# Patient Record
Sex: Female | Born: 1965 | Race: White | Hispanic: No | Smoking: Former smoker
Health system: Southern US, Community
[De-identification: ages and names within clinical notes are randomized; demographics above are authoritative.]

## PROBLEM LIST (undated history)

## (undated) DIAGNOSIS — F32A Depression, unspecified: Secondary | ICD-10-CM

## (undated) DIAGNOSIS — M1990 Unspecified osteoarthritis, unspecified site: Secondary | ICD-10-CM

## (undated) DIAGNOSIS — R7303 Prediabetes: Secondary | ICD-10-CM

## (undated) DIAGNOSIS — M549 Dorsalgia, unspecified: Secondary | ICD-10-CM

## (undated) DIAGNOSIS — M47819 Spondylosis without myelopathy or radiculopathy, site unspecified: Secondary | ICD-10-CM

## (undated) DIAGNOSIS — E882 Lipomatosis, not elsewhere classified: Secondary | ICD-10-CM

## (undated) DIAGNOSIS — M797 Fibromyalgia: Secondary | ICD-10-CM

## (undated) DIAGNOSIS — T7840XA Allergy, unspecified, initial encounter: Secondary | ICD-10-CM

## (undated) DIAGNOSIS — H269 Unspecified cataract: Secondary | ICD-10-CM

## (undated) DIAGNOSIS — F419 Anxiety disorder, unspecified: Secondary | ICD-10-CM

## (undated) DIAGNOSIS — G8929 Other chronic pain: Secondary | ICD-10-CM

## (undated) DIAGNOSIS — G43909 Migraine, unspecified, not intractable, without status migrainosus: Secondary | ICD-10-CM

## (undated) HISTORY — PX: BACK SURGERY: SHX140

## (undated) HISTORY — PX: DENTAL SURGERY: SHX609

## (undated) HISTORY — PX: SPINAL FUSION: SHX223

## (undated) HISTORY — DX: Allergy, unspecified, initial encounter: T78.40XA

## (undated) HISTORY — PX: FRACTURE SURGERY: SHX138

## (undated) HISTORY — PX: APPENDECTOMY: SHX54

## (undated) HISTORY — DX: Unspecified cataract: H26.9

## (undated) HISTORY — DX: Lipomatosis, not elsewhere classified: E88.2

## (undated) HISTORY — DX: Migraine, unspecified, not intractable, without status migrainosus: G43.909

## (undated) HISTORY — DX: Depression, unspecified: F32.A

## (undated) HISTORY — DX: Anxiety disorder, unspecified: F41.9

## (undated) HISTORY — PX: TONSILLECTOMY: SUR1361

## (undated) HISTORY — PX: CHOLECYSTECTOMY: SHX55

## (undated) HISTORY — PX: NOSE SURGERY: SHX723

## (undated) HISTORY — PX: TUMOR REMOVAL: SHX12

---
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
## 1998-06-25 ENCOUNTER — Emergency Department (HOSPITAL_COMMUNITY): Admission: EM | Admit: 1998-06-25 | Discharge: 1998-06-25 | Payer: Self-pay | Admitting: Emergency Medicine

## 2000-04-14 ENCOUNTER — Encounter: Payer: Self-pay | Admitting: *Deleted

## 2000-04-14 ENCOUNTER — Inpatient Hospital Stay (HOSPITAL_COMMUNITY): Admission: EM | Admit: 2000-04-14 | Discharge: 2000-04-16 | Payer: Self-pay | Admitting: Emergency Medicine

## 2000-04-14 ENCOUNTER — Encounter (INDEPENDENT_AMBULATORY_CARE_PROVIDER_SITE_OTHER): Payer: Self-pay | Admitting: *Deleted

## 2000-10-01 HISTORY — PX: APPENDECTOMY: SHX54

## 2000-10-01 HISTORY — PX: TUMOR REMOVAL: SHX12

## 2000-12-27 ENCOUNTER — Encounter: Payer: Self-pay | Admitting: Emergency Medicine

## 2000-12-27 ENCOUNTER — Emergency Department (HOSPITAL_COMMUNITY): Admission: EM | Admit: 2000-12-27 | Discharge: 2000-12-27 | Payer: Self-pay | Admitting: Emergency Medicine

## 2001-01-13 ENCOUNTER — Other Ambulatory Visit: Admission: RE | Admit: 2001-01-13 | Discharge: 2001-01-13 | Payer: Self-pay | Admitting: Family Medicine

## 2001-01-13 ENCOUNTER — Encounter: Payer: Self-pay | Admitting: Family Medicine

## 2001-01-13 ENCOUNTER — Encounter: Admission: RE | Admit: 2001-01-13 | Discharge: 2001-01-13 | Payer: Self-pay | Admitting: Family Medicine

## 2001-02-06 ENCOUNTER — Encounter (INDEPENDENT_AMBULATORY_CARE_PROVIDER_SITE_OTHER): Payer: Self-pay | Admitting: *Deleted

## 2001-02-06 ENCOUNTER — Ambulatory Visit (HOSPITAL_BASED_OUTPATIENT_CLINIC_OR_DEPARTMENT_OTHER): Admission: RE | Admit: 2001-02-06 | Discharge: 2001-02-06 | Payer: Self-pay | Admitting: General Surgery

## 2002-02-26 ENCOUNTER — Other Ambulatory Visit: Admission: RE | Admit: 2002-02-26 | Discharge: 2002-02-26 | Payer: Self-pay

## 2002-03-05 ENCOUNTER — Ambulatory Visit: Admission: RE | Admit: 2002-03-05 | Discharge: 2002-03-05 | Payer: Self-pay

## 2002-04-02 ENCOUNTER — Ambulatory Visit (HOSPITAL_COMMUNITY): Admission: RE | Admit: 2002-04-02 | Discharge: 2002-04-02 | Payer: Self-pay | Admitting: Obstetrics

## 2002-06-23 ENCOUNTER — Emergency Department (HOSPITAL_COMMUNITY): Admission: EM | Admit: 2002-06-23 | Discharge: 2002-06-23 | Payer: Self-pay | Admitting: Emergency Medicine

## 2002-06-23 ENCOUNTER — Encounter: Payer: Self-pay | Admitting: Emergency Medicine

## 2002-08-13 ENCOUNTER — Emergency Department (HOSPITAL_COMMUNITY): Admission: EM | Admit: 2002-08-13 | Discharge: 2002-08-13 | Payer: Self-pay | Admitting: Emergency Medicine

## 2003-02-16 ENCOUNTER — Other Ambulatory Visit: Admission: RE | Admit: 2003-02-16 | Discharge: 2003-02-16 | Payer: Self-pay

## 2003-03-09 ENCOUNTER — Encounter: Admission: RE | Admit: 2003-03-09 | Discharge: 2003-03-09 | Payer: Self-pay | Admitting: Family Medicine

## 2003-03-09 ENCOUNTER — Encounter: Payer: Self-pay | Admitting: Family Medicine

## 2003-07-20 ENCOUNTER — Other Ambulatory Visit: Admission: RE | Admit: 2003-07-20 | Discharge: 2003-07-20 | Payer: Self-pay | Admitting: Obstetrics and Gynecology

## 2003-10-02 HISTORY — PX: CHOLECYSTECTOMY: SHX55

## 2004-05-08 ENCOUNTER — Emergency Department (HOSPITAL_COMMUNITY): Admission: EM | Admit: 2004-05-08 | Discharge: 2004-05-08 | Payer: Self-pay | Admitting: Emergency Medicine

## 2005-05-14 ENCOUNTER — Inpatient Hospital Stay (HOSPITAL_COMMUNITY): Admission: EM | Admit: 2005-05-14 | Discharge: 2005-05-15 | Payer: Self-pay | Admitting: *Deleted

## 2005-05-15 ENCOUNTER — Encounter (INDEPENDENT_AMBULATORY_CARE_PROVIDER_SITE_OTHER): Payer: Self-pay | Admitting: *Deleted

## 2005-07-31 ENCOUNTER — Emergency Department (HOSPITAL_COMMUNITY): Admission: EM | Admit: 2005-07-31 | Discharge: 2005-08-01 | Payer: Self-pay | Admitting: Emergency Medicine

## 2005-10-01 HISTORY — PX: BACK SURGERY: SHX140

## 2005-10-30 ENCOUNTER — Ambulatory Visit (HOSPITAL_COMMUNITY): Admission: RE | Admit: 2005-10-30 | Discharge: 2005-10-30 | Payer: Self-pay | Admitting: Family Medicine

## 2005-11-12 ENCOUNTER — Ambulatory Visit (HOSPITAL_COMMUNITY): Admission: RE | Admit: 2005-11-12 | Discharge: 2005-11-13 | Payer: Self-pay | Admitting: Neurosurgery

## 2005-11-27 ENCOUNTER — Emergency Department (HOSPITAL_COMMUNITY): Admission: EM | Admit: 2005-11-27 | Discharge: 2005-11-27 | Payer: Self-pay | Admitting: Emergency Medicine

## 2006-03-15 ENCOUNTER — Other Ambulatory Visit: Admission: RE | Admit: 2006-03-15 | Discharge: 2006-03-15 | Payer: Self-pay | Admitting: Obstetrics and Gynecology

## 2006-04-11 ENCOUNTER — Emergency Department (HOSPITAL_COMMUNITY): Admission: EM | Admit: 2006-04-11 | Discharge: 2006-04-11 | Payer: Self-pay | Admitting: *Deleted

## 2006-04-17 ENCOUNTER — Other Ambulatory Visit: Admission: RE | Admit: 2006-04-17 | Discharge: 2006-04-17 | Payer: Self-pay | Admitting: Obstetrics and Gynecology

## 2006-10-05 ENCOUNTER — Ambulatory Visit: Payer: Self-pay | Admitting: *Deleted

## 2006-10-05 ENCOUNTER — Inpatient Hospital Stay (HOSPITAL_COMMUNITY): Admission: AD | Admit: 2006-10-05 | Discharge: 2006-10-05 | Payer: Self-pay | Admitting: *Deleted

## 2007-02-04 ENCOUNTER — Observation Stay (HOSPITAL_COMMUNITY): Admission: EM | Admit: 2007-02-04 | Discharge: 2007-02-05 | Payer: Self-pay | Admitting: Emergency Medicine

## 2007-05-26 ENCOUNTER — Other Ambulatory Visit: Admission: RE | Admit: 2007-05-26 | Discharge: 2007-05-26 | Payer: Self-pay | Admitting: Obstetrics and Gynecology

## 2007-12-25 ENCOUNTER — Emergency Department (HOSPITAL_COMMUNITY): Admission: EM | Admit: 2007-12-25 | Discharge: 2007-12-25 | Payer: Self-pay | Admitting: Emergency Medicine

## 2008-02-02 ENCOUNTER — Emergency Department (HOSPITAL_COMMUNITY): Admission: EM | Admit: 2008-02-02 | Discharge: 2008-02-02 | Payer: Self-pay | Admitting: Emergency Medicine

## 2008-02-14 ENCOUNTER — Encounter: Admission: RE | Admit: 2008-02-14 | Discharge: 2008-02-14 | Payer: Self-pay | Admitting: Family Medicine

## 2008-04-27 ENCOUNTER — Encounter: Admission: RE | Admit: 2008-04-27 | Discharge: 2008-04-27 | Payer: Self-pay | Admitting: Family Medicine

## 2009-04-05 ENCOUNTER — Emergency Department (HOSPITAL_COMMUNITY): Admission: EM | Admit: 2009-04-05 | Discharge: 2009-04-05 | Payer: Self-pay | Admitting: Emergency Medicine

## 2009-04-13 ENCOUNTER — Emergency Department (HOSPITAL_COMMUNITY): Admission: EM | Admit: 2009-04-13 | Discharge: 2009-04-13 | Payer: Self-pay | Admitting: Emergency Medicine

## 2009-06-24 ENCOUNTER — Encounter: Admission: RE | Admit: 2009-06-24 | Discharge: 2009-06-24 | Payer: Self-pay | Admitting: Obstetrics and Gynecology

## 2009-07-20 ENCOUNTER — Other Ambulatory Visit: Admission: RE | Admit: 2009-07-20 | Discharge: 2009-07-20 | Payer: Self-pay | Admitting: Obstetrics and Gynecology

## 2009-10-01 HISTORY — PX: SPINAL FUSION: SHX223

## 2009-10-05 ENCOUNTER — Encounter: Admission: RE | Admit: 2009-10-05 | Discharge: 2009-10-05 | Payer: Self-pay | Admitting: Neurosurgery

## 2009-10-19 ENCOUNTER — Inpatient Hospital Stay (HOSPITAL_COMMUNITY): Admission: RE | Admit: 2009-10-19 | Discharge: 2009-10-22 | Payer: Self-pay | Admitting: Neurosurgery

## 2009-12-20 ENCOUNTER — Ambulatory Visit: Payer: Self-pay | Admitting: Vascular Surgery

## 2009-12-20 ENCOUNTER — Encounter (INDEPENDENT_AMBULATORY_CARE_PROVIDER_SITE_OTHER): Payer: Self-pay | Admitting: Emergency Medicine

## 2009-12-20 ENCOUNTER — Emergency Department (HOSPITAL_COMMUNITY): Admission: EM | Admit: 2009-12-20 | Discharge: 2009-12-20 | Payer: Self-pay | Admitting: Emergency Medicine

## 2010-08-28 ENCOUNTER — Emergency Department (HOSPITAL_COMMUNITY): Admission: EM | Admit: 2010-08-28 | Discharge: 2010-08-28 | Payer: Self-pay | Admitting: Emergency Medicine

## 2010-10-23 ENCOUNTER — Encounter: Payer: Self-pay | Admitting: Obstetrics and Gynecology

## 2010-12-12 LAB — WET PREP, GENITAL: Yeast Wet Prep HPF POC: NONE SEEN

## 2010-12-12 LAB — URINE MICROSCOPIC-ADD ON

## 2010-12-12 LAB — RPR: RPR Ser Ql: NONREACTIVE

## 2010-12-12 LAB — URINALYSIS, ROUTINE W REFLEX MICROSCOPIC
Bilirubin Urine: NEGATIVE
Glucose, UA: NEGATIVE mg/dL
Specific Gravity, Urine: 1.016 (ref 1.005–1.030)
pH: 5.5 (ref 5.0–8.0)

## 2010-12-12 LAB — POCT PREGNANCY, URINE: Preg Test, Ur: NEGATIVE

## 2010-12-17 LAB — BASIC METABOLIC PANEL
BUN: 10 mg/dL (ref 6–23)
Chloride: 103 mEq/L (ref 96–112)
Creatinine, Ser: 0.56 mg/dL (ref 0.4–1.2)
GFR calc Af Amer: >60 mL/min (ref >60)
GFR calc non Af Amer: >60 mL/min (ref >60)
Potassium: 4.8 mEq/L (ref 3.5–5.1)

## 2010-12-17 LAB — CBC
HCT: 41.5 % (ref 36.0–46.0)
MCV: 98.4 fL (ref 78.0–100.0)
MCV: 98.5 fL (ref 78.0–100.0)
Platelets: 293 10*3/uL (ref 150–400)
RBC: 3.54 MIL/uL — ABNORMAL LOW (ref 3.87–5.11)
RBC: 4.21 MIL/uL (ref 3.87–5.11)
WBC: 10.8 10*3/uL — ABNORMAL HIGH (ref 4.0–10.5)
WBC: 9 10*3/uL (ref 4.0–10.5)

## 2010-12-17 LAB — ABO/RH: ABO/RH(D): O POS

## 2010-12-17 LAB — TYPE AND SCREEN

## 2010-12-22 LAB — CBC
HCT: 43.7 % (ref 36.0–46.0)
Hemoglobin: 15 g/dL (ref 12.0–15.0)
MCV: 97.3 fL (ref 78.0–100.0)
Platelets: 285 10*3/uL (ref 150–400)
WBC: 5.9 10*3/uL (ref 4.0–10.5)

## 2010-12-22 LAB — DIFFERENTIAL
Eosinophils Absolute: 0.1 10*3/uL (ref 0.0–0.7)
Eosinophils Relative: 2 % (ref 0–5)
Lymphocytes Relative: 31 % (ref 12–46)
Lymphs Abs: 1.9 10*3/uL (ref 0.7–4.0)
Monocytes Absolute: 0.4 10*3/uL (ref 0.1–1.0)
Monocytes Relative: 7 % (ref 3–12)

## 2011-01-07 LAB — CBC
Hemoglobin: 12.4 g/dL (ref 12.0–15.0)
MCHC: 34.5 g/dL (ref 30.0–36.0)
Platelets: 287 10*3/uL (ref 150–400)
RDW: 12.4 % (ref 11.5–15.5)

## 2011-01-07 LAB — DIFFERENTIAL
Basophils Absolute: 0 10*3/uL (ref 0.0–0.1)
Basophils Relative: 0 % (ref 0–1)
Eosinophils Relative: 1 % (ref 0–5)
Lymphocytes Relative: 32 % (ref 12–46)
Monocytes Absolute: 0.5 10*3/uL (ref 0.1–1.0)
Neutro Abs: 4.3 10*3/uL (ref 1.7–7.7)

## 2011-01-07 LAB — URINE MICROSCOPIC-ADD ON

## 2011-01-07 LAB — URINALYSIS, ROUTINE W REFLEX MICROSCOPIC
Glucose, UA: NEGATIVE mg/dL
Ketones, ur: NEGATIVE mg/dL
Nitrite: NEGATIVE
Specific Gravity, Urine: 1.041 — ABNORMAL HIGH (ref 1.005–1.030)
pH: 5 (ref 5.0–8.0)

## 2011-01-07 LAB — POCT I-STAT, CHEM 8
BUN: 13 mg/dL (ref 6–23)
Calcium, Ion: 1.17 mmol/L (ref 1.12–1.32)
Chloride: 106 mEq/L (ref 96–112)
HCT: 36 % (ref 36.0–46.0)
Sodium: 139 mEq/L (ref 135–145)
TCO2: 26 mmol/L (ref 0–100)

## 2011-01-07 LAB — POCT PREGNANCY, URINE: Preg Test, Ur: NEGATIVE

## 2011-02-16 NOTE — Op Note (Signed)
NAMENAARAH, BORGERDING    ACCOUNT NO.:  [id number]   MEDICAL RECORD NO.:  [id number]          PATIENT TYPE:  INP   LOCATION:  5727                         FACILITY:  [facility]   PHYSICIAN:  Sharlet Salina T. Hoxworth, M.D.DATE OF BIRTH:  15-Apr-1966   DATE OF PROCEDURE:  05/14/2005  DATE OF DISCHARGE:                                 OPERATIVE REPORT   PRE- AND POSTOPERATIVE DIAGNOSIS:  Cholelithiasis and acute cholecystitis.   SURGICAL PROCEDURE:  Laparoscopic cholecystectomy with intraoperative  cholangiogram.   SURGEON:  Sharlet Salina T. Hoxworth, M.D.   ASSISTANT:  Clovis Pu. Cornett, M.D.   ANESTHESIA:  General.   BRIEF HISTORY:  This patient is a 45 year old female who presents with 24  hours of persistent severe epigastric pain radiating through to her back.  Workup included gallbladder ultrasound showing multiple gallstones and  thickened gallbladder wall. She has tenderness in the epigastric and right  upper quadrant. She is felt to have acute cholecystitis. Laparoscopic  cholecystectomy with cholangiogram has been recommended and accepted. The  nature of the procedure, its indications, risks of bleeding, infection, bile  leak and bile duct injury have been discussed and understood.  The patient  is now brought out through this procedure.   DESCRIPTION OF PROCEDURE:  The patient was brought to the operating room and  placed in supine position on the operating table and general orotracheal  anesthesia was induced.  The abdomen was widely sterilely prepped and  draped. She received preoperative broad-spectrum antibiotics. PAS were in  place. Correct patient and procedure were verified. Local anesthesia was  used to infiltrate the trocar sites prior to the incisions. 1 cm incision  made at the umbilicus. Dissection carried down to the midline fascia which  was sharply incised 1 cm and the peritoneum entered under direct vision.  Through a mattress suture of Vicryl the Hasson  trocar was placed and  pneumoperitoneum established. Standard four-port technique was used. The  gallbladder was exposed and was acutely inflamed, somewhat tense and  literally packed with stones. The fundus was grasped and elevated over the  liver and infundibulum retracted inferolaterally. Fibrofatty tissue was  stripped down off the gallbladder toward the porta hepatis. The distal  gallbladder was thoroughly dissected.  Calot's triangle was skeletonized.  The cystic duct was clearly identified and dissected over about a centimeter  and the cystic duct gallbladder junction dissected 360 degrees. The cystic  artery, anterior and posterior branches were identified in Calot's triangle.  When the anatomy was clear, the cystic duct was clipped at the gallbladder  junction and operative cholangiogram obtained through the cystic duct. This  showed good filling of normal common bile duct and intrahepatic ducts with  free flow in the duodenum and no filling defects. Following this,  cholangiocatheter was removed and the cystic duct was doubly clipped  proximally and divided. Individual branches of the cystic artery were then  doubly clipped proximally, clipped distally, divided. The gallbladder was  dissected free from its bed using hook cautery.  It was placed in a  EndoCatch bag and brought out through the umbilicus. Due to the multiple  large stones, the umbilical defect had to be  extended slightly to allow  extraction of the gallbladder. This was then closed with two figure-of-eight  sutures of 0 Vicryl. The operative site was inspected and irrigated and  hemostasis assured. Trocars removed under direct vision  all CO2 evacuated. Skin incisions were closed with interrupted subcuticular  Monocryl and Steri-Strips. Sponge, needle and instrument counts were  correct.  Dry sterile dressings were applied and patient was taken to the  recovery room in good condition.      Lorne Skeens. Hoxworth,  M.D.  Electronically Signed     BTH/MEDQ  D:  05/14/2005  T:  05/15/2005  Job:  [id number]
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
## 2011-02-16 NOTE — Op Note (Signed)
[name], [name]    ACCOUNT NO.:  [id number]   MEDICAL RECORD NO.:  [id number]          PATIENT TYPE:  INP   LOCATION:  3011                         FACILITY:  [facility]   PHYSICIAN:  Cristi Loron, M.D.DATE OF BIRTH:  12-05-1965   DATE OF PROCEDURE:  11/12/2005  DATE OF DISCHARGE:  11/13/2005                                 OPERATIVE REPORT   BRIEF HISTORY:  The patient is a 45 year old white female who suffers from  severe back and left leg pain.  She has failed medical management and was  worked up with a lumbar MRI which demonstrated a herniated disc at L5-S1 on  the left.  The patient's signs, symptoms, and physical exam are consistent  with left S1 radiculopathy.  I discussed the various treatments with her  including surgery.  The patient has weighed the risks, benefits, and  alternatives of surgery and decided to proceed with a left L5-S1  microdiscectomy.   PREOPERATIVE DIAGNOSIS:  Left L5-S1 herniated nucleus pulposus, degenerative  disc disease, spinal stenosis, lumbar radiculopathy, lumbago.   POSTOPERATIVE DIAGNOSIS:  Left L5-S1 herniated nucleus pulposus,  degenerative disc disease, spinal stenosis, lumbar radiculopathy, lumbago.   PROCEDURE:  Left L5-S1 microdiscectomy using microdissection.   SURGEON:  Cristi Loron, M.D.   ASSISTANT:  None.   ANESTHESIA:  General endotracheal anesthesia.   ESTIMATED BLOOD LOSS:  50 mL.   SPECIMENS:  None.   DRAINS:  None.   COMPLICATIONS:  None.   DESCRIPTION OF PROCEDURE:  The patient was brought to the operating room by  the anesthesia team.  General endotracheal anesthesia was induced.  The  patient was then carefully turned to the prone position on the Wilson frame.  The lumbosacral  region was then prepared with Betadine scrub and Betadine  solution.  Sterile drapes were applied.  I then injected the area to be  incised with Marcaine with epinephrine solution.  I used a scalpel to make a  linear midline incision over the L5-S1 interspace.  I used electrocautery to  perform a left sided subperiosteal dissection exposing the left spinous  process and lamina of L5 and upper sacrum.  We then obtained interoperative  radiograph to confirm our location.   We then inserted the [facility] retractor for exposure and then brought the  operating microscope into the field.  Under instrument magnification and  illumination, completed the microdissection/decompression.  We used the high  speed drill to perform a left L5 laminotomy, widened the laminotomy with the  Kerrison punch, removed the left L5-S1 ligamentum flavum.  I then performed  a foraminotomy at the S1 nerve root.  I then used microdissection to free up  the nerve root from the epidural tissue and then gently retracted it  medially and withdrew the retractor.  This exposed a free fragment disc  herniation which had migrated caudally from the L5-S1 disc space on the left  and was compressing the left S1 nerve root just as it entered into the  foramen.  I removed the disc in multiple fragments.  I then inspected the L5-  S1 disc, noted it was bulging mildly at the annulus, but I did  not see any  impending herniations and there was only small annulus.  I, therefore, did  not enter into the intervertebral disc space.  I then palpated along the  ventral surface of the thecal sac along the exiting route of the left S1  nerve root and note that the neural structures were well decompressed.  We  obtained hemostasis using bipolar electrocautery.  We irrigated the wound  out with Bacitracin solution, removed the solution, removed the McCullough  retractor.  We then reapproximated the thoracolumbar fascia with interrupted  #1 Vicryl suture, the subcutaneous tissue with interrupted 2-0 Vicryl  suture, and the skin with Steri-Strips and Benzoin.  The wound was then  coated with Bacitracin ointment.  A sterile dressing was applied, the  drapes  were removed.  The patient was returned to the supine position where he was  extubated by the anesthesia team and transported to the post anesthesia care  unit in stable condition.  All sponge, instrument and needle counts were  correct at the end of the case.      Cristi Loron, M.D.  Electronically Signed     JDJ/MEDQ  D:  11/13/2005  T:  11/13/2005  Job:  [id number]

## 2011-02-16 NOTE — H&P (Signed)
[place]. [facility]  Patient:    [name]                       MRN: [id number] Adm. Date:  04/14/00 Attending:  Adolph Pollack, M.D. CC:         Dyanne Carrel, M.D.                         History and Physical  CHIEF COMPLAINT:  Abdominal pain with nausea.  HISTORY OF PRESENT ILLNESS:  This is a 45 year old female who ate at [facility] and had a chocolate almond bar at 11 p.m. last night.  She subsequently awoke at 4 a.m. with some crampy pain in the epigastric region and also in the suprapubic region.  She noted some nausea but could not vomit. She noticed that her urine was warm.  She presented to the emergency department with these complaints.  She had denied fever.  She had not had anything like this in the past.  She denies previous postprandial pains.  PAST MEDICAL HISTORY:  Low back pain.  PAST SURGICAL HISTORY:  None.  ALLERGIES:  None.  MEDICATIONS:  Flexeril p.r.n. for her low back pain.  SOCIAL HISTORY:  She is married.  She smokes a pack of cigarettes a day.  She denies alcohol use.  She works as a Designer, industrial/product.  FAMILY HISTORY:  Positive for coronary artery disease in her mother.  REVIEW OF SYSTEMS:  Cardiac:  No known heart disease or hypertension. Pulmonary:  No asthma, pneumonia or emphysema.  GI:  No peptic ulcer disease, hepatitis, colitis.  Endocrine:  No diabetes or thyroid disease.  Hematologic: No deep venous thrombosis or transfusion.  Neurologic:  No history of seizure disorder.  PHYSICAL EXAMINATION:  General:  An obese female in no acute distress, very pleasant and cooperative.  She is afebrile with normal vital signs.  Eyes: Extraocular motions intact.  Sclerae are clear.  Neck is supple without palpable mass.  Cardiovascular:  Heart demonstrates a regular rate and rhythm without murmurs, rubs, or gallops.  Respiratory:  Breath sounds are equal and clear, respirations are nonlabored.  Abdomen:   Soft with right lower quadrant tenderness and guarding.  There is mild left lower quadrant and mild right upper quadrant tenderness.  There is also some right flank type tenderness. Hypoactive bowel sounds are present.  No masses are noted.  Extremities:  No palpable edema and no noted cyanosis.  ADMISSION LABORATORY DATA:  Urinary pregnancy test is negative.  Urinalysis is negative.  CBC demonstrates [place] of 25[address] with a left shift, hemoglobin 14.1.  Lipase 25.  Electrolytes:  Within normal limits. Liver functions pending.  A CT scan of the abdomen demonstrates gallstones. There is inflammation in the pericecal area and around the appendix.  IMPRESSION:  Abdominal pain which is localized in the right lower quadrant and is most consistent with acute appendicitis.  Doubt concomitant acute cholecystitis, although this could occur as well.  PLAN: 1. IV antibiotics, and she has been given IV Unasyn. 2. Diagnostic laparoscopy with an appendectomy versus cholecystectomy.  I did explain the procedure and the risks to her including the limited bleeding, infection, accidental damage to abdominal organs and risk of the anesthetic.  She appears to understand this and agrees to proceed. DD:  04/14/00 TD:  04/14/00 Job: [id number]

## 2011-02-16 NOTE — H&P (Signed)
NAME:  NUMA, HEATWOLE    ACCOUNT NO.:  [id number]   MEDICAL RECORD NO.:  [id number]          PATIENT TYPE:  EMS   LOCATION:  MAJO                         FACILITY:  [facility]   PHYSICIAN:  Sharlet Salina T. Hoxworth, M.D.DATE OF BIRTH:  [birth year]   DATE OF ADMISSION:  05/14/2005  DATE OF DISCHARGE:                                HISTORY & PHYSICAL   CHIEF COMPLAINT:  Right upper quadrant pain.   HISTORY OF PRESENT ILLNESS:  Mrs. Tarnow-Lopresti is a 45 year old female  who awoke yesterday feeling fine.  She ate a large breakfast and about one  hour after eating breakfast, she complained of severe right upper quadrant  and epigastric pain that radiated into the scapular region. This was  associated with nausea but no vomiting.  She had a decreased appetite during  the day.  The pain, however, did not stop and escalated in intensity.  She  then presented to the emergency room.  Initially she was worked up for  cardiovascular disease and pulmonary embolism and this work-up was negative.  Gallbladder ultrasound was then performed and this revealed gallstones with  gallbladder wall thickening consistent with acute cholecystitis.  In  addition, it is important to note that the patient states she is trying to  get pregnant and her urine pregnancy is negative.   ALLERGIES:  None.   MEDICATIONS:  None.   SOCIAL HISTORY:  She has a fiance.  She has three children from her previous  marriage.  She drinks only on the weekends.  She smokes one pack per day.  She did have an episode of cocaine use two days ago.   PAST MEDICAL HISTORY:  1.  History of nasal fracture.  2.  Tonsillectomy.  3.  Appendectomy.  4.  Cystectomy.  5.  She had a back injury 10 years ago.  6.  Diverticulitis in December 2004 but this was only single episode in her      lifetime.   FAMILY HISTORY:  Mother deceased from a cerebral aneurysm.  She also had  coronary artery disease, hypertension, gallbladder disease  as well as a  blood clotting disorder.  Father is alive and well as far as she knows.   REVIEW OF SYSTEMS:  As above.  Otherwise negative.   PHYSICAL EXAMINATION:  VITAL SIGNS:  Temperature 98.2, pulse 61,  respirations 20, blood pressure 110/56.  GENERAL:  She is in mild distress.  HEENT:  Grossly normal.  No carotid or subclavian bruits.  No JVD or  thyromegaly.  Sclerae clear.  Conjunctivae normal.  Ears without drainage.  CHEST:  Clear to auscultation bilaterally.  No wheezing or rhonchi.  HEART:  Regular rate and rhythm.  No gross murmur, rub or ectopy.  ABDOMEN:  Right upper quadrant pain.  Positive Murphy's sign.  No rigidity.  EXTREMITIES:  No peripheral edema.  SKIN:  Warm and dry.  NEUROLOGIC:  Intact.  Alert and oriented x3.   LABORATORY DATA:  Lab studies confirm cholecystitis through ultrasound as  noted above.  White blood cell count is 11,500.  Her LFTs are normal.  Urine  pregnancy test is normal.  Urine drug screen does confirm the  use of  cocaine.  She did have a CT scan of the chest which showed no pulmonary  embolus or acute chest process.  Her EKG shows normal sinus rhythm, rate 67,  is normal.   ASSESSMENT/PLAN:  1.  Acute cholecystitis.  2.  Polysubstance abuse.  3.  History of diverticulitis in December 2004.  4.  Status post appendectomy, cystectomy, tonsillectomy.  5.  History of nasal fracture.  6.  Back injury 10 years ago.   PLAN:  At this point the patient is planned for cholecystectomy today by Dr.  Glenna Fellows.  Her preoperative labs and orders have been written.      Guy Franco, P.A.      Lorne Skeens. Hoxworth, M.D.  Electronically Signed    LB/MEDQ  D:  05/14/2005  T:  05/14/2005  Job:  [id number]   cc:   Bryan Lemma. Manus Gunning, M.D.  [address] [place]  [place] [address]  Fax: [phone]

## 2011-02-16 NOTE — Op Note (Signed)
[place]. [facility]  Patient:    [name], [name]                    MRN: [id number] Proc. Date: 04/14/00 Adm. Date:  [id number] Attending:  Arlis Porta CC:         Dyanne Carrel, M.D.                           Operative Report  PREOPERATIVE DIAGNOSIS:  Acute appendicitis.  POSTOPERATIVE DIAGNOSIS:  Acute appendicitis.  OPERATION:  Laparoscopic appendectomy.  SURGEON:  Adolph Pollack, M.D.  ASSISTANT:  None.  ANESTHESIA:  General.  INDICATIONS FOR OPERATION:  This 45 year old female has had worsening abdominal pain and clinical signs suggestive of acute appendicitis. She has both gallstones and early periappendiceal inflammatory changes. She is brought to the operating room.  DESCRIPTION OF OPERATION:  SHe is placed supine on the operating table and general anesthetic was administered.  The abdomen was sterilely prepped and draped.  Local anesthetic was infiltrated in the subumbilical region and incision made and carried down to the fascia bluntly.  A 1 cm incision was made in the fascia and a pursestring suture of 0 Vicryl was placed around the fascial edges.  The peritoneal cavity was entered under direct vision. A Hasson trocar was introduced into the peritoneal cavity and the pneumoperitoneum created by insufflation of CO2 gas.  Next, the laparoscope was introduced.  I placed a 5 mm trocar in the right upper quadrant and retracted some omentum and noted an inflamed appendix with fibrinous debris but no evidence of perforation.  Next, I placed a 5 mm trocar in the left lower quadrant incision.  I grasped the mesoappendix and retracted anteriorly and used the harmonic scalpel to divide the mesoappendix down to the base of the cecum.  I then amputated the appendix with the endo-GIAA stapler.  The appendix was placed in an endopouch bag and removed form the abdominal cavity.  The right lower quadrant area was copiously  irrigated. No bleeding was noted.  The fluid was evacuated.  When hemostasis was adequate, the trocars were all removed under direct vision. No bleeding noted. The subumbilical fascial defect was closed by tightening up and tying down the pursestring suture.  The skin incisions were then closed with 4-0 Monacryl subcuticular stitches followed by Steri-Strips and sterile dressings.  She tolerated the procedure well without any apparent complications and was taken to the recovery room in satisfactory condition. DD:  04/14/00 TD:  04/14/00 Job: [id number]

## 2011-02-16 NOTE — Discharge Summary (Signed)
[name], SPARLIN    ACCOUNT NO.:  [id number]   MEDICAL RECORD NO.:  [id number]          PATIENT TYPE:  INP   LOCATION:  5727                         FACILITY:  [facility]   PHYSICIAN:  Sharlet Salina T. Hoxworth, M.D.DATE OF BIRTH:  24-May-1966   DATE OF ADMISSION:  05/14/2005  DATE OF DISCHARGE:  05/15/2005                                 DISCHARGE SUMMARY   DISCHARGE DIAGNOSES:  1.  Acute cholecystitis, status post laparoscopic cholecystectomy by Dr.      Sharlet Salina T. Hoxworth on May 14, 2005.  2.  Polysubstance abuse.  3.  History of diverticulitis in December of 2004.  4.  History of back injury 10 years ago.  5.  History of nasal fracture.  6.  Status post tonsillectomy.  7.  Status post appendectomy in 2001 by Dr. Avel Peace.  8.  Cystectomy of the left rib area, 2001.  9.  Mother had a history of blood-clotting disorder.   HOSPITAL COURSE:  Ms. Viveros is a 45 year old female who was  admitted on May 14, 2005 for acute cholecystitis that was demonstrated by  ultrasound in the emergency department.  She was taken the same day to the  operating room and underwent a laparoscopic cholecystectomy with  intraoperative cholangiogram by Dr. Johna Sheriff.  The patient remained in the  hospital overnight and by the next day, was afebrile, was ambulating without  difficulty, eating without problem and urinating without problem.  At this  point, we feel like she is ready for discharge to home and we will ask her  to be seen in the office in 2 weeks.   DIET:  Low-fat diet.   ACTIVITY:  She may increase her activity slowly, may shower, no driving for  1 week, no lifting over 10 pounds for 3 weeks.  She can return to work on  May 26, 2005 and no lifting over 10 pounds until June 05, 2005.   WOUND CARE:  Clean her wound area with soap and water.   SPECIAL DISCHARGE INSTRUCTIONS:  She may utilize Tylox as needed for pain.  She is to call for a fever of greater  than 101 or any problems with her  incisions such as redness or drainage.   FOLLOWUP:  She is to follow up with Dr. Johna Sheriff in 2 weeks.      Guy Franco, P.A.      Lorne Skeens. Hoxworth, M.D.  Electronically Signed    LB/MEDQ  D:  05/15/2005  T:  05/15/2005  Job:  [id number]   cc:   Bryan Lemma. Manus Gunning, M.D.  [address] [place]  [place] [address]  Fax: [phone]

## 2011-02-16 NOTE — H&P (Signed)
NAMENAOME, BRIGANDI             ACCOUNT NO.:  [id number]   MEDICAL RECORD NO.:  [id number]          PATIENT TYPE:  IPS   LOCATION:  0306                          FACILITY:  [facility]   PHYSICIAN:  Anselm Jungling, MD  DATE OF BIRTH:  1966-03-25   DATE OF ADMISSION:  10/05/2006  DATE OF DISCHARGE:  10/05/2006                       PSYCHIATRIC ADMISSION ASSESSMENT   ADMISSION AND DISCHARGE SUMMARY:  This is a voluntary admission to the  services of Dr. Electa Sniff.  This is a 45 year old divorced white female.  She presented to the emergency department earlier this morning.  She  reported having taken 10-15 cc of cough syrup that had codeine in it,  several Lunesta and a couple of indomethacin which were recently  prescribed by Dr. __________.  Apparently, her daughter found her  difficult to arouse on the couch.  She states that she had taken  medicine over several hours due to increased back pain.  Her daughter  became quite concerned and called 9-1-1.  While in the emergency  department, the patient stated that subconsciously she could have been  attempting suicide by just trying to make the chronic back pain due to  diskectomy go away.  She states that her common-law husband of five  years left her yesterday.  She has no money or job.  She has one child  at [facility], one child at home and one child with his father.  Her  UDS was positive for marijuana.  She vehemently denied this.  She also  told her 31 year old daughter that she prefers to have treatment at the  [facility].  Her daughter was noticeably shaken by all of the  activity that surrounded the clinical appraisal of her mother's  situation.  In the emergency room, her alcohol level was less than 0.  Her urine drug screen was positive for opiates as well as marijuana and  she did have some blood in her urine.  Poison Control was notified and  basically they had no recommendations other than to observe the patient.  The  patient made it clear that she needs to be in [place] on Monday for a  court case.  During admission here, she told the nurses she had no idea  why she was here, that she needed to go and she refuses to take any  medication.  She has an upcoming appointment with a psychiatrist at the  [facility] and she prefers to keep that appointment and follow that  physician's recommendations.  She was discharged.      Mickie Leonarda Salon, P.A.-C.      Anselm Jungling, MD  Electronically Signed    MD/MEDQ  D:  10/05/2006  T:  10/05/2006  Job:  [id number]

## 2012-12-04 ENCOUNTER — Emergency Department (HOSPITAL_COMMUNITY)
Admission: EM | Admit: 2012-12-04 | Discharge: 2012-12-04 | Disposition: A | Discharge status: Home / Self Care | Payer: Self-pay | Attending: Emergency Medicine | Admitting: Emergency Medicine

## 2012-12-04 ENCOUNTER — Encounter (HOSPITAL_COMMUNITY): Payer: Self-pay

## 2012-12-04 ENCOUNTER — Emergency Department (HOSPITAL_COMMUNITY): Payer: Self-pay

## 2012-12-04 DIAGNOSIS — M542 Cervicalgia: Secondary | ICD-10-CM | POA: Insufficient documentation

## 2012-12-04 DIAGNOSIS — R209 Unspecified disturbances of skin sensation: Secondary | ICD-10-CM | POA: Insufficient documentation

## 2012-12-04 DIAGNOSIS — Z8739 Personal history of other diseases of the musculoskeletal system and connective tissue: Secondary | ICD-10-CM | POA: Insufficient documentation

## 2012-12-04 DIAGNOSIS — R5383 Other fatigue: Secondary | ICD-10-CM | POA: Insufficient documentation

## 2012-12-04 DIAGNOSIS — F17200 Nicotine dependence, unspecified, uncomplicated: Secondary | ICD-10-CM | POA: Insufficient documentation

## 2012-12-04 DIAGNOSIS — R5381 Other malaise: Secondary | ICD-10-CM | POA: Insufficient documentation

## 2012-12-04 DIAGNOSIS — G8929 Other chronic pain: Secondary | ICD-10-CM | POA: Insufficient documentation

## 2012-12-04 HISTORY — DX: Fibromyalgia: M79.7

## 2012-12-04 HISTORY — DX: Unspecified osteoarthritis, unspecified site: M19.90

## 2012-12-04 HISTORY — DX: Other chronic pain: G89.29

## 2012-12-04 HISTORY — DX: Other chronic pain: M54.9

## 2012-12-04 MED ORDER — CYCLOBENZAPRINE HCL 10 MG PO TABS: 10.0000 mg | ORAL_TABLET | Freq: Two times a day (BID) | ORAL | Status: DC | PRN

## 2012-12-04 NOTE — ED Notes (Signed)
Pt presents with mid-back pain and burning pain to L arm since appointment with MD on 2/25.  Pt reports h/o chronic back pain since 2007, reports MD pressed area on her back with onset of pain that has worsened.  Pt report she has been in the bed x 12 days.  Pt reports burning to L arm and increased weakness to L arm.  Pt denies any urinary or fecal incontinence.

## 2012-12-04 NOTE — ED Provider Notes (Signed)
History     CSN: [id number]  Arrival date & time 12/04/12  1324   First MD Initiated Contact with Patient 12/04/12 1541      No chief complaint on file.  HPI [name] is a 47 y.o. female who presents to the emergency department for concern of neck pain.  Reports a history of degenerative spinal arthritis that has required 2 back surgeries in the past at L5/S1.  Reports that 9 days PTA was being evaluated by physician for chronic neck pain and he pushed on her cervical spine.  Since then she has had a tingling sensation in L arm.  Reports that this is an intermittent electrical pain.  Reports that this encompasses all 5 fingers and has some weakness intermittently with grip strength, but not all the time.  No fevers.  No other symptoms.  Past Medical History  Diagnosis Date  . Fibromyalgia   . Chronic back pain   . Arthritis     Past Surgical History  Procedure Laterality Date  . Back surgery    . Tonsillectomy    . Appendectomy    . Cholecystectomy     Family History: Reviewed.  None pertinent.    History  Substance Use Topics  . Smoking status: Current Every Day Smoker -- 0.50 packs/day  . Smokeless tobacco: Not on file  . Alcohol Use: No    Review of Systems  Constitutional: Negative for fever and chills.  HENT: Negative for congestion, rhinorrhea, neck pain and neck stiffness.   Respiratory: Negative for cough and shortness of breath.   Cardiovascular: Negative for chest pain.  Gastrointestinal: Negative for nausea, vomiting, abdominal pain, diarrhea and abdominal distention.  Endocrine: Negative for polyuria.  Genitourinary: Negative for dysuria.  Skin: Negative for rash.  Neurological: Negative for headaches.  Psychiatric/Behavioral: Negative.   All other systems reviewed and are negative.    Allergies  Review of patient's allergies indicates no known allergies.  Home Medications  No current outpatient prescriptions on file.  BP 152/92   Pulse 96  Temp(Src) 99.8 F (37.7 C) (Oral)  Resp 22  SpO2 99%  Physical Exam  Nursing note and vitals reviewed. Constitutional: She is oriented to person, place, and time. She appears well-developed and well-nourished. No distress.  HENT:  Head: Normocephalic and atraumatic.  Right Ear: External ear normal.  Left Ear: External ear normal.  Nose: Nose normal.  Mouth/Throat: Oropharynx is clear and moist. No oropharyngeal exudate.  Eyes: EOM are normal. Pupils are equal, round, and reactive to light.  Neck: Normal range of motion. Neck supple. No tracheal deviation present.  Cardiovascular: Normal rate.   Pulmonary/Chest: Effort normal and breath sounds normal. No stridor. No respiratory distress. She has no wheezes. She has no rales.  Abdominal: Soft. She exhibits no distension. There is no tenderness. There is no rebound.  Musculoskeletal: Normal range of motion.       Cervical back: She exhibits bony tenderness.       Thoracic back: She exhibits no bony tenderness.       Lumbar back: She exhibits no bony tenderness.  Neurological: She is alert and oriented to person, place, and time.  Skin: Skin is warm and dry. She is not diaphoretic.    ED Course  Procedures  Labs Reviewed - No data to display Ct Cervical Spine Wo Contrast  12/04/2012  *RADIOLOGY REPORT*  Clinical Data: Chronic neck pain since 2007.  Pain has worsened since palpation of the neck 11/25/2012.  Left  arm burning and weakness.  CT CERVICAL SPINE WITHOUT CONTRAST  Technique:  Multidetector CT imaging of the cervical spine was performed. Multiplanar CT image reconstructions were also generated.  Comparison: CT cervical spine 02/04/2007.  Findings: Vertebral body height and alignment are normal.  There is some loss of disc space height and endplate spurring at C4-5 and C5- 6.  Central canal and foramina appear patent at each level.  No notable facet degenerative change is identified.  Mild anterior endplate spurring is  noted T2-3.  The lung apices are clear.  IMPRESSION:  1.  No acute finding. 2.  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   Original Report Authenticated By: Holley Dexter, M.D.      1. Cervical pain       MDM   [name] is a 47 y.o. female who presents to the emergency department with acute on chronic neck pain.  CT imaging done and negative for acute abnormality.  Will treat with outpatient muscle relaxers.  Flexeril provided.  Patient already has outpatient pain management and neurosurgeon following chronic spine issues.  No acute indication for spinal consult.  Patient safe for discharge.  Patient discharged.        Arloa Koh, MD 12/04/12 2312

## 2012-12-04 NOTE — ED Notes (Signed)
In to discharge patient, pt not in room. Gown on bed. No belongings left in room. Check the lobby and bathroom for patient, unable to find her.

## 2012-12-06 NOTE — ED Provider Notes (Signed)
I saw and evaluated the patient, reviewed the resident's note and I agree with the findings and plan.   Loren Racer, MD 12/06/12 [phone]

## 2014-07-18 ENCOUNTER — Emergency Department (HOSPITAL_COMMUNITY): Payer: Self-pay

## 2014-07-18 ENCOUNTER — Encounter (HOSPITAL_COMMUNITY): Payer: Self-pay | Admitting: Emergency Medicine

## 2014-07-18 ENCOUNTER — Emergency Department (HOSPITAL_COMMUNITY)
Admission: EM | Admit: 2014-07-18 | Discharge: 2014-07-18 | Disposition: A | Discharge status: Home / Self Care | Payer: Self-pay | Attending: Emergency Medicine | Admitting: Emergency Medicine

## 2014-07-18 DIAGNOSIS — G8929 Other chronic pain: Secondary | ICD-10-CM | POA: Insufficient documentation

## 2014-07-18 DIAGNOSIS — F329 Major depressive disorder, single episode, unspecified: Secondary | ICD-10-CM | POA: Insufficient documentation

## 2014-07-18 DIAGNOSIS — Z720 Tobacco use: Secondary | ICD-10-CM | POA: Insufficient documentation

## 2014-07-18 DIAGNOSIS — J209 Acute bronchitis, unspecified: Secondary | ICD-10-CM | POA: Insufficient documentation

## 2014-07-18 DIAGNOSIS — M797 Fibromyalgia: Secondary | ICD-10-CM | POA: Insufficient documentation

## 2014-07-18 DIAGNOSIS — Z79899 Other long term (current) drug therapy: Secondary | ICD-10-CM | POA: Insufficient documentation

## 2014-07-18 DIAGNOSIS — J40 Bronchitis, not specified as acute or chronic: Secondary | ICD-10-CM

## 2014-07-18 DIAGNOSIS — M1990 Unspecified osteoarthritis, unspecified site: Secondary | ICD-10-CM | POA: Insufficient documentation

## 2014-07-18 HISTORY — DX: Spondylosis without myelopathy or radiculopathy, site unspecified: M47.819

## 2014-07-18 MED ORDER — AZITHROMYCIN 250 MG PO TABS: 250.0000 mg | ORAL_TABLET | Freq: Every day | ORAL | Status: DC

## 2014-07-18 MED ORDER — ALBUTEROL SULFATE HFA 108 (90 BASE) MCG/ACT IN AERS
1.0000 | INHALATION_SPRAY | Freq: Four times a day (QID) | RESPIRATORY_TRACT | Status: DC | PRN
Start: 2014-07-18 — End: 2014-07-18
  Administered 2014-07-18: 2 via RESPIRATORY_TRACT
  Filled 2014-07-18: qty 6.7

## 2014-07-18 NOTE — ED Provider Notes (Addendum)
CSN: [id number]     Arrival date & time 07/18/14  3295 History   First MD Initiated Contact with Patient 07/18/14 0820     Chief Complaint  Patient presents with  . Cough     (Consider location/radiation/quality/duration/timing/severity/associated sxs/prior Treatment) Patient is a 48 y.o. female presenting with cough. The history is provided by the patient.  Cough Cough characteristics:  Non-productive and dry Severity:  Moderate Onset quality:  Gradual Duration:  4 weeks Timing:  Constant Progression:  Worsening Chronicity:  New Smoker: yes   Context: upper respiratory infection   Relieved by:  Nothing Worsened by:  Deep breathing Ineffective treatments:  Decongestant Associated symptoms: fever and wheezing   Associated symptoms: no chest pain, no myalgias, no rhinorrhea and no shortness of breath   Risk factors comment:  Daughter with similar   Past Medical History  Diagnosis Date  . Fibromyalgia   . Chronic back pain   . Arthritis   . Degenerative spinal arthritis    Past Surgical History  Procedure Laterality Date  . Back surgery    . Tonsillectomy    . Appendectomy    . Cholecystectomy    . Spinal fusion     No family history on file. History  Substance Use Topics  . Smoking status: Current Every Day Smoker -- 0.50 packs/day    Types: Cigarettes  . Smokeless tobacco: Not on file  . Alcohol Use: No   OB History   Grav Para Term Preterm Abortions TAB SAB Ect Mult Living                 Review of Systems  Constitutional: Positive for fever.  HENT: Negative for rhinorrhea.   Respiratory: Positive for cough and wheezing. Negative for shortness of breath.   Cardiovascular: Negative for chest pain.  Musculoskeletal: Negative for myalgias.  All other systems reviewed and are negative.     Allergies  Review of patient's allergies indicates no known allergies.  Home Medications   Prior to Admission medications   Medication Sig Start Date End Date  Taking? Authorizing Provider  DULoxetine (CYMBALTA) 30 MG capsule Take 90 mg by mouth daily.   Yes Historical Provider, MD  gabapentin (NEURONTIN) 300 MG capsule Take 300-600 mg by mouth 3 (three) times daily. Take 600 mg by mouth in the morning, take 300 mg by mouth in the afternoon and take 600 mg by mouth in the evening.   Yes Historical Provider, MD  HYDROcodone-acetaminophen (NORCO/VICODIN) 5-325 MG per tablet Take 1-2 tablets by mouth 3 (three) times daily as needed for moderate pain.    Yes Historical Provider, MD  PARoxetine (PAXIL) 10 MG tablet Take 10 mg by mouth daily.   Yes Historical Provider, MD   BP 116/67  Pulse 92  Temp(Src) 98.8 F (37.1 C) (Oral)  Resp 22  SpO2 99%  LMP 07/17/2014 Physical Exam  Nursing note and vitals reviewed. Constitutional: She is oriented to person, place, and time. She appears well-developed and well-nourished. No distress.  HENT:  Head: Normocephalic and atraumatic.  Mouth/Throat: Oropharynx is clear and moist.  Eyes: Conjunctivae and EOM are normal. Pupils are equal, round, and reactive to light.  Neck: Normal range of motion. Neck supple.  Cardiovascular: Normal rate, regular rhythm and intact distal pulses.   No murmur heard. Pulmonary/Chest: Effort normal and breath sounds normal. No respiratory distress. She has no wheezes. She has no rales.  Abdominal: Soft. She exhibits no distension. There is no tenderness. There is no  rebound and no guarding.  Musculoskeletal: Normal range of motion. She exhibits no edema and no tenderness.  Neurological: She is alert and oriented to person, place, and time.  Skin: Skin is warm and dry. No rash noted. No erythema.  Psychiatric: Her behavior is normal. She exhibits a depressed mood. She expresses no suicidal plans.    ED Course  Procedures (including critical care time) Labs Review Labs Reviewed - No data to display  Imaging Review Dg Chest 2 View  07/18/2014   CLINICAL DATA:  Cough for 1 month  with shortness of breath and chest tightness. Recent flu.  EXAM: CHEST  2 VIEW  COMPARISON:  10/19/2009  FINDINGS: The heart size and mediastinal contours are within normal limits. Both lungs are clear. The visualized skeletal structures are unremarkable.  IMPRESSION: No active cardiopulmonary disease.   Electronically Signed   By: Marin Olp M.D.   On: 07/18/2014 09:53     EKG Interpretation None      MDM   Final diagnoses:  Bronchitis    Patient with one month of coughing that is productive but not resolving. She has a history of a smoker but at this time reports last fever was 5 days ago. Patient is clear on exam currently that has wheezing at night. Chest x-ray within normal limits. Vital signs are normal. Feel the patient would benefit from an inhaler but she was given prior to discharge. Also patient has not had a tetanus vaccine for years so also there is the concern of pertussis with a lingering cough and will give a Z-Pak.    Blanchie Dessert, MD 07/18/14 1016  Blanchie Dessert, MD 07/18/14 1018

## 2014-07-18 NOTE — ED Notes (Signed)
Pt arrived to ED with c/o cough. Stated that she has been having cough since September 8th and has not been getting better. Stated that she has been having green sputum that started the end of September and now is not able to cough much up but still congested. Stated that her ribs and throat are sore from coughing. Pt has been taking her temperature at home and reports temps at high as 101.0.

## 2014-08-18 ENCOUNTER — Encounter (HOSPITAL_COMMUNITY): Payer: Self-pay | Admitting: Emergency Medicine

## 2014-08-18 ENCOUNTER — Emergency Department (HOSPITAL_COMMUNITY)
Admission: EM | Admit: 2014-08-18 | Discharge: 2014-08-19 | Disposition: A | Discharge status: Home / Self Care | Payer: Self-pay | Attending: Emergency Medicine | Admitting: Emergency Medicine

## 2014-08-18 ENCOUNTER — Emergency Department (HOSPITAL_COMMUNITY): Payer: Self-pay

## 2014-08-18 DIAGNOSIS — M79604 Pain in right leg: Secondary | ICD-10-CM | POA: Insufficient documentation

## 2014-08-18 DIAGNOSIS — R609 Edema, unspecified: Secondary | ICD-10-CM

## 2014-08-18 DIAGNOSIS — R071 Chest pain on breathing: Secondary | ICD-10-CM

## 2014-08-18 DIAGNOSIS — M7121 Synovial cyst of popliteal space [Baker], right knee: Secondary | ICD-10-CM | POA: Insufficient documentation

## 2014-08-18 DIAGNOSIS — Z3202 Encounter for pregnancy test, result negative: Secondary | ICD-10-CM | POA: Insufficient documentation

## 2014-08-18 DIAGNOSIS — Z79899 Other long term (current) drug therapy: Secondary | ICD-10-CM | POA: Insufficient documentation

## 2014-08-18 DIAGNOSIS — M797 Fibromyalgia: Secondary | ICD-10-CM | POA: Insufficient documentation

## 2014-08-18 DIAGNOSIS — G8929 Other chronic pain: Secondary | ICD-10-CM | POA: Insufficient documentation

## 2014-08-18 DIAGNOSIS — Z720 Tobacco use: Secondary | ICD-10-CM | POA: Insufficient documentation

## 2014-08-18 DIAGNOSIS — R079 Chest pain, unspecified: Secondary | ICD-10-CM | POA: Insufficient documentation

## 2014-08-18 DIAGNOSIS — R52 Pain, unspecified: Secondary | ICD-10-CM

## 2014-08-18 NOTE — ED Notes (Signed)
Pt. reports right knee pain / swelling radiating down to right lower leg , denies injury/ ambulatory , no fever or chills.

## 2014-08-19 ENCOUNTER — Emergency Department (HOSPITAL_COMMUNITY): Payer: Self-pay

## 2014-08-19 ENCOUNTER — Encounter (HOSPITAL_COMMUNITY): Payer: Self-pay | Admitting: Radiology

## 2014-08-19 DIAGNOSIS — M79604 Pain in right leg: Secondary | ICD-10-CM

## 2014-08-19 DIAGNOSIS — R609 Edema, unspecified: Secondary | ICD-10-CM

## 2014-08-19 LAB — PREGNANCY, URINE: PREG TEST UR: NEGATIVE

## 2014-08-19 LAB — CBC WITH DIFFERENTIAL/PLATELET
Basophils Absolute: 0 10*3/uL (ref 0.0–0.1)
Basophils Relative: 0 % (ref 0–1)
Eosinophils Absolute: 0.2 10*3/uL (ref 0.0–0.7)
Eosinophils Relative: 2 % (ref 0–5)
HEMATOCRIT: 42.5 % (ref 36.0–46.0)
HEMOGLOBIN: 14.3 g/dL (ref 12.0–15.0)
LYMPHS ABS: 4 10*3/uL (ref 0.7–4.0)
LYMPHS PCT: 33 % (ref 12–46)
MCH: 31.8 pg (ref 26.0–34.0)
MCHC: 33.6 g/dL (ref 30.0–36.0)
MCV: 94.4 fL (ref 78.0–100.0)
MONO ABS: 1 10*3/uL (ref 0.1–1.0)
MONOS PCT: 8 % (ref 3–12)
NEUTROS ABS: 7 10*3/uL (ref 1.7–7.7)
Neutrophils Relative %: 57 % (ref 43–77)
Platelets: 388 10*3/uL (ref 150–400)
RBC: 4.5 MIL/uL (ref 3.87–5.11)
RDW: 13.1 % (ref 11.5–15.5)
WBC: 12.1 10*3/uL — AB (ref 4.0–10.5)

## 2014-08-19 LAB — BASIC METABOLIC PANEL
Anion gap: 13 (ref 5–15)
BUN: 19 mg/dL (ref 6–23)
CHLORIDE: 99 meq/L (ref 96–112)
CO2: 25 meq/L (ref 19–32)
CREATININE: 0.68 mg/dL (ref 0.50–1.10)
Calcium: 9.8 mg/dL (ref 8.4–10.5)
GFR calc Af Amer: >90 mL/min (ref >90)
GFR calc non Af Amer: >90 mL/min (ref >90)
Glucose, Bld: 110 mg/dL — ABNORMAL HIGH (ref 70–99)
Potassium: 4.4 mEq/L (ref 3.7–5.3)
Sodium: 137 mEq/L (ref 137–147)

## 2014-08-19 LAB — I-STAT TROPONIN, ED: Troponin i, poc: 0 ng/mL (ref 0.00–0.08)

## 2014-08-19 LAB — PROTIME-INR
INR: 0.87 (ref 0.00–1.49)
PROTHROMBIN TIME: 11.9 s (ref 11.6–15.2)

## 2014-08-19 LAB — D-DIMER, QUANTITATIVE: D-Dimer, Quant: 0.86 ug/mL-FEU — ABNORMAL HIGH (ref 0.00–0.48)

## 2014-08-19 MED ORDER — HYDROMORPHONE HCL 1 MG/ML IJ SOLN
1.0000 mg | Freq: Once | INTRAMUSCULAR | Status: AC
  Administered 2014-08-19: 1 mg via INTRAVENOUS
  Filled 2014-08-19: qty 1

## 2014-08-19 MED ORDER — HYDROMORPHONE HCL 1 MG/ML IJ SOLN
1.0000 mg | Freq: Once | INTRAMUSCULAR | Status: AC
Start: 2014-08-19 — End: 2014-08-19
  Administered 2014-08-19: 1 mg via INTRAVENOUS
  Filled 2014-08-19: qty 1

## 2014-08-19 MED ORDER — IOHEXOL 350 MG/ML SOLN
100.0000 mL | Freq: Once | INTRAVENOUS | Status: AC | PRN
  Administered 2014-08-19: 100 mL via INTRAVENOUS

## 2014-08-19 MED ORDER — NICOTINE 14 MG/24HR TD PT24
14.0000 mg | MEDICATED_PATCH | Freq: Once | TRANSDERMAL | Status: DC
  Administered 2014-08-19: 14 mg via TRANSDERMAL
  Filled 2014-08-19: qty 1

## 2014-08-19 MED ORDER — HYDROCODONE-ACETAMINOPHEN 5-325 MG PO TABS
1.0000 | ORAL_TABLET | Freq: Once | ORAL | Status: AC
Start: 2014-08-19 — End: 2014-08-19
  Administered 2014-08-19: 1 via ORAL
  Filled 2014-08-19: qty 1

## 2014-08-19 NOTE — ED Notes (Addendum)
Pt stated she had fibromyalgia and her BP cuff was hurting her left arm. Was able to obtain a BP but took off because of discomfort.

## 2014-08-19 NOTE — ED Provider Notes (Signed)
CSN: [id number]     Arrival date & time 08/18/14  2252 History   First MD Initiated Contact with Patient 08/19/14 0024     Chief Complaint  Patient presents with  . Knee Pain     (Consider location/radiation/quality/duration/timing/severity/associated sxs/prior Treatment) HPI 48 year old female presents to the emergency department from home with complaint of worsening right leg pain since Saturday.  Patient reports she has history of chronic pain, arthritis and fibromyalgia.  She reports that she normally only has a few good days a week.  She reports Saturday was a good day and she was able to make dinner.  She does at that time that she had some pain in her right leg, mainly behind the right knee and calf.  Patient then was in bed for the following 3 days due to overexerting herself on Saturday.  She reports the pain has gotten steadily worse.  She reports that she has limped through the day today.  She denies any acute injury to the leg.  She reports this pain is different than her normal arthritis or foreign neuralgia pain as it is usually in bilateral joints.  Patient is a smoker.  She reports family history of DVT.  Patient reports since Saturday she has had intermittent, sharp, pinching pain to the right lateral chest area.  She denies any shortness of breath.  No prior history of DVT.  No reported fevers or chills or cough. Past Medical History  Diagnosis Date  . Fibromyalgia   . Chronic back pain   . Arthritis   . Degenerative spinal arthritis    Past Surgical History  Procedure Laterality Date  . Back surgery    . Tonsillectomy    . Appendectomy    . Cholecystectomy    . Spinal fusion     No family history on file. History  Substance Use Topics  . Smoking status: Current Every Day Smoker -- 0.50 packs/day    Types: Cigarettes  . Smokeless tobacco: Not on file  . Alcohol Use: No   OB History    No data available     Review of Systems   See History of Present Illness;  otherwise all other systems are reviewed and negative  Allergies  Seroquel  Home Medications   Prior to Admission medications   Medication Sig Start Date End Date Taking? Authorizing Provider  aspirin 325 MG tablet Take 325 mg by mouth every 6 (six) hours as needed for moderate pain or headache (for occasional use).   Yes Historical Provider, MD  diphenhydrAMINE (SOMINEX) 25 MG tablet Take 25 mg by mouth at bedtime as needed for sleep (uses occasionally).   Yes Historical Provider, MD  DULoxetine (CYMBALTA) 30 MG capsule Take 90 mg by mouth daily.   Yes Historical Provider, MD  gabapentin (NEURONTIN) 300 MG capsule Take 300-600 mg by mouth 3 (three) times daily. Take 600 mg by mouth in the morning, take 300 mg by mouth in the afternoon and take 600 mg by mouth in the evening.   Yes Historical Provider, MD  HYDROcodone-acetaminophen (NORCO/VICODIN) 5-325 MG per tablet Take 1-2 tablets by mouth 3 (three) times daily as needed for moderate pain.    Yes Historical Provider, MD  ibuprofen (ADVIL,MOTRIN) 200 MG tablet Take 200-600 mg by mouth every 6 (six) hours as needed for moderate pain (uses occasionally).   Yes Historical Provider, MD  PARoxetine (PAXIL) 10 MG tablet Take 10 mg by mouth daily.   Yes Historical Provider, MD  PARoxetine (PAXIL) 20 MG tablet Take 20 mg by mouth daily.   Yes Historical Provider, MD   BP 116/56 mmHg  Pulse 88  Temp(Src) 98.7 F (37.1 C) (Oral)  Resp 14  SpO2 96% Physical Exam  Constitutional: She is oriented to person, place, and time. She appears well-developed and well-nourished. She appears distressed.  HENT:  Head: Normocephalic and atraumatic.  Nose: Nose normal.  Mouth/Throat: Oropharynx is clear and moist.  Eyes: Conjunctivae and EOM are normal. Pupils are equal, round, and reactive to light.  Neck: Normal range of motion. Neck supple. No JVD present. No tracheal deviation present. No thyromegaly present.  Cardiovascular: Normal rate, regular rhythm,  normal heart sounds and intact distal pulses.  Exam reveals no gallop and no friction rub.   No murmur heard. Pulmonary/Chest: Effort normal and breath sounds normal. No stridor. No respiratory distress. She has no wheezes. She has no rales. She exhibits no tenderness.  Abdominal: Soft. Bowel sounds are normal. She exhibits no distension and no mass. There is no tenderness. There is no rebound and no guarding.  Musculoskeletal: Normal range of motion. She exhibits tenderness. She exhibits no edema.  Patient has tenderness to the right calf, right posterior thigh and mainly behind the right knee.  There is some fullness behind the right knee, about 4 cm that feels consistent with Baker's cyst  Lymphadenopathy:    She has no cervical adenopathy.  Neurological: She is alert and oriented to person, place, and time. She displays normal reflexes. She exhibits normal muscle tone. Coordination normal.  Skin: Skin is warm and dry. No rash noted. No erythema. No pallor.  Psychiatric: She has a normal mood and affect. Her behavior is normal. Judgment and thought content normal.  Nursing note and vitals reviewed.   ED Course  Procedures (including critical care time) Labs Review Labs Reviewed  D-DIMER, QUANTITATIVE - Abnormal; Notable for the following:    D-Dimer, Quant 0.86 (*)    All other components within normal limits  CBC WITH DIFFERENTIAL - Abnormal; Notable for the following:    WBC 12.1 (*)    All other components within normal limits  BASIC METABOLIC PANEL - Abnormal; Notable for the following:    Glucose, Bld 110 (*)    All other components within normal limits  PROTIME-INR  PREGNANCY, URINE  I-STAT TROPOININ, ED    Imaging Review Dg Chest 2 View  08/19/2014   CLINICAL DATA:  Right lateral chest pain. Right leg swelling. Bronchitis.  EXAM: CHEST  2 VIEW  COMPARISON:  07/18/2014  FINDINGS: The heart size and mediastinal contours are within normal limits. Both lungs are clear. The  visualized skeletal structures are unremarkable.  IMPRESSION: No active cardiopulmonary disease.   Electronically Signed   By: Lucienne Capers M.D.   On: 08/19/2014 01:10   Ct Angio Chest Pe W/cm &/or Wo Cm  08/19/2014   CLINICAL DATA:  Right lower leg swelling. Swelling behind the knee. Spent the last 3 days in the head. Mid chest pain and right-sided chest pain with breathing. Symptoms started this past Saturday.  EXAM: CT ANGIOGRAPHY CHEST WITH CONTRAST  TECHNIQUE: Multidetector CT imaging of the chest was performed using the standard protocol during bolus administration of intravenous contrast. Multiplanar CT image reconstructions and MIPs were obtained to evaluate the vascular anatomy.  CONTRAST:  173mL OMNIPAQUE IOHEXOL 350 MG/ML SOLN  COMPARISON:  05/14/2005  FINDINGS: Technically adequate study with good opacification of the central and segmental pulmonary arteries. No focal  filling defects. No evidence of significant pulmonary embolus.  Normal heart size. Normal caliber thoracic aorta. No dissection. Great vessel origins are patent. Esophagus is decompressed. No significant lymphadenopathy in the chest.  Evaluation of lungs is limited by respiratory motion artifact but no focal consolidation or airspace disease is suggested. No pneumothorax. No pleural effusions. Airways appear patent.  Included portions of the upper abdominal organs are grossly unremarkable. Mild degenerative changes in the thoracic spine. No destructive bone lesions.  Review of the MIP images confirms the above findings.  IMPRESSION: No evidence of significant pulmonary embolus. No evidence of active pulmonary disease.   Electronically Signed   By: Lucienne Capers M.D.   On: 08/19/2014 03:48   Dg Knee Complete 4 Views Right  08/18/2014   CLINICAL DATA:  Pain and swelling in the right knee.  EXAM: RIGHT KNEE - COMPLETE 4+ VIEW  COMPARISON:  None.  FINDINGS: There is no evidence of fracture, dislocation, or joint effusion. There  is no evidence of arthropathy or other focal bone abnormality. Soft tissues are unremarkable.  IMPRESSION: Negative.   Electronically Signed   By: Lucienne Capers M.D.   On: 08/18/2014 23:29     EKG Interpretation None       Date: 08/19/2014  Rate:94  Rhythm: normal sinus rhythm  QRS Axis: normal  Intervals: normal  ST/T Wave abnormalities: normal  Conduction Disutrbances:none  Narrative Interpretation:   Old EKG Reviewed: unchanged  Unable to review EKG in [facility] secondary to name mismatch  MDM   Final diagnoses:  Chest pain  Chest pain on respiration  Right leg pain    48 year old female with right leg pain, and mild inspiratory chest pain.  D-dimer is elevated.  Plan for CT angiogram chest to evaluate for PE.  5:54 AM CT chest negative for PE.  Patient reports she is only having dull pain in her right leg.  Given that she has a positive d-dimer and pain with swelling, will get duplex study of her right leg.   Kalman Drape, MD 08/19/14 [phone]

## 2014-08-19 NOTE — ED Notes (Signed)
Patient is unable to void at this time 

## 2014-08-19 NOTE — ED Notes (Signed)
Pt refusing to have BP cuff on at this time; states inflation is too painful with fibramalagia. Monitor placed in comfort care mode per patient request to stop beeping noise.

## 2014-08-19 NOTE — Progress Notes (Signed)
Right lower extremity venous duplex completed.  Right:  No evidence of DVT or superficial thrombosis.  There appears to be a Baker's cyst in the popliteal fossa.  Left:  Negative for DVT in the common femoral vein.

## 2014-08-19 NOTE — ED Provider Notes (Signed)
MSE was initiated and I personally evaluated the patient and placed orders (if any) at  12:51 AM on August 19, 2014.  Pt here with right leg and calf pain that started Saturday as a soreness, stating it's 6/10 constant nonradiating located behind her knee and her upper calf worse with walking and pressure to the area, and unrelieved by hydrocodone. She reports subjective swelling without erythema or warmth to the area. She endorses inspiratory chest pain and states she has a family history of DVT. She denies any recent travel or estrogen use but she does state that she has not improved in 3 days due to the pain.  On exam there is a tender area in the posterior aspect of the calf with a small nodule located just below the skin, there is no erythema or warmth and the swelling is not noted by me, but given the inspiratory chest pain and the fact that she was initially tachycardic, decided to upgrade patient to a 3 for a chest pain workup. Unfortunately this patient was here for 2 hours prior to being examined by me. Will obtain labs and transfer to acute bed.  The patient appears stable so that the remainder of the MSE may be completed by another provider.  [place], [place] 08/19/14 [place], MD 08/19/14 [phone]

## 2014-08-19 NOTE — Discharge Instructions (Signed)
Baker Cyst °A Baker cyst is a sac-like structure that forms in the back of the knee. It is filled with the same fluid that is located in your knee. This fluid lubricates the bones and cartilage of the knee and allows them to move over each other more easily. °CAUSES  °When the knee becomes injured or inflamed, increased fluid forms in the knee. When this happens, the joint lining is pushed out behind the knee and forms the Baker cyst. This cyst may also be caused by inflammation from arthritic conditions and infections. °SIGNS AND SYMPTOMS  °A Baker cyst usually has no symptoms. When the cyst is substantially enlarged: °· You may feel pressure behind the knee, stiffness in the knee, or a mass in the area behind the knee. °· You may develop pain, redness, and swelling in the calf.  This can suggest a blood clot and requires evaluation by your health care provider. °DIAGNOSIS  °A Baker cyst is most often found during an ultrasound exam. This exam may have been performed for other reasons, and the cyst was found incidentally. Sometimes an MRI is used. This picks up other problems within a joint that an ultrasound exam may not. If the Baker cyst developed immediately after an injury, X-ray exams may be used to diagnose the cyst. °TREATMENT  °The treatment depends on the cause of the cyst. Anti-inflammatory medicines and rest often will be prescribed. If the cyst is caused by a bacterial infection, antibiotic medicines may be prescribed.  °HOME CARE INSTRUCTIONS  °· If the cyst was caused by an injury, for the first 24 hours, keep the injured leg elevated on 2 pillows while lying down. °· For the first 24 hours while you are awake, apply ice to the injured area: °¨ Put ice in a plastic bag. °¨ Place a towel between your skin and the bag. °¨ Leave the ice on for 20 minutes, 2-3 times a day. °· Only take over-the-counter or prescription medicines for pain, discomfort, or fever as directed by your health care  provider. °· Only take antibiotic medicine as directed. Make sure to finish it even if you start to feel better. °MAKE SURE YOU:  °· Understand these instructions. °· Will watch your condition. °· Will get help right away if you are not doing well or get worse. °Document Released: 09/17/2005 Document Revised: 07/08/2013 Document Reviewed: 04/29/2013 °[employer]® Patient Information ©2015 [employer]. This information is not intended to replace advice given to you by your health care provider. Make sure you discuss any questions you have with your health care provider. ° °

## 2014-08-19 NOTE — ED Notes (Signed)
Pt refused to have EKG monitoring equipment and BP cuff placed again for updated vitals.

## 2015-10-11 ENCOUNTER — Other Ambulatory Visit: Payer: Self-pay | Admitting: Obstetrics and Gynecology

## 2015-10-11 ENCOUNTER — Other Ambulatory Visit (HOSPITAL_COMMUNITY)
Admission: RE | Admit: 2015-10-11 | Discharge: 2015-10-11 | Disposition: A | Discharge status: Home / Self Care | Payer: Medicare Other | Source: Ambulatory Visit | Attending: Obstetrics and Gynecology | Admitting: Obstetrics and Gynecology

## 2015-10-11 DIAGNOSIS — Z01419 Encounter for gynecological examination (general) (routine) without abnormal findings: Secondary | ICD-10-CM | POA: Diagnosis present

## 2015-10-11 DIAGNOSIS — Z1151 Encounter for screening for human papillomavirus (HPV): Secondary | ICD-10-CM | POA: Insufficient documentation

## 2015-10-13 LAB — CYTOLOGY - PAP

## 2015-10-17 ENCOUNTER — Other Ambulatory Visit: Payer: Self-pay

## 2015-10-17 DIAGNOSIS — Z1231 Encounter for screening mammogram for malignant neoplasm of breast: Secondary | ICD-10-CM

## 2015-11-08 ENCOUNTER — Ambulatory Visit
Admission: RE | Admit: 2015-11-08 | Discharge: 2015-11-08 | Disposition: A | Discharge status: Home / Self Care | Payer: Medicare Other | Source: Ambulatory Visit

## 2015-11-08 DIAGNOSIS — Z1231 Encounter for screening mammogram for malignant neoplasm of breast: Secondary | ICD-10-CM

## 2015-12-09 ENCOUNTER — Ambulatory Visit
Admission: RE | Admit: 2015-12-09 | Discharge: 2015-12-09 | Disposition: A | Discharge status: Home / Self Care | Payer: Medicare Other | Source: Ambulatory Visit | Attending: Family Medicine | Admitting: Family Medicine

## 2015-12-09 ENCOUNTER — Other Ambulatory Visit: Payer: Self-pay | Admitting: Family Medicine

## 2015-12-09 DIAGNOSIS — R52 Pain, unspecified: Secondary | ICD-10-CM

## 2016-07-01 IMAGING — CR DG CHEST 2V
2 series · 2 of 2 positions shown · non-contrast
Comparison: 10/19/2009

CLINICAL DATA: Cough for 1 month with shortness of breath and chest
tightness. Recent flu.

EXAM:
CHEST  2 VIEW

[w chest pa]
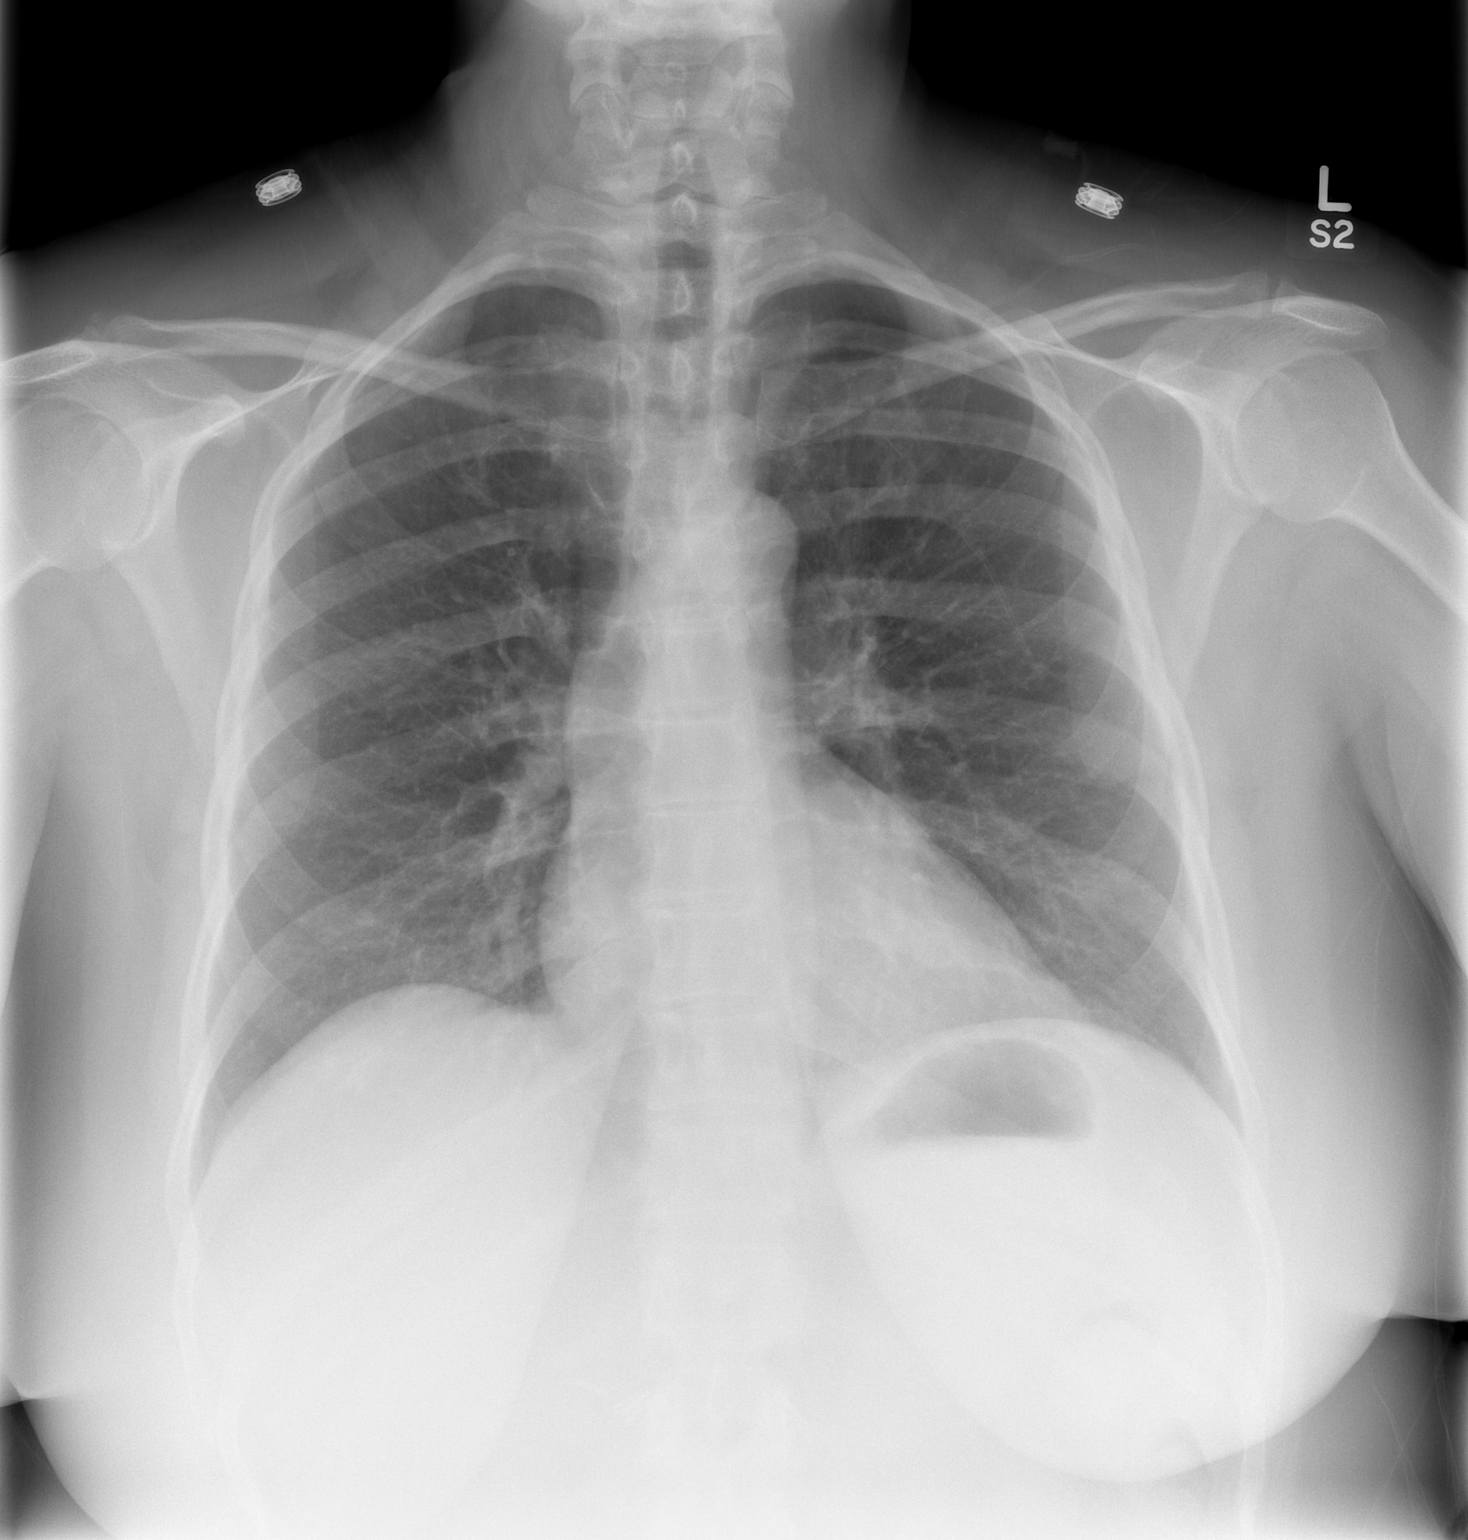

[w chest lat]
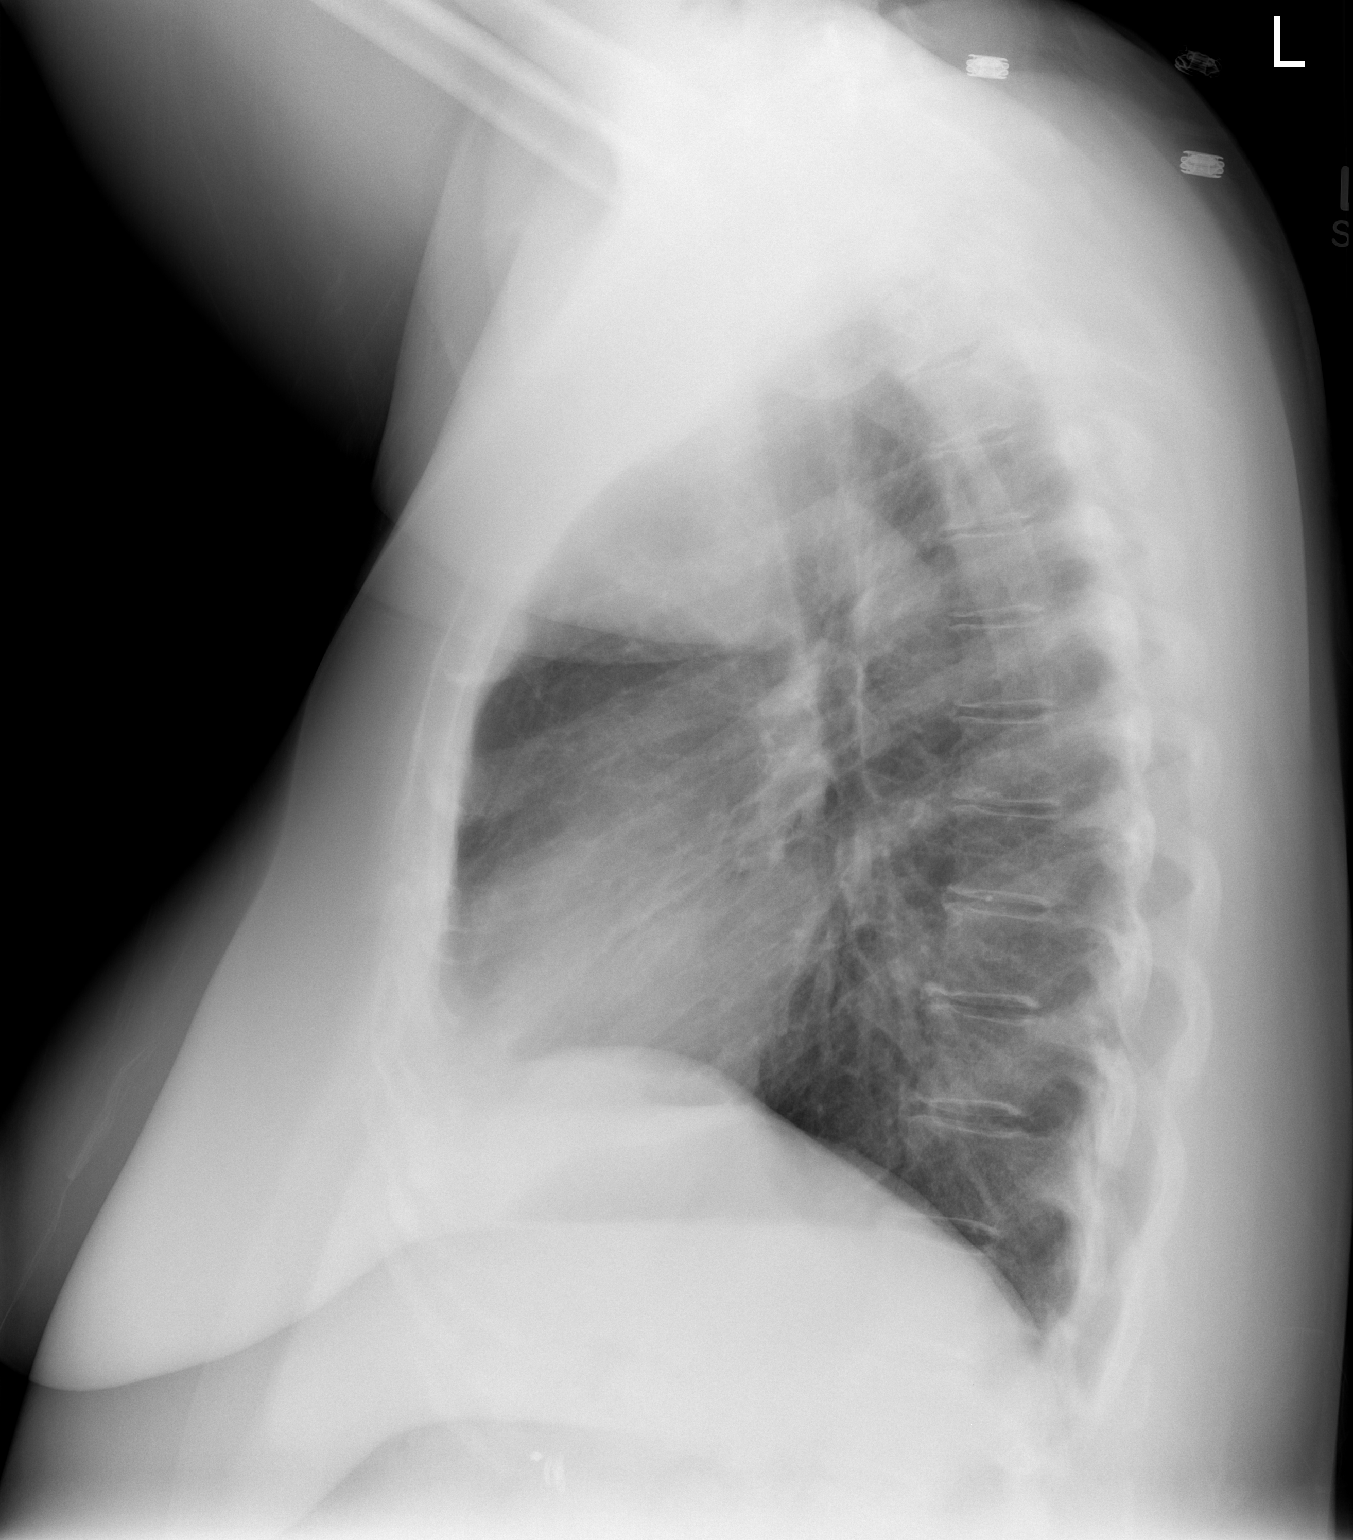

[2 of 2 positions shown; findings below may reference images not displayed]

FINDINGS: The heart size and mediastinal contours are within normal limits.
Both lungs are clear. The visualized skeletal structures are
unremarkable.
IMPRESSION: No active cardiopulmonary disease.

## 2017-10-09 DIAGNOSIS — J029 Acute pharyngitis, unspecified: Secondary | ICD-10-CM | POA: Diagnosis not present

## 2017-12-25 DIAGNOSIS — Z0000 Encounter for general adult medical examination without abnormal findings: Secondary | ICD-10-CM | POA: Diagnosis not present

## 2017-12-25 DIAGNOSIS — F322 Major depressive disorder, single episode, severe without psychotic features: Secondary | ICD-10-CM | POA: Diagnosis not present

## 2017-12-25 DIAGNOSIS — G894 Chronic pain syndrome: Secondary | ICD-10-CM | POA: Diagnosis not present

## 2017-12-25 DIAGNOSIS — M549 Dorsalgia, unspecified: Secondary | ICD-10-CM | POA: Diagnosis not present

## 2018-07-14 ENCOUNTER — Other Ambulatory Visit: Payer: Self-pay | Admitting: Family Medicine

## 2018-07-14 DIAGNOSIS — Z1231 Encounter for screening mammogram for malignant neoplasm of breast: Secondary | ICD-10-CM

## 2018-07-18 DIAGNOSIS — Z23 Encounter for immunization: Secondary | ICD-10-CM | POA: Diagnosis not present

## 2018-07-24 DIAGNOSIS — M779 Enthesopathy, unspecified: Secondary | ICD-10-CM | POA: Diagnosis not present

## 2018-08-19 ENCOUNTER — Ambulatory Visit: Payer: Medicare Other

## 2018-10-07 ENCOUNTER — Ambulatory Visit
Admission: RE | Admit: 2018-10-07 | Discharge: 2018-10-07 | Disposition: A | Discharge status: Home / Self Care | Payer: Medicare Other | Source: Ambulatory Visit | Attending: Family Medicine | Admitting: Family Medicine

## 2018-10-07 ENCOUNTER — Other Ambulatory Visit: Payer: Self-pay | Admitting: Obstetrics and Gynecology

## 2018-10-07 ENCOUNTER — Other Ambulatory Visit (HOSPITAL_COMMUNITY)
Admission: RE | Admit: 2018-10-07 | Discharge: 2018-10-07 | Disposition: A | Discharge status: Home / Self Care | Payer: Medicare Other | Source: Ambulatory Visit | Attending: Obstetrics and Gynecology | Admitting: Obstetrics and Gynecology

## 2018-10-07 DIAGNOSIS — Z1231 Encounter for screening mammogram for malignant neoplasm of breast: Secondary | ICD-10-CM

## 2018-10-07 DIAGNOSIS — Z01419 Encounter for gynecological examination (general) (routine) without abnormal findings: Secondary | ICD-10-CM | POA: Diagnosis not present

## 2018-10-08 DIAGNOSIS — H5213 Myopia, bilateral: Secondary | ICD-10-CM | POA: Diagnosis not present

## 2018-10-08 LAB — CYTOLOGY - PAP
Diagnosis: NEGATIVE
HPV: NOT DETECTED

## 2018-11-11 NOTE — Progress Notes (Deleted)
   Office Visit Note  Patient: [name]             Date of Birth: 1966/07/16           MRN: [id number]             PCP: Gaynelle Arabian, MD Referring: Gaynelle Arabian, MD Visit Date: 11/21/2018 Occupation: @GUAROCC @  Subjective:  No chief complaint on file.   History of Present Illness: [name] is a 53 y.o. female ***   Activities of Daily Living:  Patient reports morning stiffness for *** {minute/hour:19697}.   Patient {ACTIONS;DENIES/REPORTS:21021675::"Denies"} nocturnal pain.  Difficulty dressing/grooming: {ACTIONS;DENIES/REPORTS:21021675::"Denies"} Difficulty climbing stairs: {ACTIONS;DENIES/REPORTS:21021675::"Denies"} Difficulty getting out of chair: {ACTIONS;DENIES/REPORTS:21021675::"Denies"} Difficulty using hands for taps, buttons, cutlery, and/or writing: {ACTIONS;DENIES/REPORTS:21021675::"Denies"}  No Rheumatology ROS completed.   PMFS History:  There are no active problems to display for this patient.   Past Medical History:  Diagnosis Date  . Arthritis   . Chronic back pain   . Degenerative spinal arthritis   . Fibromyalgia     No family history on file. Past Surgical History:  Procedure Laterality Date  . APPENDECTOMY    . BACK SURGERY    . CHOLECYSTECTOMY    . SPINAL FUSION    . TONSILLECTOMY     Social History   Social History Narrative  . Not on file    There is no immunization history on file for this patient.   Objective: Vital Signs: There were no vitals taken for this visit.   Physical Exam   Musculoskeletal Exam: ***  CDAI Exam: CDAI Score: Not documented Patient Global Assessment: Not documented; Provider Global Assessment: Not documented Swollen: Not documented; Tender: Not documented Joint Exam   Not documented   There is currently no information documented on the homunculus. Go to the Rheumatology activity and complete the homunculus joint exam.  Investigation: No additional  findings.  Imaging: No results found.  Recent Labs: Lab Results  Component Value Date   WBC 12.1 (H) 08/19/2014   HGB 14.3 08/19/2014   PLT 388 08/19/2014   NA 137 08/19/2014   K 4.4 08/19/2014   CL 99 08/19/2014   CO2 25 08/19/2014   GLUCOSE 110 (H) 08/19/2014   BUN 19 08/19/2014   CREATININE 0.68 08/19/2014   CALCIUM 9.8 08/19/2014   GFRAA >90 08/19/2014    Speciality Comments: No specialty comments available.  Procedures:  No procedures performed Allergies: Seroquel [quetiapine fumarate]   Assessment / Plan:     Visit Diagnoses: Positive ANA (antinuclear antibody) - 2017: RNP 1.4, dsDNA1, Scl-70-, Ro-, La-, smith-, ANA+, CCP 20, CRP 17.2, CK 65, uric acid 6.2, HLAB27-, Hep panel-Previous pt of [place] [facility], U/s hands-  Fibromyalgia  Chronic pain syndrome  DDD (degenerative disc disease), lumbar  Diverticulosis  Hypercholesteremia  Dercum disease  Hx of migraines  Anxiety and depression   Orders: No orders of the defined types were placed in this encounter.  No orders of the defined types were placed in this encounter.   Face-to-face time spent with patient was *** minutes. Greater than 50% of time was spent in counseling and coordination of care.  Follow-Up Instructions: No follow-ups on file.   Ofilia Neas, PA-C  Note - This record has been created using Dragon software.  Chart creation errors have been sought, but may not always  have been located. Such creation errors do not reflect on  the standard of medical care.

## 2018-11-21 ENCOUNTER — Ambulatory Visit: Payer: Self-pay | Admitting: Rheumatology

## 2018-12-17 ENCOUNTER — Ambulatory Visit: Payer: Self-pay | Admitting: Rheumatology

## 2018-12-30 ENCOUNTER — Ambulatory Visit: Payer: Self-pay | Admitting: Rheumatology

## 2019-02-09 ENCOUNTER — Ambulatory Visit: Payer: Medicare Other | Admitting: Rheumatology
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
## 2019-02-13 ENCOUNTER — Ambulatory Visit: Payer: Medicare Other | Admitting: Rheumatology

## 2019-02-13 ENCOUNTER — Ambulatory Visit (INDEPENDENT_AMBULATORY_CARE_PROVIDER_SITE_OTHER): Payer: Medicare Other

## 2019-02-13 ENCOUNTER — Other Ambulatory Visit: Payer: Self-pay

## 2019-02-13 ENCOUNTER — Encounter: Payer: Self-pay | Admitting: Rheumatology

## 2019-02-13 ENCOUNTER — Ambulatory Visit: Payer: Self-pay

## 2019-02-13 VITALS — BP 122/80 | Resp 18 | Ht 63.5 in | Wt 232.2 lb

## 2019-02-13 DIAGNOSIS — F419 Anxiety disorder, unspecified: Secondary | ICD-10-CM

## 2019-02-13 DIAGNOSIS — M7062 Trochanteric bursitis, left hip: Secondary | ICD-10-CM

## 2019-02-13 DIAGNOSIS — G8929 Other chronic pain: Secondary | ICD-10-CM

## 2019-02-13 DIAGNOSIS — M79641 Pain in right hand: Secondary | ICD-10-CM

## 2019-02-13 DIAGNOSIS — F329 Major depressive disorder, single episode, unspecified: Secondary | ICD-10-CM

## 2019-02-13 DIAGNOSIS — M25511 Pain in right shoulder: Secondary | ICD-10-CM | POA: Diagnosis not present

## 2019-02-13 DIAGNOSIS — M25512 Pain in left shoulder: Secondary | ICD-10-CM | POA: Diagnosis not present

## 2019-02-13 DIAGNOSIS — M79642 Pain in left hand: Secondary | ICD-10-CM | POA: Diagnosis not present

## 2019-02-13 DIAGNOSIS — E7800 Pure hypercholesterolemia, unspecified: Secondary | ICD-10-CM

## 2019-02-13 DIAGNOSIS — Z8669 Personal history of other diseases of the nervous system and sense organs: Secondary | ICD-10-CM

## 2019-02-13 DIAGNOSIS — M2550 Pain in unspecified joint: Secondary | ICD-10-CM | POA: Diagnosis not present

## 2019-02-13 DIAGNOSIS — R768 Other specified abnormal immunological findings in serum: Secondary | ICD-10-CM | POA: Diagnosis not present

## 2019-02-13 DIAGNOSIS — M797 Fibromyalgia: Secondary | ICD-10-CM | POA: Insufficient documentation

## 2019-02-13 DIAGNOSIS — M7061 Trochanteric bursitis, right hip: Secondary | ICD-10-CM

## 2019-02-13 DIAGNOSIS — G894 Chronic pain syndrome: Secondary | ICD-10-CM

## 2019-02-13 DIAGNOSIS — E785 Hyperlipidemia, unspecified: Secondary | ICD-10-CM | POA: Insufficient documentation

## 2019-02-13 DIAGNOSIS — M5136 Other intervertebral disc degeneration, lumbar region: Secondary | ICD-10-CM

## 2019-02-13 DIAGNOSIS — K5730 Diverticulosis of large intestine without perforation or abscess without bleeding: Secondary | ICD-10-CM | POA: Insufficient documentation

## 2019-02-13 DIAGNOSIS — E882 Lipomatosis, not elsewhere classified: Secondary | ICD-10-CM

## 2019-02-13 DIAGNOSIS — F32A Depression, unspecified: Secondary | ICD-10-CM | POA: Insufficient documentation

## 2019-02-13 NOTE — Patient Instructions (Signed)
Shoulder Exercises Ask your health care provider which exercises are safe for you. Do exercises exactly as told by your health care provider and adjust them as directed. It is normal to feel mild stretching, pulling, tightness, or discomfort as you do these exercises, but you should stop right away if you feel sudden pain or your pain gets worse.Do not begin these exercises until told by your health care provider. Range of Motion Exercises        These exercises warm up your muscles and joints and improve the movement and flexibility of your shoulder. These exercises also help to relieve pain, numbness, and tingling. These exercises involve stretching your injured shoulder directly. Exercise A: Pendulum 1. Stand near a wall or a surface that you can hold onto for balance. 2. Bend at the waist and let your left / right arm hang straight down. Use your other arm to support you. Keep your back straight and do not lock your knees. 3. Relax your left / right arm and shoulder muscles, and move your hips and your trunk so your left / right arm swings freely. Your arm should swing because of the motion of your body, not because you are using your arm or shoulder muscles. 4. Keep moving your body so your arm swings in the following directions, as told by your health care provider: ? Side to side. ? Forward and backward. ? In clockwise and counterclockwise circles. 5. Continue each motion for __________ seconds, or for as long as told by your health care provider. 6. Slowly return to the starting position. Repeat __________ times. Complete this exercise __________ times a day. Exercise B:Flexion, Standing 1. Stand and hold a broomstick, a cane, or a similar object. Place your hands a little more than shoulder-width apart on the object. Your left / right hand should be palm-up, and your other hand should be palm-down. 2. Keep your elbow straight and keep your shoulder muscles relaxed. Push the stick  down with your healthy arm to raise your left / right arm in front of your body, and then over your head until you feel a stretch in your shoulder. ? Avoid shrugging your shoulder while you raise your arm. Keep your shoulder blade tucked down toward the middle of your back. 3. Hold for __________ seconds. 4. Slowly return to the starting position. Repeat __________ times. Complete this exercise __________ times a day. Exercise C: Abduction, Standing 1. Stand and hold a broomstick, a cane, or a similar object. Place your hands a little more than shoulder-width apart on the object. Your left / right hand should be palm-up, and your other hand should be palm-down. 2. While keeping your elbow straight and your shoulder muscles relaxed, push the stick across your body toward your left / right side. Raise your left / right arm to the side of your body and then over your head until you feel a stretch in your shoulder. ? Do not raise your arm above shoulder height, unless your health care provider tells you to do that. ? Avoid shrugging your shoulder while you raise your arm. Keep your shoulder blade tucked down toward the middle of your back. 3. Hold for __________ seconds. 4. Slowly return to the starting position. Repeat __________ times. Complete this exercise __________ times a day. Exercise D:Internal Rotation 1. Place your left / right hand behind your back, palm-up. 2. Use your other hand to dangle an exercise band, a towel, or a similar object over your shoulder.   Grasp the band with your left / right hand so you are holding onto both ends. 3. Gently pull up on the band until you feel a stretch in the front of your left / right shoulder. ? Avoid shrugging your shoulder while you raise your arm. Keep your shoulder blade tucked down toward the middle of your back. 4. Hold for __________ seconds. 5. Release the stretch by letting go of the band and lowering your hands. Repeat __________ times.  Complete this exercise __________ times a day. Stretching Exercises  These exercises warm up your muscles and joints and improve the movement and flexibility of your shoulder. These exercises also help to relieve pain, numbness, and tingling. These exercises are done using your healthy shoulder to help stretch the muscles of your injured shoulder. Exercise E: Warehouse manager (External Rotation and Abduction) 1. Stand in a doorway with one of your feet slightly in front of the other. This is called a staggered stance. If you cannot reach your forearms to the door frame, stand facing a corner of a room. 2. Choose one of the following positions as told by your health care provider: ? Place your hands and forearms on the door frame above your head. ? Place your hands and forearms on the door frame at the height of your head. ? Place your hands on the door frame at the height of your elbows. 3. Slowly move your weight onto your front foot until you feel a stretch across your chest and in the front of your shoulders. Keep your head and chest upright and keep your abdominal muscles tight. 4. Hold for __________ seconds. 5. To release the stretch, shift your weight to your back foot. Repeat __________ times. Complete this stretch __________ times a day. Exercise F:Extension, Standing 1. Stand and hold a broomstick, a cane, or a similar object behind your back. ? Your hands should be a little wider than shoulder-width apart. ? Your palms should face away from your back. 2. Keeping your elbows straight and keeping your shoulder muscles relaxed, move the stick away from your body until you feel a stretch in your shoulder. ? Avoid shrugging your shoulders while you move the stick. Keep your shoulder blade tucked down toward the middle of your back. 3. Hold for __________ seconds. 4. Slowly return to the starting position. Repeat __________ times. Complete this exercise __________ times a day.  Strengthening Exercises           These exercises build strength and endurance in your shoulder. Endurance is the ability to use your muscles for a long time, even after they get tired. Exercise G:External Rotation 1. Sit in a stable chair without armrests. 2. Secure an exercise band at elbow height on your left / right side. 3. Place a soft object, such as a folded towel or a small pillow, between your left / right upper arm and your body to move your elbow a few inches away (about 10 cm) from your side. 4. Hold the end of the band so it is tight and there is no slack. 5. Keeping your elbow pressed against the soft object, move your left / right forearm out, away from your abdomen. Keep your body steady so only your forearm moves. 6. Hold for __________ seconds. 7. Slowly return to the starting position. Repeat __________ times. Complete this exercise __________ times a day. Exercise H:Shoulder Abduction 1. Sit in a stable chair without armrests, or stand. 2. Hold a __________ weight in your  left / right hand, or hold an exercise band with both hands. 3. Start with your arms straight down and your left / right palm facing in, toward your body. 4. Slowly lift your left / right hand out to your side. Do not lift your hand above shoulder height unless your health care provider tells you that this is safe. ? Keep your arms straight. ? Avoid shrugging your shoulder while you do this movement. Keep your shoulder blade tucked down toward the middle of your back. 5. Hold for __________ seconds. 6. Slowly lower your arm, and return to the starting position. Repeat __________ times. Complete this exercise __________ times a day. Exercise I:Shoulder Extension 1. Sit in a stable chair without armrests, or stand. 2. Secure an exercise band to a stable object in front of you where it is at shoulder height. 3. Hold one end of the exercise band in each hand. Your palms should face each other. 4.  Straighten your elbows and lift your hands up to shoulder height. 5. Step back, away from the secured end of the exercise band, until the band is tight and there is no slack. 6. Squeeze your shoulder blades together as you pull your hands down to the sides of your thighs. Stop when your hands are straight down by your sides. Do not let your hands go behind your body. 7. Hold for __________ seconds. 8. Slowly return to the starting position. Repeat __________ times. Complete this exercise __________ times a day. Exercise J:Standing Shoulder Row 1. Sit in a stable chair without armrests, or stand. 2. Secure an exercise band to a stable object in front of you so it is at waist height. 3. Hold one end of the exercise band in each hand. Your palms should be in a thumbs-up position. 4. Bend each of your elbows to an "L" shape (about 90 degrees) and keep your upper arms at your sides. 5. Step back until the band is tight and there is no slack. 6. Slowly pull your elbows back behind you. 7. Hold for __________ seconds. 8. Slowly return to the starting position. Repeat __________ times. Complete this exercise __________ times a day. Exercise K:Shoulder Press-Ups 1. Sit in a stable chair that has armrests. Sit upright, with your feet flat on the floor. 2. Put your hands on the armrests so your elbows are bent and your fingers are pointing forward. Your hands should be about even with the sides of your body. 3. Push down on the armrests and use your arms to lift yourself off of the chair. Straighten your elbows and lift yourself up as much as you comfortably can. ? Move your shoulder blades down, and avoid letting your shoulders move up toward your ears. ? Keep your feet on the ground. As you get stronger, your feet should support less of your body weight as you lift yourself up. 4. Hold for __________ seconds. 5. Slowly lower yourself back into the chair. Repeat __________ times. Complete this  exercise __________ times a day. Exercise L: Wall Push-Ups 1. Stand so you are facing a stable wall. Your feet should be about one arm-length away from the wall. 2. Lean forward and place your palms on the wall at shoulder height. 3. Keep your feet flat on the floor as you bend your elbows and lean forward toward the wall. 4. Hold for __________ seconds. 5. Straighten your elbows to push yourself back to the starting position. Repeat __________ times. Complete this exercise __________ times  a day. This information is not intended to replace advice given to you by your health care provider. Make sure you discuss any questions you have with your health care provider. Document Released: 08/01/2005 Document Revised: 01/21/2018 Document Reviewed: 05/29/2015 Elsevier Interactive Patient Education  2019 [place] [facility] Ask your health care provider which exercises are safe for you. Do exercises exactly as told by your health care provider and adjust them as directed. It is normal to feel mild stretching, pulling, tightness, or discomfort as you do these exercises, but you should stop right away if you feel sudden pain or your pain gets worse.Do not begin these exercises until told by your health care provider. Stretching and range of motion exercises These exercises warm up your muscles and joints and improve the movement and flexibility of your hip and pelvis. Exercise A: Quadriceps, prone  1. Lie on your abdomen on a firm surface, such as a bed or padded floor. 2. Bend your left / right knee and hold your ankle. If you cannot reach your ankle or pant leg, loop a belt around your foot and grab the belt instead. 3. Gently pull your heel toward your buttocks. Your knee should not slide out to the side. You should feel a stretch in the front of your thigh and knee. 4. Hold this position for __________ seconds. Repeat __________ times. Complete this stretch __________ times  a day. Exercise B: Iliotibial band  1. Lie on your side with your left / right leg in the top position. 2. Bend both of your knees and grab your left / right ankle. Stretch out your bottom arm to help you balance. 3. Slowly bring your top knee back so your thigh goes behind your trunk. 4. Slowly lower your top leg toward the floor until you feel a gentle stretch on the outside of your left / right hip and thigh. If you do not feel a stretch and your knee will not fall farther, place the heel of your other foot on top of your knee and pull your knee down toward the floor with your foot. 5. Hold this position for __________ seconds. Repeat __________ times. Complete this stretch __________ times a day. Strengthening exercises These exercises build strength and endurance in your hip and pelvis. Endurance is the ability to use your muscles for a long time, even after they get tired. Exercise C: Straight leg raises (hip abductors)  1. Lie on your side with your left / right leg in the top position. Lie so your head, shoulder, knee, and hip line up. You may bend your bottom knee to help you balance. 2. Roll your hips slightly forward so your hips are stacked directly over each other and your left / right knee is facing forward. 3. Tense the muscles in your outer thigh and lift your top leg 4-6 inches (10-15 cm). 4. Hold this position for __________ seconds. 5. Slowly return to the starting position. Let your muscles relax completely before doing another repetition. Repeat __________ times. Complete this exercise __________ times a day. Exercise D: Straight leg raises (hip extensors) 1. Lie on your abdomen on your bed or a firm surface. You can put a pillow under your hips if that is more comfortable. 2. Bend your left / right knee so your foot is straight up in the air. 3. Squeeze your buttock muscles and lift your left / right thigh off the bed. Do not let your back arch. 4. Tense  this muscle as  hard as you can without increasing any knee pain. 5. Hold this position for __________ seconds. 6. Slowly lower your leg to the starting position and allow it to relax completely. Repeat __________ times. Complete this exercise __________ times a day. Exercise E: Hip hike 1. Stand sideways on a bottom step. Stand on your left / right leg with your other foot unsupported next to the step. You can hold onto the railing or wall if needed for balance. 2. Keep your knees straight and your torso square. Then, lift your left / right hip up toward the ceiling. 3. Slowly let your left / right hip lower toward the floor, past the starting position. Your foot should get closer to the floor. Do not lean or bend your knees. Repeat __________ times. Complete this exercise __________ times a day. This information is not intended to replace advice given to you by your health care provider. Make sure you discuss any questions you have with your health care provider. Document Released: 09/17/2005 Document Revised: 05/22/2016 Document Reviewed: 08/19/2015 Elsevier Interactive Patient Education  2019 [employer].

## 2019-02-25 NOTE — Progress Notes (Deleted)
Office Visit Note  Patient: [name]             Date of Birth: November 19, 1965           MRN: [id number]             PCP: Gaynelle Arabian, MD Referring: Gaynelle Arabian, MD Visit Date: 03/09/2019 Occupation: @GUAROCC @  Subjective:  No chief complaint on file.   History of Present Illness: [name] is a 53 y.o. female ***   Activities of Daily Living:  Patient reports morning stiffness for *** {minute/hour:19697}.   Patient {ACTIONS;DENIES/REPORTS:21021675::"Denies"} nocturnal pain.  Difficulty dressing/grooming: {ACTIONS;DENIES/REPORTS:21021675::"Denies"} Difficulty climbing stairs: {ACTIONS;DENIES/REPORTS:21021675::"Denies"} Difficulty getting out of chair: {ACTIONS;DENIES/REPORTS:21021675::"Denies"} Difficulty using hands for taps, buttons, cutlery, and/or writing: {ACTIONS;DENIES/REPORTS:21021675::"Denies"}  No Rheumatology ROS completed.   PMFS History:  Patient Active Problem List   Diagnosis Date Noted  . Anxiety and depression 02/13/2019  . History of migraine 02/13/2019  . Hypercholesterolemia 02/13/2019  . Diverticulosis of colon 02/13/2019  . 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 Chronic pain syndrome 02/13/2019  . Fibromyalgia 02/13/2019    Past Medical History:  Diagnosis Date  . Arthritis   . Chronic back pain   . Degenerative spinal arthritis   . Dercum's disease   . Fibromyalgia   . Migraines     Family History  Problem Relation Age of Onset  . Aneurysm Mother   . Lung cancer Father   . Heart disease Brother    Past Surgical History:  Procedure Laterality Date  . APPENDECTOMY    . BACK SURGERY    . CHOLECYSTECTOMY    . SPINAL FUSION    . TONSILLECTOMY     Social History   Social History Narrative  . Not on file    There is no immunization history on file for this patient.   Objective: Vital Signs: There were no vitals taken for this visit.   Physical Exam   Musculoskeletal Exam: ***  CDAI Exam: CDAI Score:  Not documented Patient Global Assessment: Not documented; Provider Global Assessment: Not documented Swollen: Not documented; Tender: Not documented Joint Exam   Not documented   There is currently no information documented on the homunculus. Go to the Rheumatology activity and complete the homunculus joint exam.  Investigation: No additional findings.  Imaging: Xr Hand 2 View Left  Result Date: 02/13/2019 Mild PIP and DIP narrowing was noted.  No MCP, intercarpal or radiocarpal joint space narrowing was noted.  No erosive changes were noted. 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  Xr Hand 2 View Right  Result Date: 02/13/2019 Minimal PIP and DIP narrowing was noted.  No MCP, intercarpal radiocarpal joint space narrowing was noted. 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  Xr Shoulder Left  Result Date: 02/13/2019 No glenohumeral or acromioclavicular joint space narrowing was noted.  No chondrocalcinosis was noted.  Spurring at the acromioclavicular joint was noted. Impression: Unremarkable x-ray of the shoulder joint.  Xr Shoulder Right  Result Date: 02/13/2019 No glenohumeral or acromioclavicular joint space narrowing was noted.  No chondrocalcinosis was noted.  Spurring at the acromioclavicular joint was noted. Impression: Unremarkable x-ray of the shoulder joint.   Recent Labs: Lab Results  Component Value Date   WBC 12.1 (H) 08/19/2014   HGB 14.3 08/19/2014   PLT 388 08/19/2014   NA 137 08/19/2014   K 4.4 08/19/2014   CL 99 08/19/2014   CO2 25 08/19/2014   GLUCOSE 110 (H)  08/19/2014   BUN 19 08/19/2014   CREATININE 0.68 08/19/2014   CALCIUM 9.8 08/19/2014   GFRAA >90 08/19/2014   [facility] labs: RNP 1.4, dsDNA 1, Scl-70 0.7, Ro-, La-, smith-, ANA positive, CCP 20, CRP 17.2, CK 65, uric acid 6.2,HLAB27-   Speciality Comments: No specialty comments available.  Procedures:   No procedures performed Allergies: Quetiapine fumarate and Seroquel [quetiapine fumarate]   Assessment / Plan:     Visit Diagnoses: Positive ANA (antinuclear antibody) - AVISE  Hypermobility arthralgia  Chronic pain of both shoulders - X-ray of bilateral shoulder joints were unremarkable at the last visit.  Primary osteoarthritis of both hands - Mild  DDD (degenerative disc disease), lumbar  Trochanteric bursitis of both hips  Fibromyalgia  Chronic pain syndrome  Anxiety and depression  History of migraine  Hypercholesterolemia  Diverticulosis of colon  Dercum disease   Orders: No orders of the defined types were placed in this encounter.  No orders of the defined types were placed in this encounter.   Face-to-face time spent with patient was *** minutes. Greater than 50% of time was spent in counseling and coordination of care.  Follow-Up Instructions: No follow-ups on file.   Bo Merino, MD  Note - This record has been created using Editor, commissioning.  Chart creation errors have been sought, but may not always  have been located. Such creation errors do not reflect on  the standard of medical care.

## 2019-03-03 ENCOUNTER — Ambulatory Visit: Payer: Self-pay | Admitting: Rheumatology

## 2019-03-09 ENCOUNTER — Ambulatory Visit: Payer: Medicare Other | Admitting: Rheumatology

## 2019-05-18 DIAGNOSIS — M549 Dorsalgia, unspecified: Secondary | ICD-10-CM | POA: Diagnosis not present

## 2019-05-18 DIAGNOSIS — G894 Chronic pain syndrome: Secondary | ICD-10-CM | POA: Diagnosis not present

## 2019-05-18 DIAGNOSIS — Z1389 Encounter for screening for other disorder: Secondary | ICD-10-CM | POA: Diagnosis not present

## 2019-05-18 DIAGNOSIS — Z0000 Encounter for general adult medical examination without abnormal findings: Secondary | ICD-10-CM | POA: Diagnosis not present

## 2019-05-20 DIAGNOSIS — E7800 Pure hypercholesterolemia, unspecified: Secondary | ICD-10-CM | POA: Diagnosis not present

## 2019-05-20 DIAGNOSIS — R7309 Other abnormal glucose: Secondary | ICD-10-CM | POA: Diagnosis not present

## 2019-06-10 ENCOUNTER — Encounter: Payer: Self-pay | Admitting: Cardiology

## 2019-06-11 ENCOUNTER — Encounter: Payer: Self-pay | Admitting: Cardiology

## 2019-06-11 ENCOUNTER — Other Ambulatory Visit: Payer: Self-pay

## 2019-06-11 ENCOUNTER — Ambulatory Visit (INDEPENDENT_AMBULATORY_CARE_PROVIDER_SITE_OTHER): Payer: Medicare Other | Admitting: Cardiology

## 2019-06-11 VITALS — BP 134/80 | HR 103 | Ht 63.0 in | Wt 232.0 lb

## 2019-06-11 DIAGNOSIS — R0609 Other forms of dyspnea: Secondary | ICD-10-CM | POA: Diagnosis not present

## 2019-06-11 DIAGNOSIS — Z8249 Family history of ischemic heart disease and other diseases of the circulatory system: Secondary | ICD-10-CM

## 2019-06-11 DIAGNOSIS — R0600 Dyspnea, unspecified: Secondary | ICD-10-CM

## 2019-06-11 NOTE — Progress Notes (Signed)
Patient referred by Gaynelle Arabian, MD for dyspnea on exertion  Subjective:   [name], female    DOB: 1966/08/24, 53 y.o.   MRN: [id number]   Chief Complaint  Patient presents with  . DOE  . New Patient (Initial Visit)    HPI  53 y.o. Caucasian female with obesity, chronic pain, fibromyalgia, family h/o premature CAD, referred for evaluation of dyspnea on exertion.  Patient states that she has been obese all her life, but now has worsening dyspnea on exertion with minimal activities such as walking to the mailbox. She denies chest pain. She has had chronic pain for many years. 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 Patient has strong family h/o early CAD, with her brother having had CABG at age 8 and fatal MI at age 6. Patient is very concerned about the possibility of her having CAD.  Past Medical History:  Diagnosis Date  . Arthritis   . Chronic back pain   . Degenerative spinal arthritis   . Dercum's disease   . Fibromyalgia   . Migraines      Past Surgical History:  Procedure Laterality Date  . APPENDECTOMY    . BACK SURGERY    . CHOLECYSTECTOMY    . SPINAL FUSION    . TONSILLECTOMY       Social History   Socioeconomic History  . Marital status: Divorced    Spouse name: Not on file  . Number of children: 3  . Years of education: Not on file  . Highest education level: Not on file  Occupational History  . Not on file  Social Needs  . Financial resource strain: Not on file  . Food insecurity    Worry: Not on file    Inability: Not on file  . Transportation needs    Medical: Not on file    Non-medical: Not on file  Tobacco Use  . Smoking status: Current Some Day Smoker    Packs/day: 0.50    Years: 23.00    Pack years: 11.50    Types: Cigarettes    Start date: 31  . Smokeless tobacco: Never Used  Substance and  Sexual Activity  . Alcohol use: No  . Drug use: No  . Sexual activity: Not on file  Lifestyle  . Physical activity    Days per week: Not on file    Minutes per session: Not on file  . Stress: Not on file  Relationships  . Social Herbalist on phone: Not on file    Gets together: Not on file    Attends religious service: Not on file    Active member of club or organization: Not on file    Attends meetings of clubs or organizations: Not on file    Relationship status: Not on file  . Intimate partner violence    Fear of current or ex partner: Not on file    Emotionally abused: Not on file    Physically abused: Not on file    Forced sexual activity: Not on file  Other Topics Concern  . Not on file  Social History Narrative  . Not on file     Family History  Problem Relation Age of Onset  . Aneurysm Mother   . Lung cancer Father   . Heart disease Brother   . Heart disease Brother  Current Outpatient Medications on File Prior to Visit  Medication Sig Dispense Refill  . amphetamine-dextroamphetamine (ADDERALL) 20 MG tablet     . Artificial Saliva (BIOTENE DRY MOUTH) GUM Use as directed in the mouth or throat.    Marland Kitchen aspirin 325 MG tablet Take 325 mg by mouth every 6 (six) hours as needed for moderate pain or headache (for occasional use).    . DULoxetine (CYMBALTA) 30 MG capsule Take 90 mg by mouth daily.    Marland Kitchen EUCALYPTUS OIL EX Apply topically.    . fluticasone (FLONASE) 50 MCG/ACT nasal spray Place into both nostrils daily.    Marland Kitchen gabapentin (NEURONTIN) 600 MG tablet TAKE TWO TABLETS BY MOUTH 3 TIMES A DAY    . HYDROcodone-acetaminophen (NORCO/VICODIN) 5-325 MG per tablet Take 1-2 tablets by mouth 3 (three) times daily as needed for moderate pain.     Marland Kitchen ibuprofen (ADVIL) 800 MG tablet 1 TABLET UP TO THREE TIMES A DAY AS NEEDED FOR PAIN ORALLY 20    . Melatonin 3 MG TABS Take by mouth.     No current facility-administered medications on file prior to visit.      Cardiovascular studies:  EKG 06/11/2019: Sinus rhythm 92 bpm Normal EKG   Recent labs: 05/20/2019: Glucose 101. BUN/Cr 18/0.64. eGFR normal. Na/K 142/5.1. Chol 188, TG 258, HDL 48, LDL 89.  HbA1C 5.8%   Review of Systems  Constitution: Positive for malaise/fatigue. Negative for decreased appetite, weight gain and weight loss.       Generalized chronic pain  HENT: Negative for congestion.   Eyes: Negative for visual disturbance.  Cardiovascular: Positive for dyspnea on exertion. Negative for chest pain, leg swelling, palpitations and syncope.  Respiratory: Positive for shortness of breath. Negative for cough.   Endocrine: Negative for cold intolerance.  Hematologic/Lymphatic: Does not bruise/bleed easily.  Skin: Negative for itching and rash.  Musculoskeletal: Negative for myalgias.  Gastrointestinal: Negative for abdominal pain, nausea and vomiting.  Genitourinary: Negative for dysuria.  Neurological: Negative for dizziness and weakness.  Psychiatric/Behavioral: The patient is not nervous/anxious.   All other systems reviewed and are negative.        Vitals:   06/11/19 1402  BP: 134/80  Pulse: (!) 103  SpO2: 99%     Body mass index is 41.1 kg/m. Filed Weights   06/11/19 1402  Weight: 232 lb (105.2 kg)     Objective:   Physical Exam  Constitutional: She is oriented to person, place, and time. She appears well-developed and well-nourished. No distress.  Morbidly obese  HENT:  Head: Normocephalic and atraumatic.  Eyes: Pupils are equal, round, and reactive to light. Conjunctivae are normal.  Neck: No JVD present.  Cardiovascular: Normal rate, regular rhythm and intact distal pulses.  Pulmonary/Chest: Effort normal and breath sounds normal. She has no wheezes. She has no rales.  Abdominal: Soft. Bowel sounds are normal. There is no rebound.  Musculoskeletal:        General: No edema.  Lymphadenopathy:    She has no cervical adenopathy.  Neurological: She  is alert and oriented to person, place, and time. No cranial nerve deficit.  Skin: Skin is warm and dry.  Psychiatric: She has a normal mood and affect.  Nursing note and vitals reviewed.         Assessment & Recommendations:   53 y.o. Caucasian female with obesity, chronic pain, fibromyalgia, family h/o premature CAD, referred for evaluation of dyspnea on exertion.  Dyspnea on exertion: Multifactorial, including obesity,deconditioning. However,  obstructive CAD needs to be excluded given her strong family history. Recommend echocardiogram and exercise nuclear stress test.  Strongly suspect depression given her symptoms of not being able to move for "2-3 days" at once due to generalized pain. Defer to Dr. Marisue Humble.   Thank you for referring the patient to [place]. Please feel free to contact with any questions.  Nigel Mormon, MD [facility] Pager: [phone] Office: [phone] If no answer Cell [phone]

## 2019-06-12 ENCOUNTER — Encounter: Payer: Self-pay | Admitting: Cardiology

## 2019-06-12 DIAGNOSIS — R0600 Dyspnea, unspecified: Secondary | ICD-10-CM | POA: Insufficient documentation

## 2019-06-12 DIAGNOSIS — R0609 Other forms of dyspnea: Secondary | ICD-10-CM | POA: Insufficient documentation

## 2019-06-12 DIAGNOSIS — Z8249 Family history of ischemic heart disease and other diseases of the circulatory system: Secondary | ICD-10-CM | POA: Insufficient documentation

## 2019-06-22 ENCOUNTER — Other Ambulatory Visit: Payer: Medicaid Other

## 2019-06-25 ENCOUNTER — Other Ambulatory Visit: Payer: Medicaid Other

## 2019-07-08 ENCOUNTER — Telehealth: Payer: Medicaid Other | Admitting: Cardiology

## 2019-07-10 ENCOUNTER — Ambulatory Visit (INDEPENDENT_AMBULATORY_CARE_PROVIDER_SITE_OTHER): Payer: Medicare Other

## 2019-07-10 ENCOUNTER — Other Ambulatory Visit: Payer: Self-pay

## 2019-07-10 DIAGNOSIS — R0609 Other forms of dyspnea: Secondary | ICD-10-CM

## 2019-07-10 DIAGNOSIS — R0600 Dyspnea, unspecified: Secondary | ICD-10-CM

## 2019-07-20 ENCOUNTER — Ambulatory Visit (INDEPENDENT_AMBULATORY_CARE_PROVIDER_SITE_OTHER): Payer: Medicare Other

## 2019-07-20 ENCOUNTER — Other Ambulatory Visit: Payer: Self-pay

## 2019-07-20 DIAGNOSIS — R0609 Other forms of dyspnea: Secondary | ICD-10-CM

## 2019-07-20 DIAGNOSIS — R0600 Dyspnea, unspecified: Secondary | ICD-10-CM

## 2019-07-22 DIAGNOSIS — M797 Fibromyalgia: Secondary | ICD-10-CM | POA: Diagnosis not present

## 2019-07-22 DIAGNOSIS — R0789 Other chest pain: Secondary | ICD-10-CM | POA: Diagnosis not present

## 2019-07-22 DIAGNOSIS — E882 Lipomatosis, not elsewhere classified: Secondary | ICD-10-CM | POA: Diagnosis not present

## 2019-07-22 DIAGNOSIS — F331 Major depressive disorder, recurrent, moderate: Secondary | ICD-10-CM | POA: Diagnosis not present

## 2019-07-22 NOTE — Progress Notes (Signed)
Patient referred by Gaynelle Arabian, MD for dyspnea on exertion  Subjective:   [name], female    DOB: Jan 06, 1966, 53 y.o.   MRN: [id number]  I connected with the patient on 07/29/2019 by a telephone call and verified that I am speaking with the correct person using two identifiers.     I offered the patient a video enabled application for a virtual visit. Unfortunately, this could not be accomplished due to technical difficulties/lack of video enabled phone/computer. I discussed the limitations of evaluation and management by telemedicine and the availability of in person appointments. The patient expressed understanding and agreed to proceed.   This visit type was conducted due to national recommendations for restrictions regarding the COVID-19 Pandemic (e.g. social distancing).  This format is felt to be most appropriate for this patient at this time.  All issues noted in this document were discussed and addressed.  No physical exam was performed (except for noted visual exam findings with Tele health visits).  The patient has consented to conduct a Tele health visit and understands insurance will be billed.   Chief Complaint  Patient presents with  . Shortness of Breath  . Follow-up  . Results    nuc and echo    HPI  53 y.o. Caucasian female with obesity, chronic pain, fibromyalgia, family h/o premature CAD, referred for evaluation of dyspnea on exertion.  Stress test showed breast tissue attenuation in inferior wall, but no clear evidence of ischemia or scar.  Patient had low exercise capacity without any EKG changes suggesting ischemia.  Echocardiogram showed normal EF, grade 1 diastolic dysfunction, no significant valvular abnormality.  There was a possible small circumferential pericardial effusion with no hemodynamic significance.   Past Medical History:  Diagnosis Date  . Arthritis   . Chronic back pain   . Degenerative spinal arthritis   . Dercum's  disease   . Fibromyalgia   . Migraines      Past Surgical History:  Procedure Laterality Date  . APPENDECTOMY    . BACK SURGERY    . CHOLECYSTECTOMY    . SPINAL FUSION    . TONSILLECTOMY       Social History   Socioeconomic History  . Marital status: Divorced    Spouse name: Not on file  . Number of children: 3  . Years of education: Not on file  . Highest education level: Not on file  Occupational History  . Not on file  Social Needs  . Financial resource strain: Not on file  . Food insecurity    Worry: Not on file    Inability: Not on file  . Transportation needs    Medical: Not on file    Non-medical: Not on file  Tobacco Use  . Smoking status: Current Some Day Smoker    Packs/day: 0.50    Years: 23.00    Pack years: 11.50    Types: Cigarettes    Start date: 35  . Smokeless tobacco: Never Used  Substance and Sexual Activity  . Alcohol use: No  . Drug use: No  . Sexual activity: Not on file  Lifestyle  . Physical activity    Days per week: Not on file    Minutes per session: Not on file  . Stress: Not on file  Relationships  . Social Herbalist on phone: Not on file    Gets together: Not on file    Attends religious service: Not on file  Active member of club or organization: Not on file    Attends meetings of clubs or organizations: Not on file    Relationship status: Not on file  . Intimate partner violence    Fear of current or ex partner: Not on file    Emotionally abused: Not on file    Physically abused: Not on file    Forced sexual activity: Not on file  Other Topics Concern  . Not on file  Social History Narrative  . Not on file     Family History  Problem Relation Age of Onset  . Aneurysm Mother   . Lung cancer Father   . Heart disease Brother        CABG at 42, fatal MI at 34  . Heart disease Brother      Current Outpatient Medications on File Prior to Visit  Medication Sig Dispense Refill  .  amphetamine-dextroamphetamine (ADDERALL) 20 MG tablet     . Artificial Saliva (BIOTENE DRY MOUTH) GUM Use as directed in the mouth or throat.    Marland Kitchen aspirin 325 MG tablet Take 325 mg by mouth every 6 (six) hours as needed for moderate pain or headache (for occasional use).    . DULoxetine (CYMBALTA) 30 MG capsule Take 90 mg by mouth daily.    Marland Kitchen EUCALYPTUS OIL EX Apply topically.    . fluticasone (FLONASE) 50 MCG/ACT nasal spray Place into both nostrils daily.    Marland Kitchen gabapentin (NEURONTIN) 600 MG tablet TAKE TWO TABLETS BY MOUTH 3 TIMES A DAY    . HYDROcodone-acetaminophen (NORCO/VICODIN) 5-325 MG per tablet Take 1-2 tablets by mouth 3 (three) times daily as needed for moderate pain.     Marland Kitchen ibuprofen (ADVIL) 800 MG tablet 1 TABLET UP TO THREE TIMES A DAY AS NEEDED FOR PAIN ORALLY 20    . Melatonin 3 MG TABS Take by mouth.     No current facility-administered medications on file prior to visit.     Cardiovascular studies:  Exercise myoview stress test  07/20/2019: 1. Patient exercised on the Bruce protocol for a total of 4: 16 Min, achieving approximately 6.16 METs and 92% of maximum predicted heart rate. Normal BP response.  Resting EKG negative for myocardial ischemia.  During exercise the patient exhibited dizziness.  2. Perfusion images reveal breast tissue attenuation in the inferior wall( patient scan sitting) but no evidence of ischemia or scar.  Normal wall motion with LVEF 67%.  Low risk study.  No prior studies for comparison.  Echocardiogram 07/10/2019: Left ventricle cavity is normal in size. Mild concentric hypertrophy of the left ventricle. Normal LV systolic function with EF 58%. Normal global wall motion. Doppler evidence of grade I (impaired) diastolic dysfunction, normal LAP.  No significant valvular abnormality.  Small circumferential pericardial effusion with no hemodynamic significance.  EKG 06/11/2019: Sinus rhythm 92 bpm Normal EKG  Recent labs: 05/20/2019: Glucose 101.  BUN/Cr 18/0.64. eGFR normal. Na/K 142/5.1. Chol 188, TG 258, HDL 48, LDL 89.  HbA1C 5.8%   Review of Systems  Constitution: Positive for malaise/fatigue. Negative for decreased appetite, weight gain and weight loss.       Generalized chronic pain  HENT: Negative for congestion.   Eyes: Negative for visual disturbance.  Cardiovascular: Positive for dyspnea on exertion. Negative for chest pain, leg swelling, palpitations and syncope.  Respiratory: Positive for shortness of breath. Negative for cough.   Endocrine: Negative for cold intolerance.  Hematologic/Lymphatic: Does not bruise/bleed easily.  Skin: Negative for itching  and rash.  Musculoskeletal: Negative for myalgias.  Gastrointestinal: Negative for abdominal pain, nausea and vomiting.  Genitourinary: Negative for dysuria.  Neurological: Negative for dizziness and weakness.  Psychiatric/Behavioral: The patient is not nervous/anxious.   All other systems reviewed and are negative.       Vitals not available.  Body mass index is 41.1 kg/m. Filed Weights   07/29/19 1350  Weight: 105.2 kg     Objective:     Physical exam not performed. Telephone visit.     Assessment & Recommendations:   53 y.o. Caucasian female with obesity, chronic pain, fibromyalgia, family h/o premature CAD, referred for evaluation of dyspnea on exertion.  Dyspnea on exertion: Multifactorial, including obesity,deconditioning. Low suspicion of obstructive CAD, based on the above testing.  Recommend continued efforts on weight loss.  Nigel Mormon, MD [facility] Pager: [phone] Office: [phone] If no answer Cell [phone]

## 2019-07-26 DIAGNOSIS — Z20828 Contact with and (suspected) exposure to other viral communicable diseases: Secondary | ICD-10-CM | POA: Diagnosis not present

## 2019-07-29 ENCOUNTER — Telehealth: Payer: Medicare Other | Admitting: Cardiology

## 2019-07-29 ENCOUNTER — Other Ambulatory Visit: Payer: Self-pay

## 2019-07-29 VITALS — Ht 63.0 in | Wt 232.0 lb

## 2019-07-29 DIAGNOSIS — R0600 Dyspnea, unspecified: Secondary | ICD-10-CM | POA: Diagnosis not present

## 2019-07-29 DIAGNOSIS — Z8249 Family history of ischemic heart disease and other diseases of the circulatory system: Secondary | ICD-10-CM | POA: Diagnosis not present

## 2019-07-29 DIAGNOSIS — R0609 Other forms of dyspnea: Secondary | ICD-10-CM

## 2019-08-11 ENCOUNTER — Ambulatory Visit
Admission: RE | Admit: 2019-08-11 | Discharge: 2019-08-11 | Disposition: A | Discharge status: Home / Self Care | Payer: Medicare Other | Source: Ambulatory Visit | Attending: Family Medicine | Admitting: Family Medicine

## 2019-08-11 ENCOUNTER — Other Ambulatory Visit: Payer: Self-pay | Admitting: Family Medicine

## 2019-08-11 DIAGNOSIS — R0789 Other chest pain: Secondary | ICD-10-CM

## 2020-01-22 ENCOUNTER — Other Ambulatory Visit: Payer: Self-pay | Admitting: Family Medicine

## 2020-01-22 ENCOUNTER — Other Ambulatory Visit: Payer: Self-pay | Admitting: Obstetrics and Gynecology

## 2020-01-22 DIAGNOSIS — Z1231 Encounter for screening mammogram for malignant neoplasm of breast: Secondary | ICD-10-CM

## 2020-02-22 ENCOUNTER — Ambulatory Visit: Payer: Medicare Other

## 2020-03-31 ENCOUNTER — Ambulatory Visit: Payer: Medicare Other

## 2020-04-19 ENCOUNTER — Other Ambulatory Visit: Payer: Self-pay

## 2020-04-19 ENCOUNTER — Ambulatory Visit
Admission: RE | Admit: 2020-04-19 | Discharge: 2020-04-19 | Disposition: A | Discharge status: Home / Self Care | Payer: Medicare Other | Source: Ambulatory Visit | Attending: Obstetrics and Gynecology | Admitting: Obstetrics and Gynecology

## 2020-04-19 DIAGNOSIS — Z1231 Encounter for screening mammogram for malignant neoplasm of breast: Secondary | ICD-10-CM

## 2020-10-04 ENCOUNTER — Other Ambulatory Visit: Payer: Self-pay

## 2020-10-04 ENCOUNTER — Emergency Department (HOSPITAL_COMMUNITY)
Admission: EM | Admit: 2020-10-04 | Discharge: 2020-10-04 | Disposition: A | Discharge status: Home / Self Care | Payer: Medicare Other | Attending: Emergency Medicine | Admitting: Emergency Medicine

## 2020-10-04 DIAGNOSIS — Z760 Encounter for issue of repeat prescription: Secondary | ICD-10-CM | POA: Diagnosis not present

## 2020-10-04 DIAGNOSIS — Z5321 Procedure and treatment not carried out due to patient leaving prior to being seen by health care provider: Secondary | ICD-10-CM | POA: Diagnosis not present

## 2020-10-04 NOTE — ED Notes (Signed)
Patient told front desk that she was going to leave, patient was encouraged to stay to wait to see a doctor, patient refused and left.

## 2020-10-04 NOTE — ED Notes (Signed)
Patient step outside for 10 mins

## 2020-10-04 NOTE — ED Triage Notes (Signed)
Patient stated she needs to talk to a doctor to get refill on her chronic medications because she is unable to see her new primary care.

## 2020-10-05 DIAGNOSIS — M797 Fibromyalgia: Secondary | ICD-10-CM | POA: Diagnosis not present

## 2020-10-05 DIAGNOSIS — F17210 Nicotine dependence, cigarettes, uncomplicated: Secondary | ICD-10-CM | POA: Diagnosis not present

## 2020-10-05 DIAGNOSIS — G894 Chronic pain syndrome: Secondary | ICD-10-CM | POA: Diagnosis not present

## 2020-10-10 DIAGNOSIS — M797 Fibromyalgia: Secondary | ICD-10-CM | POA: Diagnosis not present

## 2020-10-10 DIAGNOSIS — E882 Lipomatosis, not elsewhere classified: Secondary | ICD-10-CM | POA: Diagnosis not present

## 2020-11-02 ENCOUNTER — Other Ambulatory Visit: Payer: Self-pay

## 2020-11-03 ENCOUNTER — Encounter: Payer: Self-pay | Admitting: Family Medicine

## 2020-11-03 ENCOUNTER — Ambulatory Visit (INDEPENDENT_AMBULATORY_CARE_PROVIDER_SITE_OTHER): Payer: Medicare Other | Admitting: Family Medicine

## 2020-11-03 VITALS — BP 134/76 | HR 84 | Temp 97.5°F | Ht 64.0 in | Wt 238.4 lb

## 2020-11-03 DIAGNOSIS — E7800 Pure hypercholesterolemia, unspecified: Secondary | ICD-10-CM | POA: Diagnosis not present

## 2020-11-03 DIAGNOSIS — Z1321 Encounter for screening for nutritional disorder: Secondary | ICD-10-CM

## 2020-11-03 DIAGNOSIS — R311 Benign essential microscopic hematuria: Secondary | ICD-10-CM | POA: Diagnosis not present

## 2020-11-03 DIAGNOSIS — Z7689 Persons encountering health services in other specified circumstances: Secondary | ICD-10-CM

## 2020-11-03 DIAGNOSIS — Z0000 Encounter for general adult medical examination without abnormal findings: Secondary | ICD-10-CM | POA: Diagnosis not present

## 2020-11-03 NOTE — Progress Notes (Signed)
[name] is a 55 y.o. female  Chief Complaint  Patient presents with  . Establish Care    NP- CPE/labs.  No concerns.      HPI: [name] is a 55 y.o. female seen today as a new patient to establish care with our office and for annual CPE, labs.   Last PAP: 2020-2021 - follows with GYN Dr. Christophe Louis  Last mammo: 12/2019 Last colonoscopy:  Less than 10 year ago, maybe 8-9 years - Dr. Cristina Gong w/ GI   Dental: UTD Vision: UTD follows with Dr. Gershon Crane  Pain management - Dr. Burton Apley Cardio - Dr. Delanna Ahmadi  Past Medical History:  Diagnosis Date  . Arthritis   . Chronic back pain   . Degenerative spinal arthritis   . Dercum's disease   . Fibromyalgia   . Migraines     Past Surgical History:  Procedure Laterality Date  . APPENDECTOMY    . BACK SURGERY    . CHOLECYSTECTOMY    . SPINAL FUSION    . TONSILLECTOMY      Social History   Socioeconomic History  . Marital status: Divorced    Spouse name: Not on file  . Number of children: 3  . Years of education: Not on file  . Highest education level: Not on file  Occupational History  . Not on file  Tobacco Use  . Smoking status: Current Some Day Smoker    Packs/day: 0.50    Years: 23.00    Pack years: 11.50    Types: Cigarettes    Start date: 19  . Smokeless tobacco: Never Used  Vaping Use  . Vaping Use: Never used  Substance and Sexual Activity  . Alcohol use: No  . Drug use: No  . Sexual activity: Yes  Other Topics Concern  . Not on file  Social History Narrative  . Not on file   Social Determinants of Health   Financial Resource Strain: Not on file  Food Insecurity: Not on file  Transportation Needs: Not on file  Physical Activity: Not on file  Stress: Not on file  Social Connections: Not on file  Intimate Partner Violence: Not on file    Family History  Problem Relation Age of Onset  . Aneurysm Mother   . Lung cancer Father   . Heart disease Brother        CABG  at 32, fatal MI at 8  . Heart disease Brother      Immunization History  Administered Date(s) Administered  . Influenza-Unspecified 07/12/2020  . PFIZER(Purple Top)SARS-COV-2 Vaccination 12/17/2019, 01/11/2020, 07/12/2020    Outpatient Encounter Medications as of 11/03/2020  Medication Sig  . amphetamine-dextroamphetamine (ADDERALL) 20 MG tablet   . Artificial Saliva (BIOTENE DRY MOUTH) GUM Use as directed in the mouth or throat.  . buprenorphine (BUTRANS) 10 MCG/HR PTWK Place onto the skin.  . cyclobenzaprine (FLEXERIL) 10 MG tablet Take by mouth.  Loralyn [place] OIL EX Apply topically.  . fluticasone (FLONASE) 50 MCG/ACT nasal spray Place into both nostrils daily.  Marland Kitchen gabapentin (NEURONTIN) 600 MG tablet TAKE TWO TABLETS BY MOUTH 3 TIMES A DAY  . ibuprofen (ADVIL) 600 MG tablet Take 600 mg by mouth 3 (three) times daily.  . Melatonin 3 MG TABS Take by mouth.  Marland Kitchen HYDROcodone-acetaminophen (NORCO/VICODIN) 5-325 MG per tablet Take 1-2 tablets by mouth 3 (three) times daily as needed for moderate pain.  (Patient not taking: Reported on 11/03/2020)   No facility-administered encounter medications  on file as of 11/03/2020.     ROS: Pertinent positives and negatives noted in HPI. Remainder of ROS non-contributory    Allergies  Allergen Reactions  . Quetiapine Fumarate Nausea Only    Nausea, headache and lethargy  . Seroquel [Quetiapine Fumarate] Nausea Only    Nausea, headache and lethargy   Wt Readings from Last 3 Encounters:  11/03/20 238 lb 6.4 oz (108.1 kg)  07/29/19 232 lb (105.2 kg)  06/11/19 232 lb (105.2 kg)   Temp Readings from Last 3 Encounters:  11/03/20 (!) 97.5 F (36.4 C) (Temporal)  10/04/20 98.3 F (36.8 C) (Oral)  08/19/14 97.7 F (36.5 C)   BP Readings from Last 3 Encounters:  11/03/20 134/76  10/04/20 (!) 152/109  06/11/19 134/80   Pulse Readings from Last 3 Encounters:  11/03/20 84  10/04/20 99  06/11/19 (!) 103    BP 134/76   Pulse 84   Temp (!)  97.5 F (36.4 C) (Temporal)   Ht 5\' 4"  (1.626 m)   Wt 238 lb 6.4 oz (108.1 kg)   LMP 07/17/2014 Comment: Pt stated that she is going through menopause and that she will go for 6-8 months without a period and then have it twice a month.   SpO2 96%   BMI 40.92 kg/m   Physical Exam Constitutional:      General: She is not in acute distress.    Appearance: Normal appearance. She is well-developed and well-nourished. She is obese.  HENT:     Head: Normocephalic and atraumatic.     Right Ear: Tympanic membrane and ear canal normal.     Left Ear: Tympanic membrane and ear canal normal.     Nose: Nose normal.     Mouth/Throat:     Mouth: Oropharynx is clear and moist and mucous membranes are normal.  Eyes:     Conjunctiva/sclera: Conjunctivae normal.     Pupils: Pupils are equal, round, and reactive to light.  Neck:     Thyroid: No thyromegaly.  Cardiovascular:     Rate and Rhythm: Normal rate and regular rhythm.     Pulses: Intact distal pulses.     Heart sounds: Normal heart sounds. No murmur heard.   Pulmonary:     Effort: Pulmonary effort is normal. No respiratory distress.     Breath sounds: Normal breath sounds. No wheezing or rhonchi.  Abdominal:     General: Bowel sounds are normal. There is no distension.     Palpations: Abdomen is soft. There is no mass.     Tenderness: There is no abdominal tenderness.  Musculoskeletal:        General: No edema.     Cervical back: Neck supple.     Right lower leg: No edema.     Left lower leg: No edema.  Lymphadenopathy:     Cervical: No cervical adenopathy.  Skin:    General: Skin is warm and dry.  Neurological:     Mental Status: She is alert and oriented to person, place, and time.     Motor: No abnormal muscle tone.     Coordination: Coordination normal.  Psychiatric:        Mood and Affect: Mood and affect and mood normal.        Behavior: Behavior normal.     A/P: 1. Encounter to establish care with new doctor  2.  Annual physical exam - discussed importance of regular CV exercise, healthy diet, adequate sleep - mammo, PAP, colonoscopy  UTD - UTD on dental and vision - Basic metabolic panel; Future - CBC; Future - ALT; Future - AST; Future - Lipid panel; Future  3. Hypercholesterolemia - ALT; Future - AST; Future - Lipid panel; Future  4. Encounter for vitamin deficiency screening - VITAMIN D 25 Hydroxy (Vit-D Deficiency, Fractures); Future  5. Benign microscopic hematuria - full eval by urology in the past     This visit occurred during the SARS-CoV-2 public health emergency.  Safety protocols were in place, including screening questions prior to the visit, additional usage of staff PPE, and extensive cleaning of exam room while observing appropriate contact time as indicated for disinfecting solutions.

## 2020-11-18 DIAGNOSIS — Z5181 Encounter for therapeutic drug level monitoring: Secondary | ICD-10-CM | POA: Diagnosis not present

## 2020-11-18 DIAGNOSIS — E882 Lipomatosis, not elsewhere classified: Secondary | ICD-10-CM | POA: Diagnosis not present

## 2020-11-18 DIAGNOSIS — Z79899 Other long term (current) drug therapy: Secondary | ICD-10-CM | POA: Diagnosis not present

## 2020-11-18 DIAGNOSIS — M797 Fibromyalgia: Secondary | ICD-10-CM | POA: Diagnosis not present

## 2020-11-29 ENCOUNTER — Other Ambulatory Visit: Payer: Medicare Other

## 2020-12-05 ENCOUNTER — Other Ambulatory Visit: Payer: Self-pay

## 2020-12-06 ENCOUNTER — Other Ambulatory Visit (INDEPENDENT_AMBULATORY_CARE_PROVIDER_SITE_OTHER): Payer: Medicare Other

## 2020-12-06 DIAGNOSIS — Z0000 Encounter for general adult medical examination without abnormal findings: Secondary | ICD-10-CM

## 2020-12-06 DIAGNOSIS — E7800 Pure hypercholesterolemia, unspecified: Secondary | ICD-10-CM | POA: Diagnosis not present

## 2020-12-06 DIAGNOSIS — Z1321 Encounter for screening for nutritional disorder: Secondary | ICD-10-CM

## 2020-12-06 LAB — BASIC METABOLIC PANEL
BUN: 16 mg/dL (ref 6–23)
CO2: 28 mEq/L (ref 19–32)
Calcium: 9.7 mg/dL (ref 8.4–10.5)
Chloride: 102 mEq/L (ref 96–112)
Creatinine, Ser: 0.52 mg/dL (ref 0.40–1.20)
GFR: 103.34 mL/min (ref >60.00)
Glucose, Bld: 116 mg/dL — ABNORMAL HIGH (ref 70–99)
Potassium: 4.1 mEq/L (ref 3.5–5.1)
Sodium: 138 mEq/L (ref 135–145)

## 2020-12-06 LAB — CBC
HCT: 42 % (ref 36.0–46.0)
Hemoglobin: 14.8 g/dL (ref 12.0–15.0)
MCHC: 35.2 g/dL (ref 30.0–36.0)
MCV: 92.9 fl (ref 78.0–100.0)
Platelets: 391 10*3/uL (ref 150.0–400.0)
RBC: 4.52 Mil/uL (ref 3.87–5.11)
RDW: 12.7 % (ref 11.5–15.5)
WBC: 10.2 10*3/uL (ref 4.0–10.5)

## 2020-12-06 LAB — LIPID PANEL
Cholesterol: 181 mg/dL (ref 0–200)
HDL: 42.2 mg/dL (ref >39.00)
LDL Cholesterol: 103 mg/dL — ABNORMAL HIGH (ref 0–99)
NonHDL: 139.17
Total CHOL/HDL Ratio: 4
Triglycerides: 183 mg/dL — ABNORMAL HIGH (ref 0.0–149.0)
VLDL: 36.6 mg/dL (ref 0.0–40.0)

## 2020-12-06 LAB — ALT: ALT: 36 U/L — ABNORMAL HIGH (ref 0–35)

## 2020-12-06 LAB — VITAMIN D 25 HYDROXY (VIT D DEFICIENCY, FRACTURES): VITD: 13.38 ng/mL — ABNORMAL LOW (ref 30.00–100.00)

## 2020-12-06 LAB — AST: AST: 36 U/L (ref 0–37)

## 2020-12-08 ENCOUNTER — Other Ambulatory Visit: Payer: Self-pay | Admitting: Family Medicine

## 2020-12-08 DIAGNOSIS — E559 Vitamin D deficiency, unspecified: Secondary | ICD-10-CM | POA: Insufficient documentation

## 2020-12-08 DIAGNOSIS — Z5181 Encounter for therapeutic drug level monitoring: Secondary | ICD-10-CM | POA: Diagnosis not present

## 2020-12-08 DIAGNOSIS — M797 Fibromyalgia: Secondary | ICD-10-CM | POA: Diagnosis not present

## 2020-12-08 DIAGNOSIS — R7301 Impaired fasting glucose: Secondary | ICD-10-CM

## 2020-12-08 DIAGNOSIS — E882 Lipomatosis, not elsewhere classified: Secondary | ICD-10-CM | POA: Diagnosis not present

## 2020-12-08 DIAGNOSIS — Z79891 Long term (current) use of opiate analgesic: Secondary | ICD-10-CM | POA: Diagnosis not present

## 2020-12-08 MED ORDER — VITAMIN D (ERGOCALCIFEROL) 1.25 MG (50000 UNIT) PO CAPS: 50000.0000 [IU] | ORAL_CAPSULE | ORAL | 3 refills | Status: DC

## 2020-12-20 ENCOUNTER — Telehealth: Payer: Self-pay

## 2020-12-20 NOTE — Telephone Encounter (Signed)
I gave pt the names of the medicaids we do take. She said she has [place] but still wants to speak with you.  Thanks

## 2020-12-22 NOTE — Telephone Encounter (Signed)
Spoke with pt and advised that we take [facility] MCD, [facility] [place], and [place] [facility]. Pt was unaware that she had [place] stating that Medicaid was approved at the same time as medicare disability.

## 2021-01-24 NOTE — Patient Instructions (Incomplete)
Health Maintenance Due  Topic Date Due  . Hepatitis C Screening  Never done  . HIV Screening  Never done  . TETANUS/TDAP  Never done  . COLONOSCOPY (Pts 45-11yrs Insurance coverage will need to be confirmed)  Never done    Depression screen [facility] 2/9 11/03/2020  Decreased Interest 0  Down, Depressed, Hopeless 0  PHQ - 2 Score 0

## 2021-01-25 ENCOUNTER — Telehealth: Payer: Self-pay | Admitting: Family Medicine

## 2021-01-25 DIAGNOSIS — R311 Benign essential microscopic hematuria: Secondary | ICD-10-CM

## 2021-01-25 NOTE — Telephone Encounter (Signed)
I spoke with pt and she informed me that she just wants to know her blood count level in her urine.  Pt will stop by lab for sample Friday after 1pm

## 2021-01-25 NOTE — Telephone Encounter (Signed)
UA order placed. Pt has a h/o benign microscopic hematuria so blood in urine would not be unexpected

## 2021-01-25 NOTE — Telephone Encounter (Signed)
Ok for pt to come give urine sample. Please find out if pt is having any symptoms

## 2021-01-25 NOTE — Telephone Encounter (Signed)
Pt has an upcoming appointment with Dr. Loletha Grayer on 01/26/21 for a CPE. I see she had a CPE on her NP appointment on 11/03/20. I am going to cancel her CPE for 01/26/21. She is wanting a urinalysis will she need an OV for this?

## 2021-01-26 ENCOUNTER — Encounter: Payer: Medicare Other | Admitting: Family Medicine

## 2021-01-27 ENCOUNTER — Other Ambulatory Visit (INDEPENDENT_AMBULATORY_CARE_PROVIDER_SITE_OTHER): Payer: Medicare (Managed Care)

## 2021-01-27 ENCOUNTER — Other Ambulatory Visit: Payer: Self-pay

## 2021-01-27 DIAGNOSIS — R7301 Impaired fasting glucose: Secondary | ICD-10-CM | POA: Diagnosis not present

## 2021-01-27 DIAGNOSIS — R311 Benign essential microscopic hematuria: Secondary | ICD-10-CM

## 2021-01-27 LAB — URINALYSIS, ROUTINE W REFLEX MICROSCOPIC
Ketones, ur: NEGATIVE
Leukocytes,Ua: NEGATIVE
Nitrite: NEGATIVE
Specific Gravity, Urine: >=1.03 — AB (ref 1.000–1.030)
Urine Glucose: NEGATIVE
Urobilinogen, UA: 0.2 (ref 0.0–1.0)
pH: 6 (ref 5.0–8.0)

## 2021-01-27 LAB — HEMOGLOBIN A1C: Hgb A1c MFr Bld: 6.4 % (ref 4.6–6.5)

## 2021-01-27 NOTE — Progress Notes (Signed)
Per orders of Dr.Cirigliano pt is here for labs tolerated draw well.

## 2021-01-30 ENCOUNTER — Other Ambulatory Visit: Payer: Self-pay | Admitting: Family Medicine

## 2021-01-30 NOTE — Telephone Encounter (Signed)
Refill request for: Cyclobenzaprine. LR  ?    (hx provider)  LOV 11/03/20 FOV none scheduled.    Patient aware provider out of office and can wait on response.    Please review and advise.  Thanks. Dm/cma

## 2021-02-06 ENCOUNTER — Emergency Department (HOSPITAL_COMMUNITY)
Admission: EM | Admit: 2021-02-06 | Discharge: 2021-02-07 | Disposition: A | Discharge status: Home / Self Care | Payer: Medicare Other | Attending: Emergency Medicine | Admitting: Emergency Medicine

## 2021-02-06 ENCOUNTER — Other Ambulatory Visit: Payer: Self-pay

## 2021-02-06 ENCOUNTER — Emergency Department (HOSPITAL_COMMUNITY): Payer: Medicare Other

## 2021-02-06 DIAGNOSIS — R079 Chest pain, unspecified: Secondary | ICD-10-CM | POA: Diagnosis not present

## 2021-02-06 DIAGNOSIS — R0602 Shortness of breath: Secondary | ICD-10-CM | POA: Insufficient documentation

## 2021-02-06 DIAGNOSIS — J984 Other disorders of lung: Secondary | ICD-10-CM | POA: Diagnosis not present

## 2021-02-06 DIAGNOSIS — R0789 Other chest pain: Secondary | ICD-10-CM | POA: Diagnosis not present

## 2021-02-06 DIAGNOSIS — F17210 Nicotine dependence, cigarettes, uncomplicated: Secondary | ICD-10-CM | POA: Diagnosis not present

## 2021-02-06 DIAGNOSIS — Z0389 Encounter for observation for other suspected diseases and conditions ruled out: Secondary | ICD-10-CM | POA: Diagnosis not present

## 2021-02-06 LAB — BASIC METABOLIC PANEL
Anion gap: 8 (ref 5–15)
BUN: 23 mg/dL — ABNORMAL HIGH (ref 6–20)
CO2: 23 mmol/L (ref 22–32)
Calcium: 9.3 mg/dL (ref 8.9–10.3)
Chloride: 107 mmol/L (ref 98–111)
Creatinine, Ser: 0.78 mg/dL (ref 0.44–1.00)
GFR, Estimated: >60 mL/min (ref >60)
Glucose, Bld: 114 mg/dL — ABNORMAL HIGH (ref 70–99)
Potassium: 4 mmol/L (ref 3.5–5.1)
Sodium: 138 mmol/L (ref 135–145)

## 2021-02-06 LAB — CBC
HCT: 46.3 % — ABNORMAL HIGH (ref 36.0–46.0)
Hemoglobin: 15.1 g/dL — ABNORMAL HIGH (ref 12.0–15.0)
MCH: 31.1 pg (ref 26.0–34.0)
MCHC: 32.6 g/dL (ref 30.0–36.0)
MCV: 95.3 fL (ref 80.0–100.0)
Platelets: 356 10*3/uL (ref 150–400)
RBC: 4.86 MIL/uL (ref 3.87–5.11)
RDW: 12.5 % (ref 11.5–15.5)
WBC: 9.5 10*3/uL (ref 4.0–10.5)
nRBC: 0 % (ref 0.0–0.2)

## 2021-02-06 LAB — TROPONIN I (HIGH SENSITIVITY): Troponin I (High Sensitivity): 3 ng/L (ref <18)

## 2021-02-06 LAB — D-DIMER, QUANTITATIVE: D-Dimer, Quant: 0.6 ug/mL-FEU — ABNORMAL HIGH (ref 0.00–0.50)

## 2021-02-06 MED ORDER — IOHEXOL 350 MG/ML SOLN
100.0000 mL | Freq: Once | INTRAVENOUS | Status: AC | PRN
  Administered 2021-02-06: 100 mL via INTRAVENOUS

## 2021-02-06 NOTE — ED Notes (Signed)
No answer x3

## 2021-02-06 NOTE — Discharge Instructions (Addendum)
Follow-up with your primary care doctor.  Return to ER if you develop difficulty in breathing, chest pain or other new concerning symptom.

## 2021-02-06 NOTE — ED Notes (Signed)
Pt. Had gone to her car

## 2021-02-06 NOTE — ED Provider Notes (Signed)
[facility] EMERGENCY DEPARTMENT Provider Note   CSN: [id number] Arrival date & time: 02/06/21  1433     History Chief Complaint  Patient presents with  . Chest Pain    [name] is a 55 y.o. female.  Presented to ER with concern for chest pain.  Patient reports that she has been having intermittent episodes since Thursday.  Pain at described as pressure with some associated shortness of breath.  No alleviating or aggravating factors.  Unable to identify particular triggers.  No cough, no fever, no back pain.  Currently has no pain.  Denies history of diabetes, hypertension, hyperlipidemia, smoking.  HPI     Past Medical History:  Diagnosis Date  . Arthritis   . Chronic back pain   . Degenerative spinal arthritis   . Dercum's disease   . Fibromyalgia   . Migraines     Patient Active Problem List   Diagnosis Date Noted  . Vitamin D deficiency 12/08/2020  . Elevated fasting glucose 12/08/2020  . Benign microscopic hematuria 11/03/2020  . Exertional dyspnea 06/12/2019  . Family history of early CAD 06/12/2019  . Anxiety and depression 02/13/2019  . History of migraine 02/13/2019  . Hypercholesterolemia 02/13/2019  . Diverticulosis of colon 02/13/2019  . 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 Chronic pain syndrome 02/13/2019  . Fibromyalgia 02/13/2019    Past Surgical History:  Procedure Laterality Date  . APPENDECTOMY    . BACK SURGERY    . CHOLECYSTECTOMY    . SPINAL FUSION    . TONSILLECTOMY       OB History   No obstetric history on file.     Family History  Problem Relation Age of Onset  . Aneurysm Mother   . Lung cancer Father   . Heart disease Brother        CABG at 45, fatal MI at 73  . Heart disease Brother     Social History   Tobacco Use  . Smoking status: Current Some Day Smoker    Packs/day: 0.50    Years: 23.00    Pack years: 11.50    Types: Cigarettes    Start date: 41  . Smokeless tobacco: Never Used   Vaping Use  . Vaping Use: Never used  Substance Use Topics  . Alcohol use: No  . Drug use: No    Home Medications Prior to Admission medications   Medication Sig Start Date End Date Taking? Authorizing Provider  amphetamine-dextroamphetamine (ADDERALL) 20 MG tablet  10/28/18   [provider]  Artificial Saliva (BIOTENE DRY MOUTH) GUM Use as directed in the mouth or throat.    [provider]  cyclobenzaprine (FLEXERIL) 10 MG tablet TAKE 1 TABLET BY MOUTH EVERYDAY AT BEDTIME 02/01/21   Cirigliano, Mary K, DO  EUCALYPTUS OIL EX Apply topically.    [provider]  fluticasone (FLONASE) 50 MCG/ACT nasal spray Place into both nostrils daily.    [provider]  gabapentin (NEURONTIN) 600 MG tablet TAKE TWO TABLETS BY MOUTH 3 TIMES A DAY 01/23/19   [provider]  ibuprofen (ADVIL) 600 MG tablet Take 600 mg by mouth 3 (three) times daily. 07/22/19   [provider]  Melatonin 3 MG TABS Take by mouth.    [provider]  Vitamin D, Ergocalciferol, (DRISDOL) 1.25 MG (50000 UNIT) CAPS capsule Take 1 capsule (50,000 Units total) by mouth every 7 (seven) days. 12/08/20   Ronnald Nian, DO    Allergies  Quetiapine fumarate and Seroquel [quetiapine fumarate]  Review of Systems   Review of Systems  Constitutional: Negative for chills and fever.  HENT: Negative for ear pain and sore throat.   Eyes: Negative for pain and visual disturbance.  Respiratory: Positive for shortness of breath. Negative for cough.   Cardiovascular: Positive for chest pain. Negative for palpitations.  Gastrointestinal: Negative for abdominal pain and vomiting.  Genitourinary: Negative for dysuria and hematuria.  Musculoskeletal: Negative for arthralgias and back pain.  Skin: Negative for color change and rash.  Neurological: Negative for seizures and syncope.  All other systems reviewed and are negative.   Physical Exam Updated Vital Signs BP (!)  152/80   Pulse 77   Temp 98 F (36.7 C) (Oral)   Resp 19   Ht 5\' 4"  (1.626 m)   Wt 108 kg   LMP 07/17/2014 Comment: Pt stated that she is going through menopause and that she will go for 6-8 months without a period and then have it twice a month.   SpO2 97%   BMI 40.87 kg/m   Physical Exam Vitals and nursing note reviewed.  Constitutional:      General: She is not in acute distress.    Appearance: She is well-developed.  HENT:     Head: Normocephalic and atraumatic.  Eyes:     Conjunctiva/sclera: Conjunctivae normal.  Cardiovascular:     Rate and Rhythm: Normal rate and regular rhythm.     Heart sounds: No murmur heard.   Pulmonary:     Effort: Pulmonary effort is normal. No respiratory distress.     Breath sounds: Normal breath sounds.  Abdominal:     Palpations: Abdomen is soft.     Tenderness: There is no abdominal tenderness.  Musculoskeletal:     Cervical back: Neck supple.     Right lower leg: No edema.     Left lower leg: No edema.  Skin:    General: Skin is warm and dry.  Neurological:     General: No focal deficit present.     Mental Status: She is alert.  Psychiatric:        Mood and Affect: Mood normal.     ED Results / Procedures / Treatments   Labs (all labs ordered are listed, but only abnormal results are displayed) Labs Reviewed  BASIC METABOLIC PANEL - Abnormal; Notable for the following components:      Result Value   Glucose, Bld 114 (*)    BUN 23 (*)    All other components within normal limits  CBC - Abnormal; Notable for the following components:   Hemoglobin 15.1 (*)    HCT 46.3 (*)    All other components within normal limits  D-DIMER, QUANTITATIVE - Abnormal; Notable for the following components:   D-Dimer, Quant 0.60 (*)    All other components within normal limits  TROPONIN I (HIGH SENSITIVITY)  TROPONIN I (HIGH SENSITIVITY)    EKG EKG Interpretation  Date/Time:  Monday Feb 06 2021 14:41:14 EDT Ventricular Rate:  104 PR  Interval:  172 QRS Duration: 76 QT Interval:  334 QTC Calculation: 439 R Axis:   -3 Text Interpretation: Sinus tachycardia Septal infarct , age undetermined Abnormal ECG Confirmed by Madalyn Rob [phone]) on 02/06/2021 7:30:15 PM   Radiology DG Chest 2 View  Result Date: 02/06/2021 CLINICAL DATA:  Chest pain. EXAM: CHEST - 2 VIEW COMPARISON:  August 11, 2019 FINDINGS: The heart size and mediastinal contours are within normal limits. Both  lungs are clear. Radiopaque surgical clips are seen within the right upper quadrant. The visualized skeletal structures are unremarkable. IMPRESSION: No active cardiopulmonary disease. Electronically Signed   By: Virgina Norfolk M.D.   On: 02/06/2021 15:29   CT Angio Chest PE W and/or Wo Contrast  Result Date: 02/06/2021 CLINICAL DATA:  55 year old female with concern for pulmonary embolism. EXAM: CT ANGIOGRAPHY CHEST WITH CONTRAST TECHNIQUE: Multidetector CT imaging of the chest was performed using the standard protocol during bolus administration of intravenous contrast. Multiplanar CT image reconstructions and MIPs were obtained to evaluate the vascular anatomy. CONTRAST:  1106mL OMNIPAQUE IOHEXOL 350 MG/ML SOLN COMPARISON:  Chest CT dated 08/19/2014. FINDINGS: Cardiovascular: There is no cardiomegaly or pericardial effusion. The thoracic aorta is unremarkable. The origins of the great vessels of the aortic arch appear patent. No pulmonary artery embolus identified. Mediastinum/Nodes: No hilar or mediastinal adenopathy. The esophagus and the thyroid gland are grossly unremarkable. No mediastinal fluid collection. Lungs/Pleura: No focal consolidation, pleural effusion, or pneumothorax. Faint bilateral diffuse ground-glass densities, likely atelectasis. The central airways are patent. Upper Abdomen: No acute abnormality. Musculoskeletal: No chest wall abnormality. No acute or significant osseous findings. Review of the MIP images confirms the above findings.  IMPRESSION: No acute intrathoracic pathology. No CT evidence of pulmonary embolism. Electronically Signed   By: Anner Crete M.D.   On: 02/06/2021 22:47    Procedures Procedures   Medications Ordered in ED Medications  iohexol (OMNIPAQUE) 350 MG/ML injection 100 mL (100 mLs Intravenous Contrast Given 02/06/21 2235)    ED Course  I have reviewed the triage vital signs and the nursing notes.  Pertinent labs & imaging results that were available during my care of the patient were reviewed by me and considered in my medical decision making (see chart for details).    MDM Rules/Calculators/A&P                         55 year old lady presenting to ER with concern for episode of chest pain/shortness of breath.  Episodes occurring at rest, not associated with exertion.  Currently pain-free.  EKG without obvious ischemic change.  Troponin within normal limits, based on history and these findings, doubt ACS.  Dimer was mildly elevated.  CT scan negative for pulmonary embolism or other acute pathology.  On reassessment she remained symptom-free.  Will discharge home.   After the discussed management above, the patient was determined to be safe for discharge.  The patient was in agreement with this plan and all questions regarding their care were answered.  ED return precautions were discussed and the patient will return to the ED with any significant worsening of condition.   Final Clinical Impression(s) / ED Diagnoses Final diagnoses:  Chest pain, unspecified type    Rx / DC Orders ED Discharge Orders    None       Lucrezia Starch, MD 02/07/21 1558

## 2021-02-06 NOTE — ED Notes (Signed)
Patient transported to X-ray 

## 2021-02-06 NOTE — ED Provider Notes (Signed)
Emergency Medicine Provider Triage Evaluation Note  [name] , a 55 y.o. female  was evaluated in triage.  Pt complains of CP onset Thursday, sweating while making a sandwich with dizziness, felt nervous. Feels like a heavy pressure on the chest, SHOB. Located anterior chest, radiates to shoulder/neck.  Has pain 24/7 from lipomas and fibromyalgia so wasn't sure what was causing her pain.  Family history of brother with MI before 85  Review of Systems  Positive: CP, SHOB, indigestion Negative: nausea  Physical Exam  BP (!) 139/108 (BP Location: Right Wrist)   Pulse (!) 104   Temp 98 F (36.7 C) (Oral)   Resp 20   LMP 07/17/2014 Comment: Pt stated that she is going through menopause and that she will go for 6-8 months without a period and then have it twice a month.   SpO2 100%  Gen:   Awake, no distress   Resp:  Normal effort  MSK:   Moves extremities without difficulty  Other:    Medical Decision Making  Medically screening exam initiated at 2:41 PM.  Appropriate orders placed.  Othella Ragene Strohecker was informed that the remainder of the evaluation will be completed by another provider, this initial triage assessment does not replace that evaluation, and the importance of remaining in the ED until their evaluation is complete.     Tacy Learn, PA-C 02/06/21 1443    Milton Ferguson, MD 02/06/21 [phone]

## 2021-02-06 NOTE — ED Triage Notes (Signed)
Pt presents to the ED with central chest pain that feels heavy with SOB on exertion. Denies nausea.

## 2021-02-06 NOTE — ED Notes (Signed)
Pt transported to CT ?

## 2021-02-07 ENCOUNTER — Ambulatory Visit (INDEPENDENT_AMBULATORY_CARE_PROVIDER_SITE_OTHER): Payer: Medicare Other

## 2021-02-07 VITALS — Ht 64.0 in | Wt 233.5 lb

## 2021-02-07 DIAGNOSIS — Z0000 Encounter for general adult medical examination without abnormal findings: Secondary | ICD-10-CM | POA: Diagnosis not present

## 2021-02-07 NOTE — Patient Instructions (Signed)
[name] , Thank you for taking time to complete your Medicare Wellness Visit. I appreciate your ongoing commitment to your health goals. Please review the following plan we discussed and let me know if I can assist you in the future.   Screening recommendations/referrals: Colonoscopy: Unsure of last date. Per our conversation, you will call & get information regarding last colonoscopy. Mammogram: Completed 04/19/2020-Due 04/19/2021 Bone Density: Not yet indicated. Due at age 54. Recommended yearly ophthalmology/optometry visit for glaucoma screening and checkup Recommended yearly dental visit for hygiene and checkup  Vaccinations: Influenza vaccine: Up to date Pneumococcal vaccine: Not yet indicated. Due at age 14. Tdap vaccine: Discuss with pharmacy Shingles vaccine: Discuss with pharmacy Covid-19: Booster due-Discuss with pharmacy  Advanced directives: Information left at the front desk to pick up at next visit.  Conditions/risks identified: See problem list  Next appointment: Follow up in one year for your annual wellness visit.   Preventive Care 40-64 Years, Female Preventive care refers to lifestyle choices and visits with your health care provider that can promote health and wellness. What does preventive care include?  A yearly physical exam. This is also called an annual well check.  Dental exams once or twice a year.  Routine eye exams. Ask your health care provider how often you should have your eyes checked.  Personal lifestyle choices, including:  Daily care of your teeth and gums.  Regular physical activity.  Eating a healthy diet.  Avoiding tobacco and drug use.  Limiting alcohol use.  Practicing safe sex.  Taking low-dose aspirin daily starting at age 47.  Taking vitamin and mineral supplements as recommended by your health care provider. What happens during an annual well check? The services and screenings done by your health care provider during  your annual well check will depend on your age, overall health, lifestyle risk factors, and family history of disease. Counseling  Your health care provider may ask you questions about your:  Alcohol use.  Tobacco use.  Drug use.  Emotional well-being.  Home and relationship well-being.  Sexual activity.  Eating habits.  Work and work Statistician.  Method of birth control.  Menstrual cycle.  Pregnancy history. Screening  You may have the following tests or measurements:  Height, weight, and BMI.  Blood pressure.  Lipid and cholesterol levels. These may be checked every 5 years, or more frequently if you are over 74 years old.  Skin check.  Lung cancer screening. You may have this screening every year starting at age 58 if you have a 30-pack-year history of smoking and currently smoke or have quit within the past 15 years.  Fecal occult blood test (FOBT) of the stool. You may have this test every year starting at age 17.  Flexible sigmoidoscopy or colonoscopy. You may have a sigmoidoscopy every 5 years or a colonoscopy every 10 years starting at age 50.  Hepatitis C blood test.  Hepatitis B blood test.  Sexually transmitted disease (STD) testing.  Diabetes screening. This is done by checking your blood sugar (glucose) after you have not eaten for a while (fasting). You may have this done every 1-3 years.  Mammogram. This may be done every 1-2 years. Talk to your health care provider about when you should start having regular mammograms. This may depend on whether you have a family history of breast cancer.  BRCA-related cancer screening. This may be done if you have a family history of breast, ovarian, tubal, or peritoneal cancers.  Pelvic exam and  Pap test. This may be done every 3 years starting at age 22. Starting at age 46, this may be done every 5 years if you have a Pap test in combination with an HPV test.  Bone density scan. This is done to screen for  osteoporosis. You may have this scan if you are at high risk for osteoporosis. Discuss your test results, treatment options, and if necessary, the need for more tests with your health care provider. Vaccines  Your health care provider may recommend certain vaccines, such as:  Influenza vaccine. This is recommended every year.  Tetanus, diphtheria, and acellular pertussis (Tdap, Td) vaccine. You may need a Td booster every 10 years.  Zoster vaccine. You may need this after age 23.  Pneumococcal 13-valent conjugate (PCV13) vaccine. You may need this if you have certain conditions and were not previously vaccinated.  Pneumococcal polysaccharide (PPSV23) vaccine. You may need one or two doses if you smoke cigarettes or if you have certain conditions. Talk to your health care provider about which screenings and vaccines you need and how often you need them. This information is not intended to replace advice given to you by your health care provider. Make sure you discuss any questions you have with your health care provider. Document Released: 10/14/2015 Document Revised: 06/06/2016 Document Reviewed: 07/19/2015 Elsevier Interactive Patient Education  2017 [place] Prevention in the Home Falls can cause injuries. They can happen to people of all ages. There are many things you can do to make your home safe and to help prevent falls. What can I do on the outside of my home?  Regularly fix the edges of walkways and driveways and fix any cracks.  Remove anything that might make you trip as you walk through a door, such as a raised step or threshold.  Trim any bushes or trees on the path to your home.  Use bright outdoor lighting.  Clear any walking paths of anything that might make someone trip, such as rocks or tools.  Regularly check to see if handrails are loose or broken. Make sure that both sides of any steps have handrails.  Any raised decks and porches should have  guardrails on the edges.  Have any leaves, snow, or ice cleared regularly.  Use sand or salt on walking paths during winter.  Clean up any spills in your garage right away. This includes oil or grease spills. What can I do in the bathroom?  Use night lights.  Install grab bars by the toilet and in the tub and shower. Do not use towel bars as grab bars.  Use non-skid mats or decals in the tub or shower.  If you need to sit down in the shower, use a plastic, non-slip stool.  Keep the floor dry. Clean up any water that spills on the floor as soon as it happens.  Remove soap buildup in the tub or shower regularly.  Attach bath mats securely with double-sided non-slip rug tape.  Do not have throw rugs and other things on the floor that can make you trip. What can I do in the bedroom?  Use night lights.  Make sure that you have a light by your bed that is easy to reach.  Do not use any sheets or blankets that are too big for your bed. They should not hang down onto the floor.  Have a firm chair that has side arms. You can use this for support while you  get dressed.  Do not have throw rugs and other things on the floor that can make you trip. What can I do in the kitchen?  Clean up any spills right away.  Avoid walking on wet floors.  Keep items that you use a lot in easy-to-reach places.  If you need to reach something above you, use a strong step stool that has a grab bar.  Keep electrical cords out of the way.  Do not use floor polish or wax that makes floors slippery. If you must use wax, use non-skid floor wax.  Do not have throw rugs and other things on the floor that can make you trip. What can I do with my stairs?  Do not leave any items on the stairs.  Make sure that there are handrails on both sides of the stairs and use them. Fix handrails that are broken or loose. Make sure that handrails are as long as the stairways.  Check any carpeting to make sure that  it is firmly attached to the stairs. Fix any carpet that is loose or worn.  Avoid having throw rugs at the top or bottom of the stairs. If you do have throw rugs, attach them to the floor with carpet tape.  Make sure that you have a light switch at the top of the stairs and the bottom of the stairs. If you do not have them, ask someone to add them for you. What else can I do to help prevent falls?  Wear shoes that:  Do not have high heels.  Have rubber bottoms.  Are comfortable and fit you well.  Are closed at the toe. Do not wear sandals.  If you use a stepladder:  Make sure that it is fully opened. Do not climb a closed stepladder.  Make sure that both sides of the stepladder are locked into place.  Ask someone to hold it for you, if possible.  Clearly mark and make sure that you can see:  Any grab bars or handrails.  First and last steps.  Where the edge of each step is.  Use tools that help you move around (mobility aids) if they are needed. These include:  Canes.  Walkers.  Scooters.  Crutches.  Turn on the lights when you go into a dark area. Replace any light bulbs as soon as they burn out.  Set up your furniture so you have a clear path. Avoid moving your furniture around.  If any of your floors are uneven, fix them.  If there are any pets around you, be aware of where they are.  Review your medicines with your doctor. Some medicines can make you feel dizzy. This can increase your chance of falling. Ask your doctor what other things that you can do to help prevent falls. This information is not intended to replace advice given to you by your health care provider. Make sure you discuss any questions you have with your health care provider. Document Released: 07/14/2009 Document Revised: 02/23/2016 Document Reviewed: 10/22/2014 Elsevier Interactive Patient Education  2017 [employer].

## 2021-02-07 NOTE — Progress Notes (Signed)
Subjective:   [name] is a 55 y.o. female who presents for an Initial Medicare Annual Wellness Visit.  I connected with Monick today by telephone and verified that I am speaking with the correct person using two identifiers. Location patient: home Location provider: work Persons participating in the virtual visit: patient, Marine scientist.    I discussed the limitations, risks, security and privacy concerns of performing an evaluation and management service by telephone and the availability of in person appointments. I also discussed with the patient that there may be a patient responsible charge related to this service. The patient expressed understanding and verbally consented to this telephonic visit.    Interactive audio and video telecommunications were attempted between this provider and patient, however failed, due to patient having technical difficulties OR patient did not have access to video capability.  We continued and completed visit with audio only.  Some vital signs may be absent or patient reported.   Time Spent with patient on telephone encounter: 30 minutes   Review of Systems     Cardiac Risk Factors include: hypertension;obesity (BMI >30kg/m2);sedentary lifestyle;smoking/ tobacco exposure     Objective:    Today's Vitals   02/07/21 1416 02/07/21 1417  Weight: 233 lb 8 oz (105.9 kg)   Height: 5\' 4"  (1.626 m)   PainSc:  7    Body mass index is 40.08 kg/m.  Advanced Directives 02/07/2021 08/18/2014 07/18/2014  Does Patient Have a Medical Advance Directive? No No No  Would patient like information on creating a medical advance directive? Yes (MAU/Ambulatory/Procedural Areas - Information given) No - patient declined information No - patient declined information    Current Medications (verified) Outpatient Encounter Medications as of 02/07/2021  Medication Sig  . amphetamine-dextroamphetamine (ADDERALL) 20 MG tablet   . Artificial Saliva (BIOTENE DRY  MOUTH) GUM Use as directed in the mouth or throat.  . cyclobenzaprine (FLEXERIL) 10 MG tablet TAKE 1 TABLET BY MOUTH EVERYDAY AT BEDTIME  . EUCALYPTUS OIL EX Apply topically.  . fluticasone (FLONASE) 50 MCG/ACT nasal spray Place into both nostrils daily.  Marland Kitchen gabapentin (NEURONTIN) 600 MG tablet TAKE TWO TABLETS BY MOUTH 3 TIMES A DAY  . ibuprofen (ADVIL) 600 MG tablet Take 600 mg by mouth 3 (three) times daily.  . Melatonin 3 MG TABS Take by mouth.  . Vitamin D, Ergocalciferol, (DRISDOL) 1.25 MG (50000 UNIT) CAPS capsule Take 1 capsule (50,000 Units total) by mouth every 7 (seven) days.  . buprenorphine (BUTRANS) 10 MCG/HR PTWK 1 patch once a week.   No facility-administered encounter medications on file as of 02/07/2021.    Allergies (verified) Quetiapine fumarate and Seroquel [quetiapine fumarate]   History: Past Medical History:  Diagnosis Date  . Arthritis   . Chronic back pain   . Degenerative spinal arthritis   . Dercum's disease   . Fibromyalgia   . Migraines    Past Surgical History:  Procedure Laterality Date  . APPENDECTOMY    . BACK SURGERY    . CHOLECYSTECTOMY    . SPINAL FUSION    . TONSILLECTOMY     Family History  Problem Relation Age of Onset  . Aneurysm Mother   . Lung cancer Father   . Heart disease Brother        CABG at 58, fatal MI at 70  . Heart disease Brother    Social History   Socioeconomic History  . Marital status: Divorced    Spouse name: Not on file  .  Number of children: 3  . Years of education: Not on file  . Highest education level: Not on file  Occupational History  . Not on file  Tobacco Use  . Smoking status: Current Some Day Smoker    Packs/day: 0.50    Years: 23.00    Pack years: 11.50    Types: Cigarettes    Start date: 81  . Smokeless tobacco: Never Used  Vaping Use  . Vaping Use: Never used  Substance and Sexual Activity  . Alcohol use: No  . Drug use: No  . Sexual activity: Yes  Other Topics Concern  . Not on  file  Social History Narrative  . Not on file   Social Determinants of Health   Financial Resource Strain: Low Risk   . Difficulty of Paying Living Expenses: Not hard at all  Food Insecurity: No Food Insecurity  . Worried About Charity fundraiser in the Last Year: Never true  . Ran Out of Food in the Last Year: Never true  Transportation Needs: No Transportation Needs  . Lack of Transportation (Medical): No  . Lack of Transportation (Non-Medical): No  Physical Activity: Inactive  . Days of Exercise per Week: 0 days  . Minutes of Exercise per Session: 0 min  Stress: Stress Concern Present  . Feeling of Stress : To some extent  Social Connections: Moderately Isolated  . Frequency of Communication with Friends and Family: More than three times a week  . Frequency of Social Gatherings with Friends and Family: More than three times a week  . Attends Religious Services: More than 4 times per year  . Active Member of Clubs or Organizations: No  . Attends Archivist Meetings: Never  . Marital Status: Divorced    Tobacco Counseling Ready to quit: Not Answered Counseling given: Not Answered   Clinical Intake:  Pre-visit preparation completed: Yes  Pain : 0-10 Pain Score: 7  Pain Type: Chronic pain Pain Location: Generalized Pain Onset: More than a month ago Pain Frequency: Constant Pain Relieving Factors: flexeril, meloxicam  Pain Relieving Factors: flexeril, meloxicam  Nutritional Status: BMI > 30  Obese Nutritional Risks: None Diabetes: No  How often do you need to have someone help you when you read instructions, pamphlets, or other written materials from your doctor or pharmacy?: 1 - Never  Diabetic?No  Interpreter Needed?: No  Information entered by :: Caroleen Hamman LPN   Activities of Daily Living In your present state of health, do you have any difficulty performing the following activities: 02/07/2021  Hearing? N  Vision? N  Difficulty  concentrating or making decisions? N  Walking or climbing stairs? N  Dressing or bathing? N  Doing errands, shopping? N  Preparing Food and eating ? N  Using the Toilet? N  In the past six months, have you accidently leaked urine? N  Do you have problems with loss of bowel control? N  Managing your Medications? N  Managing your Finances? N  Housekeeping or managing your Housekeeping? N  Some recent data might be hidden    Patient Care Team: Ronnald Nian, DO as PCP - General (Family Medicine)  Indicate any recent Medical Services you may have received from other than [facility] providers in the past year (date may be approximate).     Assessment:   This is a routine wellness examination for [name].  Hearing/Vision screen  Hearing Screening   125Hz  250Hz  500Hz  1000Hz  2000Hz  3000Hz  4000Hz  6000Hz  8000Hz   Right  ear:           Left ear:           Comments: No issues  Vision Screening Comments: Wears glasses Last eye exam-01/2020-Dr. Gershon Crane  Dietary issues and exercise activities discussed: Current Exercise Habits: The patient does not participate in regular exercise at present, Exercise limited by: None identified  Goals Addressed            This Visit's Progress   . Patient Stated       Increase activity    . Quit Smoking        Depression Screen PHQ 2/9 Scores 02/07/2021 11/03/2020  PHQ - 2 Score 0 0    Fall Risk Fall Risk  02/07/2021  Falls in the past year? 0  Number falls in past yr: 0  Injury with Fall? 0  Follow up Falls prevention discussed    FALL RISK PREVENTION PERTAINING TO THE HOME:  Any stairs in or around the home? No  Home free of loose throw rugs in walkways, pet beds, electrical cords, etc? Yes  Adequate lighting in your home to reduce risk of falls? Yes   ASSISTIVE DEVICES UTILIZED TO PREVENT FALLS:  Life alert? No  Use of a cane, walker or w/c? No  Grab bars in the bathroom? No  Shower chair or bench in shower? No  Elevated toilet seat  or a handicapped toilet? No   TIMED UP AND GO:  Was the test performed? No . Phone visit   Cognitive Function:Normal cognitive status assessed by  this Nurse Health Advisor. No abnormalities found.          Immunizations Immunization History  Administered Date(s) Administered  . Influenza-Unspecified 07/12/2020  . PFIZER(Purple Top)SARS-COV-2 Vaccination 12/17/2019, 01/11/2020, 07/12/2020    TDAP status: Due, Education has been provided regarding the importance of this vaccine. Advised may receive this vaccine at local pharmacy or Health Dept. Aware to provide a copy of the vaccination record if obtained from local pharmacy or Health Dept. Verbalized acceptance and understanding.  Flu Vaccine status: Up to date  Pneumococcal vaccine status: Not yet indicated  Covid-19 vaccine status: Completed vaccines  Qualifies for Shingles Vaccine? Yes   Zostavax completed No   Shingrix Completed?: No.    Education has been provided regarding the importance of this vaccine. Patient has been advised to call insurance company to determine out of pocket expense if they have not yet received this vaccine. Advised may also receive vaccine at local pharmacy or Health Dept. Verbalized acceptance and understanding.  Screening Tests Health Maintenance  Topic Date Due  . HIV Screening  Never done  . Hepatitis C Screening  Never done  . TETANUS/TDAP  Never done  . COLONOSCOPY (Pts 45-75yrs Insurance coverage will need to be confirmed)  Never done  . INFLUENZA VACCINE  05/01/2021  . PAP SMEAR-Modifier  10/07/2021  . MAMMOGRAM  04/19/2022  . COVID-19 Vaccine  Completed  . HPV VACCINES  Aged Out    Health Maintenance  Health Maintenance Due  Topic Date Due  . HIV Screening  Never done  . Hepatitis C Screening  Never done  . TETANUS/TDAP  Never done  . COLONOSCOPY (Pts 45-37yrs Insurance coverage will need to be confirmed)  Never done    Colorectal cancer screening: Date unknown. Patient   Will obtain information regarding her last colonoscopy & bring to her next office visit  Mammogram status: Completed Bilateral 04/19/2020. Repeat every year  Bone Density status: Not  yet indicated  Lung Cancer Screening: (Low Dose CT Chest recommended if Age 26-80 years, 30 pack-year currently smoking OR have quit w/in 15years.) does not qualify.     Additional Screening:  Hepatitis C Screening: does qualify; Phone visit-Discuss with PCP  Vision Screening: Recommended annual ophthalmology exams for early detection of glaucoma and other disorders of the eye. Is the patient up to date with their annual eye exam?  Yes  Who is the provider or what is the name of the office in which the patient attends annual eye exams? Dr. Gershon Crane  Dental Screening: Recommended annual dental exams for proper oral hygiene  Community Resource Referral / Chronic Care Management: CRR required this visit?  No   CCM required this visit?  No      Plan:     I have personally reviewed and noted the following in the patient's chart:   . Medical and social history . Use of alcohol, tobacco or illicit drugs  . Current medications and supplements including opioid prescriptions. Patient is currently taking opioid prescriptions. Information provided to patient regarding non-opioid alternatives. Patient advised to discuss non-opioid treatment plan with their provider. . Functional ability and status . Nutritional status . Physical activity . Advanced directives . List of other physicians . Hospitalizations, surgeries, and ER visits in previous 12 months . Vitals . Screenings to include cognitive, depression, and falls . Referrals and appointments  In addition, I have reviewed and discussed with patient certain preventive protocols, quality metrics, and best practice recommendations. A written personalized care plan for preventive services as well as general preventive health recommendations were provided to  patient.   Due to this being a telephonic visit, the after visit summary with patients personalized plan was offered to patient via mail or my-chart. Patient preferred to pick up at office at next visit.   Marta Antu, LPN   [id number]  Nurse Health Advisor  Nurse Notes: None

## 2021-02-15 ENCOUNTER — Ambulatory Visit: Payer: 59 | Admitting: Family Medicine

## 2021-02-16 DIAGNOSIS — Z79899 Other long term (current) drug therapy: Secondary | ICD-10-CM | POA: Diagnosis not present

## 2021-02-16 DIAGNOSIS — E882 Lipomatosis, not elsewhere classified: Secondary | ICD-10-CM | POA: Diagnosis not present

## 2021-02-16 DIAGNOSIS — M797 Fibromyalgia: Secondary | ICD-10-CM | POA: Diagnosis not present

## 2021-02-22 ENCOUNTER — Other Ambulatory Visit: Payer: Self-pay

## 2021-02-23 ENCOUNTER — Ambulatory Visit (INDEPENDENT_AMBULATORY_CARE_PROVIDER_SITE_OTHER): Payer: Medicare Other | Admitting: Family Medicine

## 2021-02-23 ENCOUNTER — Encounter: Payer: Self-pay | Admitting: Family Medicine

## 2021-02-23 VITALS — HR 105 | Temp 96.8°F | Ht 64.0 in | Wt 234.2 lb

## 2021-02-23 DIAGNOSIS — R7301 Impaired fasting glucose: Secondary | ICD-10-CM

## 2021-02-23 DIAGNOSIS — B356 Tinea cruris: Secondary | ICD-10-CM | POA: Diagnosis not present

## 2021-02-23 NOTE — Progress Notes (Signed)
[name] is a 55 y.o. female  Chief Complaint  Patient presents with  . Hospitalization Follow-up    Pt here for ER f/u visit x 2weeks ago.  Pt c/o rush on panty line area, x 2 weeks ago.  Pt also has a rash on lt arm due to a pain patch.  Could not get Bp reading.    HPI: [name] is a 55 y.o. female complains of  1. rash in groin x 2 wks. She has had this in the past. Uses clotrimazole and resolved. She states rash has resolved but has some residual "discoloration/scarring". She states she was in [place] for graduation, walking a lot in heat.  2. Rash on arms where Butrans patch was. She spoke to pain physician who recommended spraying flonase over area prior to applying next patch.. 3. Seen in ER for chest pain on 02/06/21. Normal troponin. Elevated d dimer but neg CTA chest. Symptoms have resolved.  She notes culmination of multiple stressors and wonders if these caused/contributed to symptoms. 4. IFG with A1C = 6.4. she has made significant dietary changes. She is not buying donuts, cookies. She is working to cut back on carbs, ice cream. Will limit pizza to 1x/wk. She needs to work on portion size. She does not want to take medication at this time.    Past Medical History:  Diagnosis Date  . Arthritis   . Chronic back pain   . Degenerative spinal arthritis   . Dercum's disease   . Fibromyalgia   . Migraines     Past Surgical History:  Procedure Laterality Date  . APPENDECTOMY    . BACK SURGERY    . CHOLECYSTECTOMY    . SPINAL FUSION    . TONSILLECTOMY      Social History   Socioeconomic History  . Marital status: Divorced    Spouse name: Not on file  . Number of children: 3  . Years of education: Not on file  . Highest education level: Not on file  Occupational History  . Not on file  Tobacco Use  . Smoking status: Current Some Day Smoker    Packs/day: 0.50    Years: 23.00    Pack years: 11.50    Types: Cigarettes    Start date:  44  . Smokeless tobacco: Never Used  Vaping Use  . Vaping Use: Never used  Substance and Sexual Activity  . Alcohol use: No  . Drug use: No  . Sexual activity: Yes  Other Topics Concern  . Not on file  Social History Narrative  . Not on file   Social Determinants of Health   Financial Resource Strain: Low Risk   . Difficulty of Paying Living Expenses: Not hard at all  Food Insecurity: No Food Insecurity  . Worried About Charity fundraiser in the Last Year: Never true  . Ran Out of Food in the Last Year: Never true  Transportation Needs: No Transportation Needs  . Lack of Transportation (Medical): No  . Lack of Transportation (Non-Medical): No  Physical Activity: Inactive  . Days of Exercise per Week: 0 days  . Minutes of Exercise per Session: 0 min  Stress: Stress Concern Present  . Feeling of Stress : To some extent  Social Connections: Moderately Isolated  . Frequency of Communication with Friends and Family: More than three times a week  . Frequency of Social Gatherings with Friends and Family: More than three times a week  .  Attends Religious Services: More than 4 times per year  . Active Member of Clubs or Organizations: No  . Attends Archivist Meetings: Never  . Marital Status: Divorced  Human resources officer Violence: Not At Risk  . Fear of Current or Ex-Partner: No  . Emotionally Abused: No  . Physically Abused: No  . Sexually Abused: No    Family History  Problem Relation Age of Onset  . Aneurysm Mother   . Lung cancer Father   . Heart disease Brother        CABG at 64, fatal MI at 31  . Heart disease Brother      Immunization History  Administered Date(s) Administered  . Influenza-Unspecified 07/12/2020  . PFIZER(Purple Top)SARS-COV-2 Vaccination 12/17/2019, 01/11/2020, 07/12/2020    Outpatient Encounter Medications as of 02/23/2021  Medication Sig  . amphetamine-dextroamphetamine (ADDERALL) 20 MG tablet   . Artificial Saliva (BIOTENE DRY  MOUTH) GUM Use as directed in the mouth or throat.  . buprenorphine (BUTRANS) 10 MCG/HR PTWK 1 patch once a week.  . cyclobenzaprine (FLEXERIL) 10 MG tablet TAKE 1 TABLET BY MOUTH EVERYDAY AT BEDTIME  . EUCALYPTUS OIL EX Apply topically.  . fluticasone (FLONASE) 50 MCG/ACT nasal spray Place into both nostrils daily.  Marland Kitchen gabapentin (NEURONTIN) 600 MG tablet TAKE TWO TABLETS BY MOUTH 3 TIMES A DAY  . ibuprofen (ADVIL) 600 MG tablet Take 600 mg by mouth 3 (three) times daily.  . meloxicam (MOBIC) 15 MG tablet Take 1 tablet by mouth daily.  . Vitamin D, Ergocalciferol, (DRISDOL) 1.25 MG (50000 UNIT) CAPS capsule Take 1 capsule (50,000 Units total) by mouth every 7 (seven) days.  . Melatonin 3 MG TABS Take by mouth. (Patient not taking: Reported on 02/23/2021)   No facility-administered encounter medications on file as of 02/23/2021.     ROS: Pertinent positives and negatives noted in HPI. Remainder of ROS non-contributory   Allergies  Allergen Reactions  . Quetiapine Fumarate Nausea Only    Nausea, headache and lethargy  . Seroquel [Quetiapine Fumarate] Nausea Only    Nausea, headache and lethargy    Pulse (!) 105   Temp (!) 96.8 F (36 C) (Temporal)   Ht 5\' 4"  (1.626 m)   Wt 234 lb 3.2 oz (106.2 kg)   LMP 07/17/2014 Comment: Pt stated that she is going through menopause and that she will go for 6-8 months without a period and then have it twice a month.   SpO2 97%   BMI 40.20 kg/m   Physical Exam Constitutional:      General: She is not in acute distress.    Appearance: She is not ill-appearing.  Cardiovascular:     Rate and Rhythm: Normal rate and regular rhythm.  Pulmonary:     Effort: Pulmonary effort is normal. No respiratory distress.  Musculoskeletal:     Right lower leg: No edema.     Left lower leg: No edema.  Neurological:     Mental Status: She is alert.      A/P:  1. Elevated fasting glucose - A1C = 6.4 - pt is making significant dietary changes - will  be as active as possible but is limited d/t pain - f/u in 3 mo for lab appt - Hemoglobin A1c; Future  2. Tinea inguinalis - resolved with clotrimazole - some post-inflammatory hyper-pigmentation - f/u PRN

## 2021-03-21 DIAGNOSIS — E882 Lipomatosis, not elsewhere classified: Secondary | ICD-10-CM | POA: Diagnosis not present

## 2021-03-21 DIAGNOSIS — M797 Fibromyalgia: Secondary | ICD-10-CM | POA: Diagnosis not present

## 2021-03-21 DIAGNOSIS — Z79899 Other long term (current) drug therapy: Secondary | ICD-10-CM | POA: Diagnosis not present

## 2021-04-18 DIAGNOSIS — Z79899 Other long term (current) drug therapy: Secondary | ICD-10-CM | POA: Diagnosis not present

## 2021-04-18 DIAGNOSIS — E882 Lipomatosis, not elsewhere classified: Secondary | ICD-10-CM | POA: Diagnosis not present

## 2021-04-18 DIAGNOSIS — M797 Fibromyalgia: Secondary | ICD-10-CM | POA: Diagnosis not present

## 2021-04-19 ENCOUNTER — Ambulatory Visit (INDEPENDENT_AMBULATORY_CARE_PROVIDER_SITE_OTHER): Payer: Medicare Other | Admitting: Family Medicine

## 2021-04-19 ENCOUNTER — Other Ambulatory Visit: Payer: Self-pay

## 2021-04-19 ENCOUNTER — Encounter: Payer: Self-pay | Admitting: Family Medicine

## 2021-04-19 VITALS — BP 108/60 | HR 102 | Temp 98.9°F | Ht 64.0 in | Wt 227.8 lb

## 2021-04-19 DIAGNOSIS — M549 Dorsalgia, unspecified: Secondary | ICD-10-CM | POA: Diagnosis not present

## 2021-04-19 DIAGNOSIS — R0683 Snoring: Secondary | ICD-10-CM

## 2021-04-19 DIAGNOSIS — Z6839 Body mass index (BMI) 39.0-39.9, adult: Secondary | ICD-10-CM

## 2021-04-19 DIAGNOSIS — R5382 Chronic fatigue, unspecified: Secondary | ICD-10-CM

## 2021-04-19 DIAGNOSIS — E6609 Other obesity due to excess calories: Secondary | ICD-10-CM

## 2021-04-19 MED ORDER — CYCLOBENZAPRINE HCL 10 MG PO TABS: 10.0000 mg | ORAL_TABLET | Freq: Three times a day (TID) | ORAL | 0 refills | Status: DC | PRN

## 2021-04-19 NOTE — Patient Instructions (Addendum)
Heating pad 3x/day 15-83min on then off Gentle stretching exercises after heat Take flexeril 10mg  2-3x/day x 3-5 days then resume taking nightly Massage is fine Follow-up if worsening or not improve in 2 wks  Referral to sleep medicine/pulmonary

## 2021-04-19 NOTE — Progress Notes (Addendum)
[name] is a 55 y.o. female  Chief Complaint  Patient presents with   Follow-up    Pt c/o chronic fatigue, x4-6 months and pain right shoulder since yesterday, pain ranges 6-8, has taken OTC meds with no relief.    HPI: [name] is a 55 y.o. female patient who complains of Rt upper back neck pain that began at 5am today. She states she heard something "pop" when she leaned her Rt arm to get out of bed to use the bathroom. She feels the top of her Rt back is "firm and raised" and her Lt side is "normal". Rates as 6-8/10 on pain scale.  Denies numbness or tingling in RUE. No change in grip strength.  She has tried tylenol w/o relief. She takes flexeril 10mg  qHS.  She snores, daytime fatigue. She can sleep 14hrs and still be tired.    Past Medical History:  Diagnosis Date   Arthritis    Chronic back pain    Degenerative spinal arthritis    Dercum's disease    Fibromyalgia    Migraines     Past Surgical History:  Procedure Laterality Date   APPENDECTOMY     BACK SURGERY     CHOLECYSTECTOMY     SPINAL FUSION     TONSILLECTOMY      Social History   Socioeconomic History   Marital status: Divorced    Spouse name: Not on file   Number of children: 3   Years of education: Not on file   Highest education level: Not on file  Occupational History   Not on file  Tobacco Use   Smoking status: Some Days    Packs/day: 0.50    Years: 23.00    Pack years: 11.50    Types: Cigarettes    Start date: 49   Smokeless tobacco: Never  Vaping Use   Vaping Use: Never used  Substance and Sexual Activity   Alcohol use: No   Drug use: No   Sexual activity: Yes  Other Topics Concern   Not on file  Social History Narrative   Not on file   Social Determinants of Health   Financial Resource Strain: Low Risk    Difficulty of Paying Living Expenses: Not hard at all  Food Insecurity: No Food Insecurity   Worried About Charity fundraiser in the Last  Year: Never true   Ran Out of Food in the Last Year: Never true  Transportation Needs: No Transportation Needs   Lack of Transportation (Medical): No   Lack of Transportation (Non-Medical): No  Physical Activity: Inactive   Days of Exercise per Week: 0 days   Minutes of Exercise per Session: 0 min  Stress: Stress Concern Present   Feeling of Stress : To some extent  Social Connections: Moderately Isolated   Frequency of Communication with Friends and Family: More than three times a week   Frequency of Social Gatherings with Friends and Family: More than three times a week   Attends Religious Services: More than 4 times per year   Active Member of [employer] or Organizations: No   Attends Archivist Meetings: Never   Marital Status: Divorced  Human resources officer Violence: Not At Risk   Fear of Current or Ex-Partner: No   Emotionally Abused: No   Physically Abused: No   Sexually Abused: No    Family History  Problem Relation Age of Onset   Aneurysm Mother    Lung cancer  Father    Heart disease Brother        CABG at 56, fatal MI at 75   Heart disease Brother      Immunization History  Administered Date(s) Administered   Influenza-Unspecified 07/12/2020   PFIZER(Purple Top)SARS-COV-2 Vaccination 12/17/2019, 01/11/2020, 07/12/2020    Outpatient Encounter Medications as of 04/19/2021  Medication Sig   amphetamine-dextroamphetamine (ADDERALL) 20 MG tablet    Artificial Saliva (BIOTENE DRY MOUTH) GUM Use as directed in the mouth or throat.   buprenorphine (BUTRANS) 10 MCG/HR PTWK 1 patch once a week.   cyclobenzaprine (FLEXERIL) 10 MG tablet TAKE 1 TABLET BY MOUTH EVERYDAY AT BEDTIME   EUCALYPTUS OIL EX Apply topically.   fluticasone (FLONASE) 50 MCG/ACT nasal spray Place into both nostrils daily.   gabapentin (NEURONTIN) 600 MG tablet TAKE TWO TABLETS BY MOUTH 3 TIMES A DAY   ibuprofen (ADVIL) 600 MG tablet Take 600 mg by mouth 3 (three) times daily.   Melatonin 3 MG TABS  Take by mouth.   meloxicam (MOBIC) 15 MG tablet Take 1 tablet by mouth daily.   traZODone (DESYREL) 100 MG tablet Take 100-200 mg by mouth at bedtime.   Vitamin D, Ergocalciferol, (DRISDOL) 1.25 MG (50000 UNIT) CAPS capsule Take 1 capsule (50,000 Units total) by mouth every 7 (seven) days.   No facility-administered encounter medications on file as of 04/19/2021.     ROS: Pertinent positives and negatives noted in HPI. Remainder of ROS non-contributory    Allergies  Allergen Reactions   Quetiapine Fumarate Nausea Only    Nausea, headache and lethargy   Seroquel [Quetiapine Fumarate] Nausea Only    Nausea, headache and lethargy    BP 108/60 (BP Location: Left Arm, Patient Position: Sitting, Cuff Size: Normal)   Pulse (!) 102   Temp 98.9 F (37.2 C) (Temporal)   Ht 5\' 4"  (1.626 m)   Wt 227 lb 12.8 oz (103.3 kg)   LMP 07/17/2014 Comment: Pt stated that she is going through menopause and that she will go for 6-8 months without a period and then have it twice a month.   SpO2 97%   BMI 39.10 kg/m  Wt Readings from Last 3 Encounters:  04/19/21 227 lb 12.8 oz (103.3 kg)  02/23/21 234 lb 3.2 oz (106.2 kg)  02/07/21 233 lb 8 oz (105.9 kg)   Temp Readings from Last 3 Encounters:  04/19/21 98.9 F (37.2 C) (Temporal)  02/23/21 (!) 96.8 F (36 C) (Temporal)  02/06/21 98 F (36.7 C) (Oral)   BP Readings from Last 3 Encounters:  04/19/21 108/60  02/07/21 (!) 140/96  11/03/20 134/76   Pulse Readings from Last 3 Encounters:  04/19/21 (!) 102  02/23/21 (!) 105  02/07/21 90     Physical Exam Constitutional:      General: She is not in acute distress.    Appearance: She is not ill-appearing.  Musculoskeletal:     Cervical back: Muscular tenderness present. Decreased range of motion.     Thoracic back: Spasms and tenderness present. No swelling, deformity or bony tenderness.  Neurological:     Mental Status: She is alert and oriented to person, place, and time.   Psychiatric:        Mood and Affect: Mood normal.     A/P:  1. Musculoskeletal back pain - heating pad TID followed by stretching/ROM exercises at least 1x/day - pt takes meloxicam 15mg  daily and will continue this; tylenol PRN Rx: - cyclobenzaprine (FLEXERIL) 10 MG tablet; Take  1 tablet (10 mg total) by mouth 3 (three) times daily as needed for muscle spasms.  Dispense: 30 tablet; Refill: 0 - pt to take BID-TID x 3-5 days then resume qHS dosing. Pt states is does not make her sleepy/tired  2. Snoring 3. Chronic fatigue 4. Adult BMI 39.0-39.9 kg/sq m 5. Class 2 obesity due to excess calories with body mass index (BMI) of 39.0 to 39.9 in adult - Ambulatory referral to Pulmonology - fatigue is also likely in part d/t chronic pain, no regular exercise/activity, pain medication  This visit occurred during the SARS-CoV-2 public health emergency.  Safety protocols were in place, including screening questions prior to the visit, additional usage of staff PPE, and extensive cleaning of exam room while observing appropriate contact time as indicated for disinfecting solutions.

## 2021-04-21 DIAGNOSIS — E6609 Other obesity due to excess calories: Secondary | ICD-10-CM | POA: Insufficient documentation

## 2021-04-21 DIAGNOSIS — R5382 Chronic fatigue, unspecified: Secondary | ICD-10-CM | POA: Insufficient documentation

## 2021-04-27 ENCOUNTER — Telehealth: Payer: Self-pay | Admitting: Family Medicine

## 2021-04-27 ENCOUNTER — Other Ambulatory Visit: Payer: Self-pay | Admitting: Family Medicine

## 2021-04-27 DIAGNOSIS — M549 Dorsalgia, unspecified: Secondary | ICD-10-CM

## 2021-04-27 NOTE — Telephone Encounter (Signed)
To send rx for flonase with 5 RF

## 2021-04-27 NOTE — Telephone Encounter (Signed)
Pt is on a pain patch, but she has an allergic reaction to the patch. Her pain management doc told her to get Flonase and spray on her arm before she puts the pain patch on. She is on disability and  can not afford the 2 bottles a month. She is wanting a script for this and she can get this much cheaper. Pharmacy  [id number]- [place], [place] [id number] Phone:  [phone] Fax:  [phone] DEA #:  [id number]  Please advise pt at [phone]

## 2021-04-27 NOTE — Telephone Encounter (Signed)
The patient is wanting to [facility] to you because Letta Median is leaving and she wants to stay with [place] and have a provider that speaks spanish.  Can I schedule this patient?

## 2021-04-28 NOTE — Telephone Encounter (Signed)
LMVM for the patient to contact the office to schedule a TOC appointment with Dr. [name]

## 2021-04-28 NOTE — Telephone Encounter (Signed)
Okay to schedule

## 2021-05-02 MED ORDER — FLUTICASONE PROPIONATE 50 MCG/ACT NA SUSP: 2.0000 | Freq: Every day | NASAL | 5 refills | Status: DC

## 2021-05-02 NOTE — Telephone Encounter (Signed)
Lft VM that RX was sent to the pharmacy for her to pick up. Dm/cma

## 2021-05-18 ENCOUNTER — Other Ambulatory Visit: Payer: Self-pay | Admitting: Family Medicine

## 2021-05-18 DIAGNOSIS — M549 Dorsalgia, unspecified: Secondary | ICD-10-CM

## 2021-05-18 NOTE — Telephone Encounter (Signed)
For Doc of the day, Dr Lurline Del pt requesting a refill on Flexeril.

## 2021-05-19 NOTE — Telephone Encounter (Signed)
Refill request for pending Rx patient of Dr. Loletha Grayer last [facility] and refill 04/19/21. Please advise

## 2021-05-22 ENCOUNTER — Telehealth: Payer: Self-pay

## 2021-05-22 NOTE — Telephone Encounter (Signed)
Refill request for: Cyclobezaprine 10 mg LR  04/19/21, #30, 0 rf LOV 04/19/21 FOV  none scheduled.     [facility] [address]  Please review and advise.  Thanks. Dm/cma

## 2021-05-22 NOTE — Telephone Encounter (Signed)
Lft VM to rtn call if any questions about detailed message left. Dm/cma

## 2021-05-23 ENCOUNTER — Other Ambulatory Visit (INDEPENDENT_AMBULATORY_CARE_PROVIDER_SITE_OTHER): Payer: Medicare Other

## 2021-05-23 ENCOUNTER — Other Ambulatory Visit: Payer: Self-pay

## 2021-05-23 DIAGNOSIS — R7301 Impaired fasting glucose: Secondary | ICD-10-CM | POA: Diagnosis not present

## 2021-05-23 LAB — HEMOGLOBIN A1C: Hgb A1c MFr Bld: 6.3 % (ref 4.6–6.5)

## 2021-05-30 NOTE — Telephone Encounter (Signed)
Refill denied 

## 2021-06-07 ENCOUNTER — Other Ambulatory Visit: Payer: Self-pay

## 2021-06-07 ENCOUNTER — Encounter: Payer: Self-pay | Admitting: Pulmonary Disease

## 2021-06-07 ENCOUNTER — Ambulatory Visit (INDEPENDENT_AMBULATORY_CARE_PROVIDER_SITE_OTHER): Payer: Medicare Other | Admitting: Pulmonary Disease

## 2021-06-07 VITALS — BP 110/80 | HR 96 | Temp 98.5°F | Ht 63.5 in | Wt 227.2 lb

## 2021-06-07 DIAGNOSIS — R400 Somnolence: Secondary | ICD-10-CM

## 2021-06-07 NOTE — Patient Instructions (Signed)
Moderate probability of significant obstructive sleep apnea  We will schedule him for an in lab split-night study  Continue weight loss efforts  Call with significant concerns obstructive sleep apnea   Sleep Apnea Sleep apnea affects breathing during sleep. It causes breathing to stop for 10 seconds or more, or to become shallow. People with sleep apnea usually snore loudly. It can also increase the risk of: Heart attack. Stroke. Being very overweight (obese). Diabetes. Heart failure. Irregular heartbeat. High blood pressure. The goal of treatment is to help you breathe normally again. What are the causes? The most common cause of this condition is a collapsed or blocked airway. There are three kinds of sleep apnea: Obstructive sleep apnea. This is caused by a blocked or collapsed airway. Central sleep apnea. This happens when the brain does not send the right signals to the muscles that control breathing. Mixed sleep apnea. This is a combination of obstructive and central sleep apnea. What increases the risk? Being overweight. Smoking. Having a small airway. Being older. Being female. Drinking alcohol. Taking medicines to calm yourself (sedatives or tranquilizers). Having family members with the condition. Having a tongue or tonsils that are larger than normal. What are the signs or symptoms? Trouble staying asleep. Loud snoring. Headaches in the morning. Waking up gasping. Dry mouth or sore throat in the morning. Being sleepy or tired during the day. If you are sleepy or tired during the day, you may also: Not be able to focus your mind (concentrate). Forget things. Get angry a lot and have mood swings. Feel sad (depressed). Have changes in your personality. Have less interest in sex, if you are female. Be unable to have an erection, if you are female. How is this treated?  Sleeping on your side. Using a medicine to get rid of mucus in your nose  (decongestant). Avoiding the use of alcohol, medicines to help you relax, or certain pain medicines (narcotics). Losing weight, if needed. Changing your diet. Quitting smoking. Using a machine to open your airway while you sleep, such as: An oral appliance. This is a mouthpiece that shifts your lower jaw forward. A CPAP device. This device blows air through a mask when you breathe out (exhale). An EPAP device. This has valves that you put in each nostril. A BPAP device. This device blows air through a mask when you breathe in (inhale) and breathe out. Having surgery if other treatments do not work. Follow these instructions at home: Lifestyle Make changes that your doctor recommends. Eat a healthy diet. Lose weight if needed. Avoid alcohol, medicines to help you relax, and some pain medicines. Do not smoke or use any products that contain nicotine or tobacco. If you need help quitting, ask your doctor. General instructions Take over-the-counter and prescription medicines only as told by your doctor. If you were given a machine to use while you sleep, use it only as told by your doctor. If you are having surgery, make sure to tell your doctor you have sleep apnea. You may need to bring your device with you. Keep all follow-up visits. Contact a doctor if: The machine that you were given to use during sleep bothers you or does not seem to be working. You do not get better. You get worse. Get help right away if: Your chest hurts. You have trouble breathing in enough air. You have an uncomfortable feeling in your back, arms, or stomach. You have trouble talking. One side of your body feels weak. A part  of your face is hanging down. These symptoms may be an emergency. Get help right away. Call your local emergency services (911 in the [place] Do not wait to see if the symptoms will go away. Do not drive yourself to the hospital. Summary This condition affects breathing during  sleep. The most common cause is a collapsed or blocked airway. The goal of treatment is to help you breathe normally while you sleep. This information is not intended to replace advice given to you by your health care provider. Make sure you discuss any questions you have with your health care provider. Document Revised: 08/26/2020 Document Reviewed: 08/26/2020 Elsevier Patient Education  2022 [employer].

## 2021-06-07 NOTE — Progress Notes (Signed)
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 Rarely headache in the morning  Has lost about 15 pounds recently, has been trying to get more active and changing her diet  Mom did snore  Usually goes to bed between 12 AM and 2 AM 15 to 30 minutes to fall asleep 1-2 awakenings Gets out of bed between 10 and 12  On multiple medications for chronic discomfort Recently off hydrocodone, On buprenorphine, Adderall, trazodone 100  Has a pet dog  Cutting down on smoking    Outpatient Encounter Medications as of 06/07/2021  Medication Sig   amphetamine-dextroamphetamine (ADDERALL) 20 MG tablet    Artificial Saliva (BIOTENE DRY MOUTH) GUM Use as directed in the mouth or throat.   buprenorphine (BUTRANS) 10 MCG/HR PTWK 1 patch once a week.   cyclobenzaprine (FLEXERIL) 10 MG tablet Take 1 tablet (10 mg total) by mouth 3 (three) times daily as needed for muscle spasms.   EUCALYPTUS OIL EX Apply topically.   fluticasone (FLONASE) 50 MCG/ACT nasal spray Place 2 sprays into both nostrils daily.   gabapentin (NEURONTIN) 600 MG tablet TAKE TWO TABLETS BY MOUTH 3 TIMES A DAY   ibuprofen (ADVIL) 600 MG tablet Take 600 mg by mouth 3 (three) times daily.   Melatonin 3 MG TABS Take by mouth.   meloxicam (MOBIC) 15 MG tablet Take 1 tablet by mouth daily.   traZODone (DESYREL) 100 MG tablet Take 100-200 mg by mouth at bedtime.   Vitamin D, Ergocalciferol, (DRISDOL) 1.25 MG (50000 UNIT)  CAPS capsule Take 1 capsule (50,000 Units total) by mouth every 7 (seven) days.   No facility-administered encounter medications on file as of 06/07/2021.    Allergies as of 06/07/2021 - Review Complete 06/07/2021  Allergen Reaction Noted   Quetiapine fumarate Nausea Only 08/19/2014   Seroquel [quetiapine fumarate] Nausea Only 08/19/2014    Past Medical History:  Diagnosis Date   Arthritis    Chronic back pain    Degenerative spinal arthritis    Dercum's disease    Fibromyalgia    Migraines     Past Surgical History:  Procedure Laterality Date   APPENDECTOMY     BACK SURGERY     CHOLECYSTECTOMY     SPINAL FUSION     TONSILLECTOMY      Family History  Problem Relation Age of Onset   Aneurysm Mother    Lung cancer Father    Heart disease Brother        CABG at 48, fatal MI at 40   Heart disease Brother     Social History   Socioeconomic History   Marital status: Divorced    Spouse name: Not on file   Number of children: 3   Years of education: Not on file   Highest education level: Not on file  Occupational History   Not on file  Tobacco Use   Smoking status: Some Days    Packs/day: 0.50  Years: 23.00    Pack years: 11.50    Types: Cigarettes    Start date: 30   Smokeless tobacco: Never  Vaping Use   Vaping Use: Never used  Substance and Sexual Activity   Alcohol use: No   Drug use: No   Sexual activity: Yes  Other Topics Concern   Not on file  Social History Narrative   Not on file   Social Determinants of Health   Financial Resource Strain: Low Risk    Difficulty of Paying Living Expenses: Not hard at all  Food Insecurity: No Food Insecurity   Worried About Charity fundraiser in the Last Year: Never true   [place] in the Last Year: Never true  Transportation Needs: No Transportation Needs   Lack of Transportation (Medical): No   Lack of Transportation (Non-Medical): No  Physical Activity: Inactive   Days of Exercise per Week: 0  days   Minutes of Exercise per Session: 0 min  Stress: Stress Concern Present   Feeling of Stress : To some extent  Social Connections: Moderately Isolated   Frequency of Communication with Friends and Family: More than three times a week   Frequency of Social Gatherings with Friends and Family: More than three times a week   Attends Religious Services: More than 4 times per year   Active Member of [employer] or Organizations: No   Attends Archivist Meetings: Never   Marital Status: Divorced  Human resources officer Violence: Not At Risk   Fear of Current or Ex-Partner: No   Emotionally Abused: No   Physically Abused: No   Sexually Abused: No    Review of Systems  Constitutional:  Positive for fatigue.  Respiratory:  Negative for apnea and shortness of breath.   Psychiatric/Behavioral:  Positive for sleep disturbance.    Vitals:   06/07/21 1537  BP: 110/80  Pulse: 96  Temp: 98.5 F (36.9 C)  SpO2: 96%     Physical Exam Constitutional:      Appearance: She is obese.  HENT:     Head: Normocephalic.     Nose: No congestion.     Mouth/Throat:     Mouth: Mucous membranes are moist.     Comments: Mallampati 3, crowded oropharynx Eyes:     Pupils: Pupils are equal, round, and reactive to light.  Cardiovascular:     Rate and Rhythm: Normal rate and regular rhythm.     Heart sounds: No murmur heard.   No friction rub.  Pulmonary:     Effort: No respiratory distress.     Breath sounds: No stridor. No wheezing or rhonchi.  Musculoskeletal:     Cervical back: No rigidity or tenderness.  Neurological:     Mental Status: She is alert.  Psychiatric:        Mood and Affect: Mood normal.   Epworth Sleepiness Scale of 4  Data Reviewed: Most recent CBC, Chem-7 within normal limits 02/06/2021  Assessment:  Moderate probability of significant obstructive sleep apnea  Obesity  Dercum disease  Smoker -She is cutting down and trying to quit  Pathophysiology of sleep  disordered breathing discussed with the patient Treatment options for sleep disordered breathing discussed with the patient  Plan/Recommendations: We will schedule the patient for an in lab split-night study  Weight loss measures encouraged  Graded exercise as tolerated  Smoking cessation counseling  Tentative follow-up in 3 to 4 months  Encouraged to call with any significant concerns  Sherrilyn Rist MD [facility] 06/07/2021, 4:00 PM  CC: Ronnald Nian, DO

## 2021-06-09 ENCOUNTER — Other Ambulatory Visit: Payer: Self-pay

## 2021-06-09 ENCOUNTER — Encounter: Payer: Self-pay | Admitting: Family Medicine

## 2021-06-09 ENCOUNTER — Ambulatory Visit (INDEPENDENT_AMBULATORY_CARE_PROVIDER_SITE_OTHER): Payer: Medicare Other | Admitting: Family Medicine

## 2021-06-09 VITALS — BP 124/80 | HR 91 | Resp 16 | Ht 63.5 in | Wt 228.0 lb

## 2021-06-09 DIAGNOSIS — R7303 Prediabetes: Secondary | ICD-10-CM

## 2021-06-09 DIAGNOSIS — K921 Melena: Secondary | ICD-10-CM | POA: Diagnosis not present

## 2021-06-09 DIAGNOSIS — Z23 Encounter for immunization: Secondary | ICD-10-CM

## 2021-06-09 DIAGNOSIS — F32A Depression, unspecified: Secondary | ICD-10-CM

## 2021-06-09 DIAGNOSIS — E785 Hyperlipidemia, unspecified: Secondary | ICD-10-CM

## 2021-06-09 DIAGNOSIS — F419 Anxiety disorder, unspecified: Secondary | ICD-10-CM

## 2021-06-09 DIAGNOSIS — R7301 Impaired fasting glucose: Secondary | ICD-10-CM

## 2021-06-09 DIAGNOSIS — E559 Vitamin D deficiency, unspecified: Secondary | ICD-10-CM | POA: Diagnosis not present

## 2021-06-09 DIAGNOSIS — M797 Fibromyalgia: Secondary | ICD-10-CM

## 2021-06-09 NOTE — Progress Notes (Signed)
HPI: [name] is a 55 y.o. female, who is here today to establish care. Former PCP: Dr Bryan Lemma. According to pt, her former PCP have left the practice. She has hx of fibromyalgia,generalized OA,IFG, back pain s/p lumbar spinal fusion, and dercum disease among some. Chronic pain,on disability. She follows with pain management every 5-6 months now. Generalized pain, constant, exacerbated by cold weather. She is on Buprenorphine 10 mg patch weekly, which helps. She is also taking Gabapentin and Meloxicam.  States that she took Hydrocodone-Acetaminophen for about 7 years, d/c because was causing central sleep apnea. She has seen Dr Ander Slade and pending sleep study in 07/2021.  Sometimes she is in bed for 3 days due to pain. Follows with pain management q 6 months now.  Multiple tender lipomas (adiposis dolorosa/dercum disease), she is looking for a plastic surgeon that performs liposuction,"reconstruction surgery" hoping these treatments may help with adipose tender areas.   Prediabetes: Negative for polydipsia,polyuria, or polyphagia.  Currently she is not exercising regularly but she is planning on starting biking again and sign for pool exercises at the [facility]. Lab Results  Component Value Date   HGBA1C 6.3 05/23/2021   She has decreased sugar and carbs intake and has already noted wt loss. She is hoping that this will help with diabetes prevention. She is not interested in adding more medications.  HLD:She is on non pharmacologic treatment.  Lab Results  Component Value Date   CHOL 181 12/06/2020   HDL 42.20 12/06/2020   LDLCALC 103 (H) 12/06/2020   TRIG 183.0 (H) 12/06/2020   CHOLHDL 4 12/06/2020   Concerned about 2 episodes of red blood mixed with her stool within the past 2-3 weeks. Negative for blood on tissue or dyschezia. Colonoscopy at [facility] on 05/15/16. She would like establish with new GI.  Hx of constipation, it is intermittent,well  controlled with OTC stool softener.  Negative for associated abdominal pain, nausea, vomiting, changes in bowel habits, or melena.  Bowel movements 2-3 times per day.  She takes daily iron supplementation. Negative for nose bleed,gross hematuria,or bruising. Tobacco use disorder: Smoking about 1 PPD. Started smoking at age 81, intermittently. Stopped for 7 years about 30 years ago. 4-5 packs per month. She had chest CTA on 02/06/21: Negative otherwise.  Lab Results  Component Value Date   WBC 9.5 02/06/2021   HGB 15.1 (H) 02/06/2021   HCT 46.3 (H) 02/06/2021   MCV 95.3 02/06/2021   PLT 356 02/06/2021   Vit D deficiency: She is on Ergocalciferol 50,000 U weekly, which she forgets frequently. She is also taking a daily multivitamin and Ca++ and vit. Last 25 OH vit D 13.3.  Depression and anxiety: She follows with psychiatrist annually and prn. She sees psychotherapist q 2 weeks. She is on Trazodone 100 mg at bedtime that has helped her with sleep. She has a good family support.  Review of Systems  Constitutional:  Positive for fatigue. Negative for activity change, appetite change and fever.  HENT:  Negative for mouth sores, nosebleeds, sore throat and trouble swallowing.   Eyes:  Negative for redness and visual disturbance.  Respiratory:  Negative for cough, shortness of breath and wheezing.   Cardiovascular:  Negative for chest pain, palpitations and leg swelling.  Genitourinary:  Negative for decreased urine volume and dysuria.  Musculoskeletal:  Positive for arthralgias, back pain and myalgias.  Skin:  Negative for rash and wound.  Allergic/Immunologic: Positive for environmental allergies.  Neurological:  Negative  for syncope, facial asymmetry and weakness.  Psychiatric/Behavioral:  Positive for sleep disturbance. Negative for confusion. The patient is nervous/anxious.   Rest of ROS, see pertinent positives sand negatives in HPI  Current Outpatient Medications on File Prior  to Visit  Medication Sig Dispense Refill   Artificial Saliva (BIOTENE DRY MOUTH) GUM Use as directed in the mouth or throat.     buprenorphine (BUTRANS) 10 MCG/HR PTWK 1 patch once a week.     EUCALYPTUS OIL EX Apply topically.     fluticasone (FLONASE) 50 MCG/ACT nasal spray Place 2 sprays into both nostrils daily. 16 g 5   gabapentin (NEURONTIN) 600 MG tablet TAKE TWO TABLETS BY MOUTH 3 TIMES A DAY     Melatonin 3 MG TABS Take by mouth.     meloxicam (MOBIC) 15 MG tablet Take 1 tablet by mouth daily.     traZODone (DESYREL) 100 MG tablet Take 100-200 mg by mouth at bedtime.     Vitamin D, Ergocalciferol, (DRISDOL) 1.25 MG (50000 UNIT) CAPS capsule Take 1 capsule (50,000 Units total) by mouth every 7 (seven) days. 5 capsule 3   No current facility-administered medications on file prior to visit.   Past Medical History:  Diagnosis Date   Arthritis    Chronic back pain    Degenerative spinal arthritis    Dercum's disease    Fibromyalgia    Migraines    Allergies  Allergen Reactions   Quetiapine Fumarate Nausea Only    Nausea, headache and lethargy   Seroquel [Quetiapine Fumarate] Nausea Only    Nausea, headache and lethargy   Social History   Socioeconomic History   Marital status: Divorced    Spouse name: Not on file   Number of children: 3   Years of education: Not on file   Highest education level: Not on file  Occupational History   Not on file  Tobacco Use   Smoking status: Some Days    Packs/day: 0.50    Years: 23.00    Pack years: 11.50    Types: Cigarettes    Start date: 73   Smokeless tobacco: Never  Vaping Use   Vaping Use: Never used  Substance and Sexual Activity   Alcohol use: No   Drug use: No   Sexual activity: Yes  Other Topics Concern   Not on file  Social History Narrative   Not on file   Social Determinants of Health   Financial Resource Strain: Low Risk    Difficulty of Paying Living Expenses: Not hard at all  Food Insecurity: No  Food Insecurity   Worried About Charity fundraiser in the Last Year: Never true   Ran Out of Food in the Last Year: Never true  Transportation Needs: No Transportation Needs   Lack of Transportation (Medical): No   Lack of Transportation (Non-Medical): No  Physical Activity: Inactive   Days of Exercise per Week: 0 days   Minutes of Exercise per Session: 0 min  Stress: Stress Concern Present   Feeling of Stress : To some extent  Social Connections: Moderately Isolated   Frequency of Communication with Friends and Family: More than three times a week   Frequency of Social Gatherings with Friends and Family: More than three times a week   Attends Religious Services: More than 4 times per year   Active Member of [employer] or Organizations: No   Attends Archivist Meetings: Never   Marital Status: Divorced   Vitals:  06/09/21 1529  BP: 124/80  Pulse: 91  Resp: 16  SpO2: 98%   Body mass index is 39.75 kg/m.  Physical Exam Vitals and nursing note reviewed.  Constitutional:      General: She is not in acute distress.    Appearance: She is well-developed.  HENT:     Head: Normocephalic and atraumatic.     Mouth/Throat:     Mouth: Mucous membranes are moist.     Pharynx: Oropharynx is clear.  Eyes:     Conjunctiva/sclera: Conjunctivae normal.  Cardiovascular:     Rate and Rhythm: Normal rate and regular rhythm.     Pulses:          Dorsalis pedis pulses are 2+ on the right side and 2+ on the left side.     Heart sounds: No murmur heard. Pulmonary:     Effort: Pulmonary effort is normal. No respiratory distress.     Breath sounds: Normal breath sounds.  Abdominal:     Palpations: Abdomen is soft. There is no hepatomegaly or mass.     Tenderness: There is no abdominal tenderness.  Genitourinary:    Comments: Rectal exam deferred to GI. Musculoskeletal:     Comments: Generalized tender points and adipose tissue confluent around elbows and waist.  Lymphadenopathy:      Cervical: No cervical adenopathy.  Skin:    General: Skin is warm.     Findings: No erythema or rash.  Neurological:     General: No focal deficit present.     Mental Status: She is alert and oriented to person, place, and time.     Cranial Nerves: No cranial nerve deficit.     Gait: Gait normal.   ASSESSMENT AND PLAN:  Ms. Darcell Swasey was seen today for establishing care.  Orders Placed This Encounter  Procedures   Flu Vaccine QUAD 45moIM (Fluarix, Fluzone & Alfiuria Quad PF)   Pneumococcal polysaccharide vaccine 23-valent greater than or equal to 2yo subcutaneous/IM   CBC   VITAMIN D 25 Hydroxy (Vit-D Deficiency, Fractures)   Ambulatory referral to Gastroenterology   Prediabetes A healthy life style and wt loss encouraged for diabetes prevention. Will plan on HgA1C next visit.  Vitamin D deficiency Continue current dose pf vit D supplementation. Further recommendations will be given according to 25 OH vit D result.  Fibromyalgia + Generalized OA and Dercum disease. Low impact exercise and good sleep hygiene recommended. Pain is adequately controlled with Gabapentin and Buprenorphine. Following with pain management.  Blood in stool Last episode a few days ago. GI referral placed. I do not have report of last colonoscopy. Clearly instructed about warning signs.  Anxiety and depression Stable. Following with psychotherapist and psychiatrist regularly.  Hyperlipidemia, unspecified hyperlipidemia type Continue non pharmacologic treatment. The 10-year ASCVD risk score (Arnett DK, et al., 2019) is: 5.9%   Values used to calculate the score:     Age: [age]years     Sex: Female     Is Non-Hispanic African American: No     Diabetic: No     Tobacco smoker: Yes     Systolic Blood Pressure: [id number]     Is BP treated: No     HDL Cholesterol: 42.2 mg/dL     Total Cholesterol: 181 mg/dL  Morbid obesity (HCC) BMI 39.7 with co-morbilities. She understands the  benefits of wt loss as well as adverse effects of obesity. She has already lost some wt through dietary changes. Consistency with healthy diet  and physical activity encouraged.  Need for influenza vaccination -     Flu Vaccine QUAD 33moIM (Fluarix, Fluzone & Alfiuria Quad PF)  Need for pneumococcal vaccination -     Pneumococcal polysaccharide vaccine 23-valent greater than or equal to 2yo subcutaneous/IM   I spent a total of 45 minutes in both face to face and non face to face activities for this visit on the date of this encounter. During this time history was obtained and documented, examination was performed, prior labs/imaging reviewed, and assessment/plan discussed.  Return in about 4 months (around 10/09/2021).  Kailer Heindel G. JMartinique MD  [facility].

## 2021-06-09 NOTE — Patient Instructions (Addendum)
A few things to remember from today's visit:   Vitamin D deficiency - Plan: VITAMIN D 25 Hydroxy (Vit-D Deficiency, Fractures)  Fibromyalgia  Elevated fasting glucose  Blood in stool - Plan: Ambulatory referral to Gastroenterology, CBC  If you need refills please call your pharmacy. Do not use My Chart to request refills or for acute issues that need immediate attention.   No changes today. Continue working on following a healthful diet and regular physical activity, low impact. Please be sure medication list is accurate. If a new problem present, please set up appointment sooner than planned today.

## 2021-06-10 ENCOUNTER — Encounter: Payer: Self-pay | Admitting: Family Medicine

## 2021-06-13 ENCOUNTER — Other Ambulatory Visit: Payer: Medicare Other

## 2021-06-16 ENCOUNTER — Emergency Department (HOSPITAL_BASED_OUTPATIENT_CLINIC_OR_DEPARTMENT_OTHER)
Admission: EM | Admit: 2021-06-16 | Discharge: 2021-06-16 | Disposition: A | Discharge status: Home / Self Care | Payer: Medicare Other | Attending: Emergency Medicine | Admitting: Emergency Medicine

## 2021-06-16 ENCOUNTER — Encounter (HOSPITAL_BASED_OUTPATIENT_CLINIC_OR_DEPARTMENT_OTHER): Payer: Self-pay

## 2021-06-16 ENCOUNTER — Other Ambulatory Visit: Payer: Self-pay

## 2021-06-16 DIAGNOSIS — T8052XA Anaphylactic reaction due to vaccination, initial encounter: Secondary | ICD-10-CM | POA: Diagnosis not present

## 2021-06-16 DIAGNOSIS — R5383 Other fatigue: Secondary | ICD-10-CM | POA: Insufficient documentation

## 2021-06-16 DIAGNOSIS — T50Z95A Adverse effect of other vaccines and biological substances, initial encounter: Secondary | ICD-10-CM

## 2021-06-16 DIAGNOSIS — R21 Rash and other nonspecific skin eruption: Secondary | ICD-10-CM | POA: Diagnosis not present

## 2021-06-16 DIAGNOSIS — M791 Myalgia, unspecified site: Secondary | ICD-10-CM | POA: Diagnosis not present

## 2021-06-16 DIAGNOSIS — F17210 Nicotine dependence, cigarettes, uncomplicated: Secondary | ICD-10-CM | POA: Insufficient documentation

## 2021-06-16 DIAGNOSIS — R509 Fever, unspecified: Secondary | ICD-10-CM | POA: Insufficient documentation

## 2021-06-16 DIAGNOSIS — T7840XA Allergy, unspecified, initial encounter: Secondary | ICD-10-CM | POA: Diagnosis present

## 2021-06-16 NOTE — ED Provider Notes (Signed)
[place] EMERGENCY DEPT Provider Note   CSN: [id number] Arrival date & time: 06/16/21  1630     History Chief Complaint  Patient presents with   Allergic Reaction    [name] is a 55 y.o. female.  55 yo F with a chief complaints of a possible reaction to her flu and pneumococcal vaccination.  She got that done about a week ago.  Since then she had had significant erythema and swelling and pain to the injection site.  She also developed flulike symptoms for about 3 or 4 days.  The symptoms have resolved over the past 24 to 48 hours.  She has also started having improvement of the spreading of redness.  Is still focally tender right over the area.  She had both vaccinations placed in the same location because she has a medication patch on the other shoulder.  The history is provided by the patient.  Allergic Reaction Presenting symptoms: rash   Presenting symptoms: no wheezing   Illness Severity:  Moderate Onset quality:  Gradual Duration:  1 week Timing:  Constant Progression:  Partially resolved Chronicity:  New Associated symptoms: fatigue, fever, myalgias and rash   Associated symptoms: no chest pain, no congestion, no headaches, no nausea, no rhinorrhea, no shortness of breath, no vomiting and no wheezing       Past Medical History:  Diagnosis Date   Arthritis    Chronic back pain    Degenerative spinal arthritis    Dercum's disease    Fibromyalgia    Migraines     Patient Active Problem List   Diagnosis Date Noted   Morbid obesity ([place]) 04/21/2021   Chronic fatigue 04/21/2021   Vitamin D deficiency 12/08/2020   Elevated fasting glucose 12/08/2020   Benign microscopic hematuria 11/03/2020   Exertional dyspnea 06/12/2019   Family history of early CAD 06/12/2019   Anxiety and depression 02/13/2019   History of migraine 02/13/2019   HLD (hyperlipidemia) 02/13/2019   Diverticulosis of colon 02/13/2019   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     Family History  Problem Relation Age of Onset   Aneurysm Mother    Lung cancer Father    Heart disease Brother        CABG at 43, fatal MI at 55   Heart disease Brother     Social History   Tobacco Use   Smoking status: Some Days    Packs/day: 0.50    Years: 23.00    Pack years: 11.50    Types: Cigarettes    Start date: 63   Smokeless tobacco: Never  Vaping Use   Vaping Use: Never used  Substance Use Topics   Alcohol use: No   Drug use: No    Home Medications Prior to Admission medications   Medication Sig Start Date End Date Taking? Authorizing Provider  Artificial Saliva (BIOTENE DRY MOUTH) GUM Use as directed in the mouth or throat.    [provider]  buprenorphine (BUTRANS) 10 MCG/HR PTWK 1 patch once a week. 01/30/21   [provider]  EUCALYPTUS OIL EX Apply topically.    [provider]  fluticasone (  FLONASE) 50 MCG/ACT nasal spray Place 2 sprays into both nostrils daily. 05/02/21   Cirigliano, Mary K, DO  gabapentin (NEURONTIN) 600 MG tablet TAKE TWO TABLETS BY MOUTH 3 TIMES A DAY 01/23/19   [provider]  Melatonin 3 MG TABS Take by mouth.    [provider]  meloxicam (MOBIC) 15 MG tablet Take 1 tablet by mouth daily. 02/16/21   [provider]  traZODone (DESYREL) 100 MG tablet Take 100-200 mg by mouth at bedtime. 04/04/21   [provider]  Vitamin D, Ergocalciferol, (DRISDOL) 1.25 MG (50000 UNIT) CAPS capsule Take 1 capsule (50,000 Units total) by mouth every 7 (seven) days. 12/08/20   Cirigliano, Garvin Fila, DO    Allergies    Quetiapine fumarate and Seroquel [quetiapine fumarate]  Review of Systems   Review of Systems  Constitutional:   Positive for fatigue and fever. Negative for chills.  HENT:  Negative for congestion and rhinorrhea.   Eyes:  Negative for redness and visual disturbance.  Respiratory:  Negative for shortness of breath and wheezing.   Cardiovascular:  Negative for chest pain and palpitations.  Gastrointestinal:  Negative for nausea and vomiting.  Genitourinary:  Negative for dysuria and urgency.  Musculoskeletal:  Positive for myalgias. Negative for arthralgias.  Skin:  Positive for rash. Negative for pallor and wound.  Neurological:  Negative for dizziness and headaches.   Physical Exam Updated Vital Signs BP (!) 155/118 (BP Location: Left Arm)   Pulse 86   Temp 98.6 F (37 C)   Resp 18   LMP 07/17/2014 Comment: Pt stated that she is going through menopause and that she will go for 6-8 months without a period and then have it twice a month.   SpO2 98%   Physical Exam Vitals and nursing note reviewed.  Constitutional:      General: She is not in acute distress.    Appearance: She is well-developed. She is not diaphoretic.  HENT:     Head: Normocephalic and atraumatic.  Eyes:     Pupils: Pupils are equal, round, and reactive to light.  Cardiovascular:     Rate and Rhythm: Normal rate and regular rhythm.     Heart sounds: No murmur heard.   No friction rub. No gallop.  Pulmonary:     Effort: Pulmonary effort is normal.     Breath sounds: No wheezing or rales.  Abdominal:     General: There is no distension.     Palpations: Abdomen is soft.     Tenderness: There is no abdominal tenderness.  Musculoskeletal:        General: No tenderness.       Arms:     Cervical back: Normal range of motion and neck supple.     Comments: Approximately silver dollar sized area of erythema and tenderness on the right shoulder.  Skin:    General: Skin is warm and dry.  Neurological:     Mental Status: She is alert and oriented to person, place, and time.  Psychiatric:        Behavior: Behavior normal.     ED Results / Procedures / Treatments   Labs (all labs ordered are listed, but only abnormal results are displayed) Labs Reviewed - No data to display  EKG None  Radiology No results found.  Procedures Procedures   Medications Ordered in ED Medications - No data to display  ED Course  I have reviewed the triage vital signs and the nursing notes.  Pertinent labs & imaging results that were available during my care of the patient were reviewed by me and considered in my medical decision making (see chart for details).    MDM Rules/Calculators/A&P                           55 yo F with a chief complaints of a reaction to her vaccination.  She had the influenza and pneumococcal vaccination placed in the same deltoid due to medication patch on the other arm.  She had significant erythema and swelling to the arm as well as flulike symptoms for about 3 or 4 days.  This has almost completely resolved now.  The erythema and swelling is also improved significantly.  At this point says things are improving I do not feel that antibiotics would be helpful.  We will have her follow-up with her physician in the office.  5:15 PM:  I have discussed the diagnosis/risks/treatment options with the patient and believe the pt to be eligible for discharge home to follow-up with PCP. We also discussed returning to the ED immediately if new or worsening sx occur. We discussed the sx which are most concerning (e.g., sudden worsening pain, fever, inability to tolerate by mouth) that necessitate immediate return. Medications administered to the patient during their visit and any new prescriptions provided to the patient are listed below.  Medications given during this visit Medications - No data to display   The patient appears reasonably screen and/or stabilized for discharge and I doubt any other medical condition or other [facility] requiring further screening, evaluation, or treatment in the ED at this time  prior to discharge.   Final Clinical Impression(s) / ED Diagnoses Final diagnoses:  Vaccination reaction, initial encounter    Rx / DC Orders ED Discharge Orders     None        Deno Etienne, DO 06/16/21 1715

## 2021-06-16 NOTE — ED Triage Notes (Addendum)
Patient got a flu shot last Friday, had flu like symptoms and tenderness, warmth and redness at site of injection.  Symptoms have been improving last few days but concerned about injection site.  No previous history of allergic reaction.

## 2021-06-16 NOTE — Discharge Instructions (Signed)
Please return for rapid spreading redness or if you develop a fever.  Please follow-up with your doctor in the office.  Please discuss with them about this reaction and see their thoughts about your vaccinations next year.

## 2021-06-19 ENCOUNTER — Other Ambulatory Visit: Payer: Self-pay

## 2021-06-20 ENCOUNTER — Ambulatory Visit: Payer: Medicare Other | Admitting: Internal Medicine

## 2021-06-20 ENCOUNTER — Other Ambulatory Visit (INDEPENDENT_AMBULATORY_CARE_PROVIDER_SITE_OTHER): Payer: Medicare Other

## 2021-06-20 DIAGNOSIS — R7989 Other specified abnormal findings of blood chemistry: Secondary | ICD-10-CM

## 2021-06-20 DIAGNOSIS — K921 Melena: Secondary | ICD-10-CM

## 2021-06-20 DIAGNOSIS — E559 Vitamin D deficiency, unspecified: Secondary | ICD-10-CM

## 2021-06-20 LAB — CBC WITH DIFFERENTIAL/PLATELET
Basophils Absolute: 0 10*3/uL (ref 0.0–0.1)
Basophils Relative: 0.4 % (ref 0.0–3.0)
Eosinophils Absolute: 0.2 10*3/uL (ref 0.0–0.7)
Eosinophils Relative: 1.4 % (ref 0.0–5.0)
HCT: 40.7 % (ref 36.0–46.0)
Hemoglobin: 13.8 g/dL (ref 12.0–15.0)
Lymphocytes Relative: 32.9 % (ref 12.0–46.0)
Lymphs Abs: 3.8 10*3/uL (ref 0.7–4.0)
MCHC: 34 g/dL (ref 30.0–36.0)
MCV: 93.4 fl (ref 78.0–100.0)
Monocytes Absolute: 0.5 10*3/uL (ref 0.1–1.0)
Monocytes Relative: 4.5 % (ref 3.0–12.0)
Neutro Abs: 7.1 10*3/uL (ref 1.4–7.7)
Neutrophils Relative %: 60.8 % (ref 43.0–77.0)
Platelets: 521 10*3/uL — ABNORMAL HIGH (ref 150.0–400.0)
RBC: 4.36 Mil/uL (ref 3.87–5.11)
RDW: 13.2 % (ref 11.5–15.5)
WBC: 11.6 10*3/uL — ABNORMAL HIGH (ref 4.0–10.5)

## 2021-06-20 LAB — VITAMIN D 25 HYDROXY (VIT D DEFICIENCY, FRACTURES): VITD: 28.26 ng/mL — ABNORMAL LOW (ref 30.00–100.00)

## 2021-06-21 ENCOUNTER — Ambulatory Visit (INDEPENDENT_AMBULATORY_CARE_PROVIDER_SITE_OTHER): Payer: Medicare Other | Admitting: Family Medicine

## 2021-06-21 ENCOUNTER — Encounter: Payer: Self-pay | Admitting: Family Medicine

## 2021-06-21 ENCOUNTER — Other Ambulatory Visit: Payer: Self-pay

## 2021-06-21 VITALS — BP 120/78 | HR 61 | Temp 98.6°F | Wt 224.2 lb

## 2021-06-21 DIAGNOSIS — T50A95D Adverse effect of other bacterial vaccines, subsequent encounter: Secondary | ICD-10-CM

## 2021-06-21 MED ORDER — PREDNISONE 20 MG PO TABS: ORAL_TABLET | ORAL | 0 refills | Status: DC

## 2021-06-21 NOTE — Progress Notes (Signed)
Established Patient Office Visit  Subjective:  Patient ID: [name], female    DOB: [birth year]  Age: 55 y.o. MRN: [id number]  CC:  Chief Complaint  Patient presents with   Allergic Reaction    HPI [name] presents for recent right arm swelling.  She was here on the ninth and had both influenza and Pneumovax in her right deltoid region.  She has never had reaction to flu vaccines previously.  She had some swelling and pain and redness afterwards and also relates intermittent fever for 5 to 6 days after her vaccine.  She went to the ER and it was felt that she was having local vaccine reaction and less likely cellulitis.  Her fevers have resolved at this time.  Erythema is improving.  Still has some soreness and induration at the site of the vaccine.  She received both vaccines in the right deltoid given the fact that she has chronic pain medication patch on the left deltoid.  She has been icing this regularly.  She is basically asking for something to help try to reduce the inflammation.  Still has considerable soreness.  Past Medical History:  Diagnosis Date   Arthritis    Chronic back pain    Degenerative spinal arthritis    Dercum's disease    Fibromyalgia    Migraines     Past Surgical History:  Procedure Laterality Date   APPENDECTOMY     BACK SURGERY     CHOLECYSTECTOMY     SPINAL FUSION     TONSILLECTOMY      Family History  Problem Relation Age of Onset   Aneurysm Mother    Lung cancer Father    Heart disease Brother        CABG at 34, fatal MI at 1   Heart disease Brother     Social History   Socioeconomic History   Marital status: Divorced    Spouse name: Not on file   Number of children: 3   Years of education: Not on file   Highest education level: Not on file  Occupational History   Not on file  Tobacco Use   Smoking status: Some Days    Packs/day: 0.50    Years: 23.00    Pack years: 11.50    Types: Cigarettes     Start date: 15   Smokeless tobacco: Never  Vaping Use   Vaping Use: Never used  Substance and Sexual Activity   Alcohol use: No   Drug use: No   Sexual activity: Yes  Other Topics Concern   Not on file  Social History Narrative   Not on file   Social Determinants of Health   Financial Resource Strain: Low Risk    Difficulty of Paying Living Expenses: Not hard at all  Food Insecurity: No Food Insecurity   Worried About Charity fundraiser in the Last Year: Never true   Ran Out of Food in the Last Year: Never true  Transportation Needs: No Transportation Needs   Lack of Transportation (Medical): No   Lack of Transportation (Non-Medical): No  Physical Activity: Inactive   Days of Exercise per Week: 0 days   Minutes of Exercise per Session: 0 min  Stress: Stress Concern Present   Feeling of Stress : To some extent  Social Connections: Moderately Isolated   Frequency of Communication with Friends and Family: More than three times a week   Frequency of Social Gatherings with Friends and  Family: More than three times a week   Attends Religious Services: More than 4 times per year   Active Member of Clubs or Organizations: No   Attends Archivist Meetings: Never   Marital Status: Divorced  Human resources officer Violence: Not At Risk   Fear of Current or Ex-Partner: No   Emotionally Abused: No   Physically Abused: No   Sexually Abused: No    Outpatient Medications Prior to Visit  Medication Sig Dispense Refill   Artificial Saliva (BIOTENE DRY MOUTH) GUM Use as directed in the mouth or throat.     buprenorphine (BUTRANS) 10 MCG/HR PTWK 1 patch once a week.     EUCALYPTUS OIL EX Apply topically.     fluticasone (FLONASE) 50 MCG/ACT nasal spray Place 2 sprays into both nostrils daily. 16 g 5   gabapentin (NEURONTIN) 600 MG tablet TAKE TWO TABLETS BY MOUTH 3 TIMES A DAY     Melatonin 3 MG TABS Take by mouth.     meloxicam (MOBIC) 15 MG tablet Take 1 tablet by mouth daily.      traZODone (DESYREL) 100 MG tablet Take 100-200 mg by mouth at bedtime.     Vitamin D, Ergocalciferol, (DRISDOL) 1.25 MG (50000 UNIT) CAPS capsule Take 1 capsule (50,000 Units total) by mouth every 7 (seven) days. 5 capsule 3   No facility-administered medications prior to visit.    Allergies  Allergen Reactions   Quetiapine Fumarate Nausea Only    Nausea, headache and lethargy   Seroquel [Quetiapine Fumarate] Nausea Only    Nausea, headache and lethargy    ROS Review of Systems  Constitutional:  Negative for chills, fever and unexpected weight change.  Respiratory:  Negative for shortness of breath.      Objective:    Physical Exam Vitals reviewed.  Cardiovascular:     Rate and Rhythm: Normal rate.  Skin:    Comments: Right deltoid region reveals approximately 3 x 3 cm area of erythema.  There is surrounding underlying area of induration about 6 x 7 cm.  No fluctuance.  This entire area is tender to palpation.  Minimal if any warmth.  No pustules.  No vesicles.  Neurological:     Mental Status: She is alert.    BP 120/78 (BP Location: Left Arm, Patient Position: Sitting, Cuff Size: Normal)   Pulse 61   Temp 98.6 F (37 C) (Oral)   Wt 224 lb 3.2 oz (101.7 kg)   LMP 07/17/2014 Comment: Pt stated that she is going through menopause and that she will go for 6-8 months without a period and then have it twice a month.   SpO2 97%   BMI 39.09 kg/m  Wt Readings from Last 3 Encounters:  06/21/21 224 lb 3.2 oz (101.7 kg)  06/09/21 228 lb (103.4 kg)  06/07/21 227 lb 3.2 oz (103.1 kg)     Health Maintenance Due  Topic Date Due   HIV Screening  Never done   Hepatitis C Screening  Never done   TETANUS/TDAP  Never done   Zoster Vaccines- Shingrix (1 of 2) Never done   COLONOSCOPY (Pts 45-73yrs Insurance coverage will need to be confirmed)  Never done    There are no preventive care reminders to display for this patient.  No results found for: TSH Lab Results   Component Value Date   WBC 11.6 (H) 06/20/2021   HGB 13.8 06/20/2021   HCT 40.7 06/20/2021   MCV 93.4 06/20/2021   PLT 521.0 (  H) 06/20/2021   Lab Results  Component Value Date   NA 138 02/06/2021   K 4.0 02/06/2021   CO2 23 02/06/2021   GLUCOSE 114 (H) 02/06/2021   BUN 23 (H) 02/06/2021   CREATININE 0.78 02/06/2021   AST 36 12/06/2020   ALT 36 (H) 12/06/2020   CALCIUM 9.3 02/06/2021   ANIONGAP 8 02/06/2021   GFR 103.34 12/06/2020   Lab Results  Component Value Date   CHOL 181 12/06/2020   Lab Results  Component Value Date   HDL 42.20 12/06/2020   Lab Results  Component Value Date   LDLCALC 103 (H) 12/06/2020   Lab Results  Component Value Date   TRIG 183.0 (H) 12/06/2020   Lab Results  Component Value Date   CHOLHDL 4 12/06/2020   Lab Results  Component Value Date   HGBA1C 6.3 05/23/2021      Assessment & Plan:   Problem List Items Addressed This Visit   None Visit Diagnoses     Local reaction to pneumococcal vaccine, subsequent encounter    -  Primary     Patient presents with local vaccine reaction.  Suspect most likely related pneumococcal vaccine.  We explained this will take time to go down fully incised and I recommend she continue icing for soreness and swelling.  We did agree to brief course of prednisone but reviewed potential side effects with this.  Follow-up immediately for any recurrent fever, recurrent erythema, or other concerns  Meds ordered this encounter  Medications   predniSONE (DELTASONE) 20 MG tablet    Sig: Take two tablets by mouth once daily for 5 days.    Dispense:  10 tablet    Refill:  0    Follow-up: No follow-ups on file.    Carolann Littler, MD

## 2021-06-21 NOTE — Patient Instructions (Signed)
Continue with icing 20 minutes 3-4 times daily  Follow up with Dr [name] for persistent swelling, recurrent fever or any recurrent redness.

## 2021-06-27 ENCOUNTER — Encounter: Payer: Self-pay | Admitting: Family Medicine

## 2021-06-27 ENCOUNTER — Other Ambulatory Visit: Payer: Self-pay

## 2021-06-27 ENCOUNTER — Ambulatory Visit (INDEPENDENT_AMBULATORY_CARE_PROVIDER_SITE_OTHER): Payer: Medicare Other | Admitting: Family Medicine

## 2021-06-27 ENCOUNTER — Ambulatory Visit: Payer: Medicare Other | Admitting: Family Medicine

## 2021-06-27 VITALS — BP 110/80 | HR 91 | Temp 98.5°F | Wt 223.0 lb

## 2021-06-27 DIAGNOSIS — L03113 Cellulitis of right upper limb: Secondary | ICD-10-CM

## 2021-06-27 MED ORDER — DOXYCYCLINE HYCLATE 100 MG PO TABS: 100.0000 mg | ORAL_TABLET | Freq: Two times a day (BID) | ORAL | 0 refills | Status: DC

## 2021-06-27 NOTE — Progress Notes (Signed)
   Subjective:    Patient ID: [name], female    DOB: 01/08/1966, 55 y.o.   MRN: [id number]  HPI Here to follow up on a severe reaction to getting a flu vaccine and her first pneumonia vaccine in the same arm (the right) on 06-09-21. Shortly afterwards she had flu-like symptoms of body aches and low grad fevers for 4-5 days. These then went away. However the upper right arm has remained swollen, warm ,and very painful since then. She went to the ER on 06-16-21 and they decided not to give her anything. She then saw Dr. Elease Hashimoto on 06-21-21 and he gave her 5 days or Prednisone 20 mg daily. This helped a tiny bit, but the symptoms still remain. She cannot raise the arm above her head due to the pain. She had a CBC drawn on 06-20-21 showing a WBC count of 11.6 and a normal differential. She continues to apply ice every day.   Review of Systems  Constitutional: Negative.   Respiratory: Negative.    Cardiovascular: Negative.   Skin:  Positive for wound.      Objective:   Physical Exam Constitutional:      General: She is not in acute distress.    Appearance: Normal appearance.  Cardiovascular:     Rate and Rhythm: Normal rate and regular rhythm.     Pulses: Normal pulses.     Heart sounds: Normal heart sounds.  Pulmonary:     Effort: Pulmonary effort is normal.     Breath sounds: Normal breath sounds.  Musculoskeletal:     Comments: The right upper arm is swollen, red, warm  and very tender. There is an area of induration about 3 cm by 3 cm in this area   Neurological:     Mental Status: She is alert.          Assessment & Plan:  There seems to be a cellultiis or even a possible abscess in the right upper arm. We will treat with 10 days of Doxycycline. Recheck as needed.  Alysia Penna, MD

## 2021-07-12 ENCOUNTER — Encounter: Payer: Self-pay | Admitting: Family Medicine

## 2021-07-12 ENCOUNTER — Ambulatory Visit (INDEPENDENT_AMBULATORY_CARE_PROVIDER_SITE_OTHER): Payer: Medicare Other | Admitting: Family Medicine

## 2021-07-12 ENCOUNTER — Other Ambulatory Visit: Payer: Self-pay

## 2021-07-12 VITALS — BP 128/80 | HR 100 | Resp 16 | Ht 63.5 in | Wt 224.0 lb

## 2021-07-12 DIAGNOSIS — M722 Plantar fascial fibromatosis: Secondary | ICD-10-CM

## 2021-07-12 DIAGNOSIS — R7303 Prediabetes: Secondary | ICD-10-CM | POA: Diagnosis not present

## 2021-07-12 DIAGNOSIS — R2231 Localized swelling, mass and lump, right upper limb: Secondary | ICD-10-CM

## 2021-07-12 DIAGNOSIS — B351 Tinea unguium: Secondary | ICD-10-CM | POA: Diagnosis not present

## 2021-07-12 NOTE — Patient Instructions (Signed)
A few things to remember from today's visit:   Plantar fasciitis - Plan: Ambulatory referral to Podiatry  Arm mass, right - Plan: Ambulatory referral to General Surgery  Prediabetes - Plan: Hemoglobin A1c  If you need refills please call your pharmacy. Do not use My Chart to request refills or for acute issues that need immediate attention.   Labs in 08/2021. Plantar Fasciitis Plantar fasciitis is a painful foot condition that affects the heel. It occurs when the band of tissue that connects the toes to the heel bone (plantar fascia) becomes irritated. This can happen as the result of exercising too much or doing other repetitive activities (overuse injury). Plantar fasciitis can cause mild irritation to severe pain that makes it difficult to walk or move. The pain is usually worse in the morning after sleeping, or after sitting or lying down for a period of time. Pain may also be worse after long periods of walking or standing. What are the causes? This condition may be caused by: Standing for long periods of time. Wearing shoes that do not have good arch support. Doing activities that put stress on joints (high-impact activities). This includes ballet and exercise that makes your heart beat faster (aerobic exercise), such as running. Being overweight. An abnormal way of walking (gait). Tight muscles in the back of your lower leg (calf). High arches in your feet or flat feet. Starting a new athletic activity. What are the signs or symptoms? The main symptom of this condition is heel pain. Pain may get worse after the following: Taking the first steps after a time of rest, especially in the morning after awakening, or after you have been sitting or lying down for a while. Long periods of standing still. Pain may decrease after 30-45 minutes of activity, such as gentle walking. How is this diagnosed? This condition may be diagnosed based on your medical history, a physical exam, and  your symptoms. Your health care provider will check for: A tender area on the bottom of your foot. A high arch in your foot or flat feet. Pain when you move your foot. Difficulty moving your foot. You may have imaging tests to confirm the diagnosis, such as: X-rays. Ultrasound. MRI. How is this treated? Treatment for plantar fasciitis depends on how severe your condition is. Treatment may include: Rest, ice, pressure (compression), and raising (elevating) the affected foot. This is called RICE therapy. Your health care provider may recommend RICE therapy along with over-the-counter pain medicines to manage your pain. Exercises to stretch your calves and your plantar fascia. A splint that holds your foot in a stretched, upward position while you sleep (night splint). Physical therapy to relieve symptoms and prevent problems in the future. Injections of steroid medicine (cortisone) to relieve pain and inflammation. Stimulating your plantar fascia with electrical impulses (extracorporeal shock wave therapy). This is usually the last treatment option before surgery. Surgery, if other treatments have not worked after 12 months. Follow these instructions at home: Managing pain, stiffness, and swelling  If directed, put ice on the painful area. To do this: Put ice in a plastic bag, or use a frozen bottle of water. Place a towel between your skin and the bag or bottle. Roll the bottom of your foot over the bag or bottle. Do this for 20 minutes, 2-3 times a day. Wear athletic shoes that have air-sole or gel-sole cushions, or try soft shoe inserts that are designed for plantar fasciitis. Elevate your foot above the level of  your heart while you are sitting or lying down. Activity Avoid activities that cause pain. Ask your health care provider what activities are safe for you. Do physical therapy exercises and stretches as told by your health care provider. Try activities and forms of exercise  that are easier on your joints (low impact). Examples include swimming, water aerobics, and biking. General instructions Take over-the-counter and prescription medicines only as told by your health care provider. Wear a night splint while sleeping, if told by your health care provider. Loosen the splint if your toes tingle, become numb, or turn cold and blue. Maintain a healthy weight, or work with your health care provider to lose weight as needed. Keep all follow-up visits. This is important. Contact a health care provider if you have: Symptoms that do not go away with home treatment. Pain that gets worse. Pain that affects your ability to move or do daily activities. Summary Plantar fasciitis is a painful foot condition that affects the heel. It occurs when the band of tissue that connects the toes to the heel bone (plantar fascia) becomes irritated. Heel pain is the main symptom of this condition. It may get worse after exercising too much or standing still for a long time. Treatment varies, but it usually starts with rest, ice, pressure (compression), and raising (elevating) the affected foot. This is called RICE therapy. Over-the-counter medicines can also be used to manage pain. This information is not intended to replace advice given to you by your health care provider. Make sure you discuss any questions you have with your health care provider. Document Revised: 01/04/2020 Document Reviewed: 01/04/2020 Elsevier Patient Education  2022 [place].  Please be sure medication list is accurate. If a new problem present, please set up appointment sooner than planned today.

## 2021-07-12 NOTE — Progress Notes (Addendum)
HPI: [name] is a 55 y.o. female, who is here today for follow up on recent visit.  She was last seen on 06/21/21 and 06/27/21 for right arm pain thought to be a reaction to vaccination received on 06/09/21. Right arm painful "lump" and local erythema noted 5-6 days after flu and pneumovax vaccination. She was treated with Prednisone and Doxycycline.   It has improved, pain is "not as bad." Pain is about 3-4/10, intermittent, exacerbated by palpation and movement. Still feels "hot" and has ecchymosis around deltoid area. No hx of trauma. Negative for associated fever,chills,sore throat,numbness,or tingling. + Fatigue.  Right heel pain, intermittent for about 4 weeks. Exacerbated by walking in the morning when she gets up and after prolonged rest. No hx of trauma. She has not noted edema or erythema. Dull pain, "not bad" but she wants to have it treated by podiatrist before pain gets worse. She has not tried OTC treatments.  She also had a toenail fungal infection right great toe for about a year.  She has some concerns about labs done last visit and requesting HgA1C rechecked. Last CBC with mild abnormalities and 25 OH was 28.2. It was recommended starting vit D3 5000 U x 3 months then 2000 U daily.  It was recommended to repeat labs in a couple months.   Lab Results  Component Value Date   WBC 11.6 (H) 06/20/2021   HGB 13.8 06/20/2021   HCT 40.7 06/20/2021   MCV 93.4 06/20/2021   PLT 521.0 (H) 06/20/2021   Prediabetes: She is trying to follow a healthful diet. Due to chronic pain pain it is challenging to engage in regular physical activity. Negative for abdominal pain, nausea,vomiting, polydipsia,polyuria, or polyphagia.  Lab Results  Component Value Date   HGBA1C 6.3 05/23/2021   Review of Systems  Constitutional:  Positive for fatigue. Negative for appetite change and unexpected weight change.  Respiratory:  Negative for cough and wheezing.    Cardiovascular:  Negative for chest pain and palpitations.  Musculoskeletal:  Positive for arthralgias and myalgias.  Hematological:  Negative for adenopathy. Does not bruise/bleed easily.  Rest of ROS, see pertinent positives sand negatives in HPI  Current Outpatient Medications on File Prior to Visit  Medication Sig Dispense Refill   Artificial Saliva (BIOTENE DRY MOUTH) GUM Use as directed in the mouth or throat.     buprenorphine (BUTRANS) 15 MCG/HR 1 patch once a week.     EUCALYPTUS OIL EX Apply topically.     gabapentin (NEURONTIN) 600 MG tablet TAKE TWO TABLETS BY MOUTH 3 TIMES A DAY     meloxicam (MOBIC) 15 MG tablet Take 1 tablet by mouth daily.     traZODone (DESYREL) 100 MG tablet Take 100-200 mg by mouth at bedtime.     No current facility-administered medications on file prior to visit.   Past Medical History:  Diagnosis Date   Arthritis    Chronic back pain    Degenerative spinal arthritis    Dercum's disease    Fibromyalgia    Migraines    Allergies  Allergen Reactions   Quetiapine Fumarate Nausea Only    Nausea, headache and lethargy   Seroquel [Quetiapine Fumarate] Nausea Only    Nausea, headache and lethargy    Social History   Socioeconomic History   Marital status: Divorced    Spouse name: Not on file   Number of children: 3   Years of education: Not on file   Highest education level:  Not on file  Occupational History   Not on file  Tobacco Use   Smoking status: Some Days    Packs/day: 0.50    Years: 23.00    Pack years: 11.50    Types: Cigarettes    Start date: 92   Smokeless tobacco: Never  Vaping Use   Vaping Use: Never used  Substance and Sexual Activity   Alcohol use: No   Drug use: No   Sexual activity: Yes  Other Topics Concern   Not on file  Social History Narrative   Not on file   Social Determinants of Health   Financial Resource Strain: Low Risk    Difficulty of Paying Living Expenses: Not hard at all  Food  Insecurity: No Food Insecurity   Worried About Charity fundraiser in the Last Year: Never true   Ran Out of Food in the Last Year: Never true  Transportation Needs: No Transportation Needs   Lack of Transportation (Medical): No   Lack of Transportation (Non-Medical): No  Physical Activity: Inactive   Days of Exercise per Week: 0 days   Minutes of Exercise per Session: 0 min  Stress: Stress Concern Present   Feeling of Stress : To some extent  Social Connections: Moderately Isolated   Frequency of Communication with Friends and Family: More than three times a week   Frequency of Social Gatherings with Friends and Family: More than three times a week   Attends Religious Services: More than 4 times per year   Active Member of Clubs or Organizations: No   Attends Archivist Meetings: Never   Marital Status: Divorced    Vitals:   07/12/21 1126  BP: 128/80  Pulse: 100  Resp: 16  SpO2: 98%   Body mass index is 39.06 kg/m.  Physical Exam Vitals and nursing note reviewed.  Constitutional:      General: She is not in acute distress.    Appearance: She is well-developed.  HENT:     Head: Normocephalic and atraumatic.  Eyes:     Conjunctiva/sclera: Conjunctivae normal.  Cardiovascular:     Rate and Rhythm: Normal rate and regular rhythm.  Pulmonary:     Effort: Pulmonary effort is normal. No respiratory distress.     Breath sounds: Normal breath sounds.  Musculoskeletal:       Arms:     Comments: Right foot:Tenderness upon palpation of heel mainly at the medial insertion of plantar fascia into calcaneous. Also mild discomfort with palpation along planta fascia towards forefoot. small bunion  Dorsal flexion of first MTP does not elicits pain.  No edema or erythema appreciated on area.   Lymphadenopathy:     Cervical: No cervical adenopathy.  Skin:    General: Skin is warm.     Findings: No rash.     Comments: Nail polish on, right great toenail thick.No  periungual erythema or edema appreciated.  Neurological:     Mental Status: She is alert and oriented to person, place, and time.  Psychiatric:        Speech: Speech normal.     Comments: Well groomed, good eye contact.   ASSESSMENT AND PLAN:  Ms. [name] was seen today for recent visit follow-up.  Orders Placed This Encounter  Procedures   Hemoglobin A1c   Ambulatory referral to Podiatry   Ambulatory referral to General Surgery   Arm mass, right Noted after local reaction to vaccination. Treated with prednisone and oral abx. On examination  today no signs of active infection. We discussed possible causes. ? Lipoma,seroma. We discussed options: Aspiration with needle here in the office or referral to surgeon, she prefers the latter one, referral placed.  Plantar fasciitis We discussed Dx, prognosis,and treatment options. Stretching,night splint,and shoe inserts will help. Comfortable shoes. If not better, she may need local steroid injection.  Prediabetes HgA1C added to future labs (CBC and 25 OH vit D) to be done in 08/2021. Continue a healthy life style for diabetes prevention.  Onychomycosis of great toe We discussed prognosis. Topical vick vapor rub may help. She would like to discuss treatment options with podiatrist.  Return if symptoms worsen or fail to improve, for Keep next appt.  Ever Halberg G. [name], MD  [facility]. [place] office.

## 2021-07-14 ENCOUNTER — Encounter: Payer: Self-pay | Admitting: Gastroenterology

## 2021-07-14 ENCOUNTER — Other Ambulatory Visit: Payer: Self-pay | Admitting: Gastroenterology

## 2021-07-14 ENCOUNTER — Ambulatory Visit (INDEPENDENT_AMBULATORY_CARE_PROVIDER_SITE_OTHER): Payer: Medicare Other | Admitting: Gastroenterology

## 2021-07-14 VITALS — BP 120/64 | HR 90 | Ht 63.5 in | Wt 226.0 lb

## 2021-07-14 DIAGNOSIS — K921 Melena: Secondary | ICD-10-CM

## 2021-07-14 DIAGNOSIS — K5730 Diverticulosis of large intestine without perforation or abscess without bleeding: Secondary | ICD-10-CM | POA: Diagnosis not present

## 2021-07-14 DIAGNOSIS — Z8719 Personal history of other diseases of the digestive system: Secondary | ICD-10-CM | POA: Diagnosis not present

## 2021-07-14 MED ORDER — SUTAB 1479-225-188 MG PO TABS: 24.0000 | ORAL_TABLET | ORAL | 0 refills | Status: DC

## 2021-07-14 NOTE — Patient Instructions (Signed)
Start Miralax 3 days prior to colonoscopy- Dissolve 1 capful (17g) in at least 8 ounces of water or juice for 3 3 days prior to colonoscopy.   We have sent the following medications to your pharmacy for you to pick up at your convenience: Sutab  You have been scheduled for a colonoscopy. Please follow written instructions given to you at your visit today.  Please pick up your prep supplies at the pharmacy within the next 1-3 days. If you use inhalers (even only as needed), please bring them with you on the day of your procedure.  If you are age 53 or younger, your body mass index should be between 19-25. Your Body mass index is 39.41 kg/m. If this is out of the aformentioned range listed, please consider follow up with your Primary Care Provider.   __________________________________________________________  The  GI providers would like to encourage you to use [facility] to communicate with providers for non-urgent requests or questions.  Due to long hold times on the telephone, sending your provider a message by [facility] may be a faster and more efficient way to get a response.  Please allow 48 business hours for a response.  Please remember that this is for non-urgent requests.    Thank you for choosing me and [facility].  Dr. Rush Landmark

## 2021-07-15 ENCOUNTER — Encounter: Payer: Self-pay | Admitting: Gastroenterology

## 2021-07-15 ENCOUNTER — Encounter: Payer: Self-pay | Admitting: Family Medicine

## 2021-07-15 DIAGNOSIS — K921 Melena: Secondary | ICD-10-CM | POA: Insufficient documentation

## 2021-07-15 DIAGNOSIS — Z8719 Personal history of other diseases of the digestive system: Secondary | ICD-10-CM | POA: Insufficient documentation

## 2021-07-15 NOTE — Progress Notes (Signed)
[facility] VISIT   Primary Care Provider [name], Betty G, MD [place] [address] [phone]  Referring Provider [name], Betty G, MD [address] [place],  [place] [address] [phone]  Patient Profile: [name] is a 55 y.o. female with a pmh significant for fibromyalgia, arthritis, chronic back pain, migraines, diverticulosis with prior diverticulitis, status postcholecystectomy.  The patient presents to the [facility] for an evaluation and management of problem(s) noted below:  Problem List 1. Blood in stool   2. Hematochezia   3. Diverticulosis of colon   4. History of diverticulitis     History of Present Illness This is the patient's first visit to the outpatient [facility] clinic.  She was previously evaluated at [facility] years ago and has had a colonoscopy within the last 5 to 6 years.  She reports this was normal without any findings.  However she describes a prior history of diverticulosis as well as diverticulitis in the last 10 to 15 years (likely in 2010 with prior imaging noted below).  She underwent a cholecystectomy in the early 2000's with improvement in her discomfort at that time.  Patient feels heartburn symptoms a few times a month but will use Alka-Seltzer and have good improvement.  She denies any dysphagia or odynophagia symptoms.  She has a bowel movement between 2 and 3 times per day but the caliber of the stool can be hard at times and small in quantity and sometimes pellet-like.  She does use Metamucil at times.  Patient has not noted over the course the last few months that she has had bright red blood per rectum and pink tinge every now and then of the toilet basin.  She is noted brown stool with blood admixed within it as well.  Is for this reason that the patient has been concerned about the potential cause of this.  She does not know that she has hemorrhoids was been  told in the past that she has hemorrhoids.  There is no GI malignancies in her family.  No new medications have been initiated.  She has never had an upper endoscopy.  GI Review of Systems Positive as above Negative for dysphagia, odynophagia, nausea, vomiting, pain, alteration of bowel habits  Review of Systems General: Denies fevers/chills/weight loss unintentionally HEENT: Denies oral lesions Cardiovascular: Denies chest pain Pulmonary: Denies shortness of breath Gastroenterological: See HPI Genitourinary: Denies darkened urine Hematological: Denies easy bruising/bleeding Endocrine: Denies temperature intolerance Dermatological: Denies jaundice Psychological: Mood is stable   Medications Current Outpatient Medications  Medication Sig Dispense Refill   Artificial Saliva (BIOTENE DRY MOUTH) GUM Use as directed in the mouth or throat.     buprenorphine (BUTRANS) 15 MCG/HR 1 patch once a week.     diphenhydrAMINE (BENADRYL) 25 mg capsule Take 25 mg by mouth every 6 (six) hours as needed.     EUCALYPTUS OIL EX Apply topically.     gabapentin (NEURONTIN) 600 MG tablet TAKE TWO TABLETS BY MOUTH 3 TIMES A DAY     meloxicam (MOBIC) 15 MG tablet Take 1 tablet by mouth daily.     Sodium Sulfate-Mag Sulfate-KCl (SUTAB) [phone] MG TABS Take 24 tablets by mouth as directed. 24 tablet 0   traZODone (DESYREL) 100 MG tablet Take 100-200 mg by mouth at bedtime.     No current facility-administered medications for this visit.    Allergies Allergies  Allergen Reactions   Quetiapine Fumarate Nausea Only  Nausea, headache and lethargy   Seroquel [Quetiapine Fumarate] Nausea Only    Nausea, headache and lethargy    Histories Past Medical History:  Diagnosis Date   Arthritis    Chronic back pain    Degenerative spinal arthritis    Dercum's disease    Fibromyalgia    Migraines    Past Surgical History:  Procedure Laterality Date   APPENDECTOMY     BACK SURGERY      CHOLECYSTECTOMY     SPINAL FUSION     TONSILLECTOMY     Social History   Socioeconomic History   Marital status: Divorced    Spouse name: Not on file   Number of children: 3   Years of education: Not on file   Highest education level: Not on file  Occupational History   Not on file  Tobacco Use   Smoking status: Some Days    Packs/day: 0.50    Years: 23.00    Pack years: 11.50    Types: Cigarettes    Start date: 50   Smokeless tobacco: Never  Vaping Use   Vaping Use: Never used  Substance and Sexual Activity   Alcohol use: No   Drug use: No   Sexual activity: Yes  Other Topics Concern   Not on file  Social History Narrative   Not on file   Social Determinants of Health   Financial Resource Strain: Low Risk    Difficulty of Paying Living Expenses: Not hard at all  Food Insecurity: No Food Insecurity   Worried About Charity fundraiser in the Last Year: Never true   Ran Out of Food in the Last Year: Never true  Transportation Needs: No Transportation Needs   Lack of Transportation (Medical): No   Lack of Transportation (Non-Medical): No  Physical Activity: Inactive   Days of Exercise per Week: 0 days   Minutes of Exercise per Session: 0 min  Stress: Stress Concern Present   Feeling of Stress : To some extent  Social Connections: Moderately Isolated   Frequency of Communication with Friends and Family: More than three times a week   Frequency of Social Gatherings with Friends and Family: More than three times a week   Attends Religious Services: More than 4 times per year   Active Member of [employer] or Organizations: No   Attends Music therapist: Never   Marital Status: Divorced  Human resources officer Violence: Not At Risk   Fear of Current or Ex-Partner: No   Emotionally Abused: No   Physically Abused: No   Sexually Abused: No   Family History  Problem Relation Age of Onset   Aneurysm Mother    Lung cancer Father    Heart disease Brother         CABG at 104, fatal MI at 33   Heart disease Brother    Colon cancer Neg Hx    Esophageal cancer Neg Hx    Inflammatory bowel disease Neg Hx    Liver disease Neg Hx    Pancreatic cancer Neg Hx    Rectal cancer Neg Hx    Stomach cancer Neg Hx    I have reviewed her medical, social, and family history in detail and updated the electronic medical record as necessary.    PHYSICAL EXAMINATION  BP 120/64   Pulse 90   Ht 5' 3.5" (1.613 m)   Wt 226 lb (102.5 kg)   LMP 07/17/2014 Comment: Pt stated that she is going  through menopause and that she will go for 6-8 months without a period and then have it twice a month.   SpO2 98%   BMI 39.41 kg/m  Wt Readings from Last 3 Encounters:  07/14/21 226 lb (102.5 kg)  07/12/21 224 lb (101.6 kg)  06/27/21 223 lb (101.2 kg)  GEN: NAD, appears stated age, doesn't appear chronically ill PSYCH: Cooperative, without pressured speech EYE: Conjunctivae pink, sclerae anicteric ENT: MMM CV: Nontachycardic RESP: No audible wheezing GI: NABS, soft, NT/ND, without rebound GU: DRE deferred today since she agrees to move forward with colonoscopy soon MSK/EXT: No lower extremity edema SKIN: No jaundice NEURO:  Alert & Oriented x 3, no focal deficits   REVIEW OF DATA  I reviewed the following data at the time of this encounter:  GI Procedures and Studies  Reports a normal colonoscopy at age 75 at [facility] we will work on trying to obtain those results  Laboratory Studies  Reviewed those in epic and care everywhere  Imaging Studies  2010 CT abdomen pelvis IMPRESSION:  Normal appearance.  Previous cholecystectomy.  IMPRESSION:  Sigmoid diverticulosis, probably with low-level diverticulitis.  No  advanced disease or abscess.    ASSESSMENT  Ms. Cimino is a 55 y.o. female with a pmh significant for fibromyalgia, arthritis, chronic back pain, migraines, diverticulosis with prior diverticulitis, status postcholecystectomy.  The patient is seen today  for evaluation and management of:  1. Blood in stool   2. Hematochezia   3. Diverticulosis of colon   4. History of diverticulitis    The patient is hemodynamically and clinically stable at this time.  She has experienced new onset bright red blood per rectum and blood admixed in the stool.  Etiology may only end up being hemorrhoidal in nature but in the setting of her last colonoscopy being greater than 5 years ago and these new symptoms it is worthwhile to consider repeat endoscopic evaluation to define and ensure that nothing else is being missed.  The risks and benefits of endoscopic evaluation were discussed with the patient; these include but are not limited to the risk of perforation, infection, bleeding, missed lesions, lack of diagnosis, severe illness requiring hospitalization, as well as anesthesia and sedation related illnesses.  The patient and/or family is agreeable to proceed.  All patient questions were answered to the best of my ability, and the patient agrees to the aforementioned plan of action with follow-up as indicated.   PLAN  Proceed with scheduling colonoscopy Offered consideration of suppository therapy if hemorrhoidal bleeding is occurring but deferred by patient right now since she will have procedure soon MiraLAX daily for at least 3 days before procedure and then initiate bowel prep   Orders Placed This Encounter  Procedures   Ambulatory referral to Gastroenterology    New Prescriptions   SODIUM SULFATE-MAG SULFATE-KCL (SUTAB) [phone] MG TABS    Take 24 tablets by mouth as directed.   Modified Medications   No medications on file    Planned Follow Up No follow-ups on file.   Total Time in Face-to-Face and in Coordination of Care for patient including independent/personal interpretation/review of prior testing, medical history, examination, medication adjustment, communicating results with the patient directly, and documentation within the EHR is 35  minutes.   Justice Britain, MD [facility] Office # [phone]

## 2021-07-18 ENCOUNTER — Telehealth: Payer: Self-pay | Admitting: Gastroenterology

## 2021-07-18 ENCOUNTER — Other Ambulatory Visit: Payer: Self-pay | Admitting: Gastroenterology

## 2021-07-18 ENCOUNTER — Other Ambulatory Visit: Payer: Self-pay

## 2021-07-18 MED ORDER — SUTAB 1479-225-188 MG PO TABS: 24.0000 | ORAL_TABLET | ORAL | 0 refills | Status: DC

## 2021-07-18 NOTE — Telephone Encounter (Signed)
Patient called said her insurance will not cover the Sutab medication seeking advise.

## 2021-07-18 NOTE — Telephone Encounter (Signed)
Called and spoke with patient advised her that I would try to obtain prior authorization for sutab once request has been faxed over. In the meantime I will also fax rx over to [facility] to see if they can assist with this request. Pt understands and will keep me informed of which option will work best for her after everything has been processed.

## 2021-07-18 NOTE — Telephone Encounter (Signed)
Patient called stating she spoke with her insurance company and they are faxing a preauthorization form that needs to be filled out and sent back within 72 hours for the Sutabs.  Just wanted you to be aware that it was coming so you could be looking for it.  Thank you.

## 2021-07-21 ENCOUNTER — Ambulatory Visit (INDEPENDENT_AMBULATORY_CARE_PROVIDER_SITE_OTHER): Payer: Medicare Other

## 2021-07-21 ENCOUNTER — Other Ambulatory Visit: Payer: Self-pay

## 2021-07-21 ENCOUNTER — Ambulatory Visit (INDEPENDENT_AMBULATORY_CARE_PROVIDER_SITE_OTHER): Payer: Medicare Other | Admitting: Podiatry

## 2021-07-21 DIAGNOSIS — M722 Plantar fascial fibromatosis: Secondary | ICD-10-CM

## 2021-07-21 NOTE — Progress Notes (Signed)
d 

## 2021-07-24 ENCOUNTER — Encounter: Payer: Self-pay | Admitting: Gastroenterology

## 2021-07-24 NOTE — Progress Notes (Signed)
Review of outside records to be scanned into the chart  2017 colonoscopy at [facility]  Medium mouth diverticula were found in the sigmoid colon, with a moderate degree of muscular thickening and stiffening of that segment of the colon with associated restricted mobility. No other significant abnormalities were identified and a careful examination of the remainder of the colon. Retroflexion was not performed in the rectum due to small rectal ampulla but careful antegrade viewing disclose no abnormalities. The terminal ileum appeared normal.  Justice Britain, MD [facility] Advanced [facility]oscopy Office # [phone]

## 2021-07-26 ENCOUNTER — Encounter: Payer: Self-pay | Admitting: Podiatry

## 2021-07-26 NOTE — Progress Notes (Signed)
Subjective:  Patient ID: [name], female    DOB: May 27, 1966,  MRN: [id number]  No chief complaint on file.   55 y.o. female presents with the above complaint.  Patient presents with complaint of right heel pain that is going on for quite some time.  Pain is worse in the morning in the center of the heel.  She states hurts with ambulation.  She is a diabetic with last A1c of 6.3.  She has not seen anyone else prior to see me.  She wanted to discuss treatment options for this.  She is experienced post attic dyskinesia type of symptoms.  She wanted get it evaluated.  Her pain scale is 8 out of 10 hurts with ambulation   Review of Systems: Negative except as noted in the HPI. Denies N/V/F/Ch.  Past Medical History:  Diagnosis Date   Arthritis    Chronic back pain    Degenerative spinal arthritis    Dercum's disease    Fibromyalgia    Migraines     Current Outpatient Medications:    Artificial Saliva (BIOTENE DRY MOUTH) GUM, Use as directed in the mouth or throat., Disp: , Rfl:    buprenorphine (BUTRANS) 15 MCG/HR, 1 patch once a week., Disp: , Rfl:    diphenhydrAMINE (BENADRYL) 25 mg capsule, Take 25 mg by mouth every 6 (six) hours as needed., Disp: , Rfl:    EUCALYPTUS OIL EX, Apply topically., Disp: , Rfl:    gabapentin (NEURONTIN) 600 MG tablet, TAKE TWO TABLETS BY MOUTH 3 TIMES A DAY, Disp: , Rfl:    meloxicam (MOBIC) 15 MG tablet, Take 1 tablet by mouth daily., Disp: , Rfl:    Sodium Sulfate-Mag Sulfate-KCl (SUTAB) [phone] MG TABS, Take 24 tablets by mouth as directed., Disp: 24 tablet, Rfl: 0   traZODone (DESYREL) 100 MG tablet, Take 100-200 mg by mouth at bedtime., Disp: , Rfl:   Social History   Tobacco Use  Smoking Status Some Days   Packs/day: 0.50   Years: 23.00   Pack years: 11.50   Types: Cigarettes   Start date: 1981  Smokeless Tobacco Never    Allergies  Allergen Reactions   Quetiapine Fumarate Nausea Only    Nausea, headache and lethargy    Seroquel [Quetiapine Fumarate] Nausea Only    Nausea, headache and lethargy   Objective:  There were no vitals filed for this visit. There is no height or weight on file to calculate BMI. Constitutional Well developed. Well nourished.  Vascular Dorsalis pedis pulses palpable bilaterally. Posterior tibial pulses palpable bilaterally. Capillary refill normal to all digits.  No cyanosis or clubbing noted. Pedal hair growth normal.  Neurologic Normal speech. Oriented to person, place, and time. Epicritic sensation to light touch grossly present bilaterally.  Dermatologic Nails well groomed and normal in appearance. No open wounds. No skin lesions.  Orthopedic: Normal joint ROM without pain or crepitus bilaterally. No visible deformities. Tender to palpation at the calcaneal tuber right. No pain with calcaneal squeeze right. Ankle ROM diminished range of motion right. Silfverskiold Test: positive right.   Radiographs: Taken and reviewed. No acute fractures or dislocations. No evidence of stress fracture.  Plantar heel spur present. Posterior heel spur present.   Assessment:   1. Plantar fasciitis, right    Plan:  Patient was evaluated and treated and all questions answered.  Plantar Fasciitis, right - XR reviewed as above.  - Educated on icing and stretching. Instructions given.  - Injection delivered to the plantar  fascia as below. - DME: Plantar Fascial Brace - Pharmacologic management: None  Procedure: Injection Tendon/Ligament Location: Right plantar fascia at the glabrous junction; medial approach. Skin Prep: alcohol Injectate: 0.5 cc 0.5% marcaine plain, 0.5 cc of 1% Lidocaine, 0.5 cc kenalog 10. Disposition: Patient tolerated procedure well. Injection site dressed with a band-aid.  No follow-ups on file.

## 2021-07-27 ENCOUNTER — Encounter (HOSPITAL_BASED_OUTPATIENT_CLINIC_OR_DEPARTMENT_OTHER): Payer: Medicare (Managed Care) | Admitting: Pulmonary Disease

## 2021-07-27 ENCOUNTER — Telehealth: Payer: Self-pay | Admitting: Family Medicine

## 2021-07-27 DIAGNOSIS — F419 Anxiety disorder, unspecified: Secondary | ICD-10-CM

## 2021-07-27 NOTE — Telephone Encounter (Signed)
Patient called to get referral to [facility] for Dr. Lanell Persons for psychology and Dr.Megan for psychiatry. She has been going for years but her insurance has changed policy and she now requires a referral on file to continue having them pay.    Good callback number for Patient is [phone]  Fax number for [facility] is [phone]  Their phone number is [phone]    Please advise

## 2021-07-28 NOTE — Telephone Encounter (Signed)
Referrals placed 

## 2021-07-31 ENCOUNTER — Telehealth: Payer: Self-pay | Admitting: Podiatry

## 2021-07-31 NOTE — Telephone Encounter (Signed)
Pt called office to request athletes foot cream discussed at last appt.  Thanks

## 2021-08-01 ENCOUNTER — Other Ambulatory Visit: Payer: Self-pay | Admitting: General Surgery

## 2021-08-01 DIAGNOSIS — M7989 Other specified soft tissue disorders: Secondary | ICD-10-CM

## 2021-08-01 MED ORDER — CLOTRIMAZOLE-BETAMETHASONE 1-0.05 % EX CREA: 1.0000 "application " | TOPICAL_CREAM | Freq: Two times a day (BID) | CUTANEOUS | 0 refills | Status: DC

## 2021-08-01 NOTE — Addendum Note (Signed)
Addended by: Boneta Lucks on: 08/01/2021 08:08 AM   Modules accepted: Orders

## 2021-08-02 ENCOUNTER — Encounter: Payer: Medicare Other | Admitting: Gastroenterology

## 2021-08-14 ENCOUNTER — Telehealth: Payer: Self-pay | Admitting: *Deleted

## 2021-08-14 NOTE — Telephone Encounter (Signed)
Patient is calling because she is having a possible reaction to athlete's medication, has used 1 1/2 tubes , red, inflamed,feels as though feet are on fire, itching ,developed water blisters,worse than before.She was fine for the first few days and thought it would get worse before getting better but now it has spread in between the toes and entire foot.Please advise.

## 2021-08-15 NOTE — Telephone Encounter (Signed)
Returned the call to patient, no answer, left vmessage per Dr Serita Grit recommendations.

## 2021-08-16 ENCOUNTER — Other Ambulatory Visit: Payer: Medicare Other

## 2021-08-18 ENCOUNTER — Ambulatory Visit: Payer: Medicare Other | Admitting: Podiatry

## 2021-08-21 ENCOUNTER — Ambulatory Visit
Admission: RE | Admit: 2021-08-21 | Discharge: 2021-08-21 | Disposition: A | Discharge status: Home / Self Care | Payer: Medicare Other | Source: Ambulatory Visit | Attending: General Surgery | Admitting: General Surgery

## 2021-08-21 DIAGNOSIS — M7989 Other specified soft tissue disorders: Secondary | ICD-10-CM

## 2021-08-29 ENCOUNTER — Telehealth: Payer: Self-pay | Admitting: Gastroenterology

## 2021-08-29 NOTE — Telephone Encounter (Signed)
Hey Dr. Rush Landmark,   Patient called in to cancel procedure for 12/1 due to have to have surgery on her right arm. She did reschedule for 1/4.  Rovonda,   Patient would like a call back to go over prep instructions ahead of new appt time.   Thank you

## 2021-08-29 NOTE — Telephone Encounter (Signed)
Thank you for the update. We will not assess late cancellation fee for this rescheduling. However if she reschedules again she will need to be assessed the late cancellation fee. Thanks. GM

## 2021-08-31 ENCOUNTER — Telehealth: Payer: Self-pay | Admitting: Family Medicine

## 2021-08-31 ENCOUNTER — Encounter: Payer: Medicare Other | Admitting: Gastroenterology

## 2021-08-31 ENCOUNTER — Other Ambulatory Visit: Payer: Self-pay | Admitting: General Surgery

## 2021-08-31 DIAGNOSIS — M7989 Other specified soft tissue disorders: Secondary | ICD-10-CM

## 2021-08-31 NOTE — Telephone Encounter (Signed)
Patient is requesting a phone call back from Judson Roch. Patient wouldn't go into detail about what but she stated that she called last week and still haven't received a message back. There isn't any message regarding that call on file.  Please advise.

## 2021-09-01 ENCOUNTER — Ambulatory Visit: Payer: Medicare Other | Admitting: Podiatry

## 2021-09-01 NOTE — Telephone Encounter (Signed)
I called and spoke with patient. She wanted to know about if she would need another pneumococcal vaccine given her reaction with the Pneumovax 23. I advised the provider would review when it's time for her next one to see if another vaccine would work better for her. She is waiting on an MRI to be done before the surgeon can do the surgery on her arm. She inquired about her covid booster, I advised her to wait until after she has her surgery to have that vaccine done. Pt also inquired about an obgyn referral, gave her the information on [facility] and [facility]. She will let me know if she needs a referral. Pt also inquired about moving her lab appointment to late January. Appointment rescheduled for 1/20 at 2pm.

## 2021-09-01 NOTE — Telephone Encounter (Signed)
Attempted to contact patient, unable to leave a message

## 2021-09-06 NOTE — Telephone Encounter (Signed)
New instructions sent to patient via mychart and sent in the mail.

## 2021-09-13 ENCOUNTER — Ambulatory Visit (INDEPENDENT_AMBULATORY_CARE_PROVIDER_SITE_OTHER): Payer: Medicare Other | Admitting: Podiatry

## 2021-09-13 ENCOUNTER — Other Ambulatory Visit: Payer: Self-pay

## 2021-09-13 DIAGNOSIS — L603 Nail dystrophy: Secondary | ICD-10-CM

## 2021-09-15 ENCOUNTER — Encounter: Payer: Self-pay | Admitting: Podiatry

## 2021-09-15 NOTE — Progress Notes (Signed)
Subjective:  Patient ID: [name], female    DOB: 11-24-1965,  MRN: [id number]  Chief Complaint  Patient presents with   Plantar Fasciitis    Plantar fasciitis is doing better still having issues with the itching of her foot     55 y.o. female presents with the above complaint.  Patient presents with right thickened elongated dystrophic mycotic toenail x1 to the right hallux.  She states is painful to touch.  She would like to have removed.  She is ready to remove.  She denies any other acute complaints.  Is painful to walk on painful to her pain scale 7 out of 10 would like to have removed no clinical signs of infection.   Review of Systems: Negative except as noted in the HPI. Denies N/V/F/Ch.  Past Medical History:  Diagnosis Date   Arthritis    Chronic back pain    Degenerative spinal arthritis    Dercum's disease    Fibromyalgia    Migraines     Current Outpatient Medications:    Artificial Saliva (BIOTENE DRY MOUTH) GUM, Use as directed in the mouth or throat., Disp: , Rfl:    buprenorphine (BUTRANS) 15 MCG/HR, 1 patch once a week., Disp: , Rfl:    clotrimazole-betamethasone (LOTRISONE) cream, Apply 1 application topically 2 (two) times daily., Disp: 30 g, Rfl: 0   diphenhydrAMINE (BENADRYL) 25 mg capsule, Take 25 mg by mouth every 6 (six) hours as needed., Disp: , Rfl:    EUCALYPTUS OIL EX, Apply topically., Disp: , Rfl:    gabapentin (NEURONTIN) 600 MG tablet, TAKE TWO TABLETS BY MOUTH 3 TIMES A DAY, Disp: , Rfl:    meloxicam (MOBIC) 15 MG tablet, Take 1 tablet by mouth daily., Disp: , Rfl:    Sodium Sulfate-Mag Sulfate-KCl (SUTAB) [phone] MG TABS, Take 24 tablets by mouth as directed., Disp: 24 tablet, Rfl: 0   traZODone (DESYREL) 100 MG tablet, Take 100-200 mg by mouth at bedtime., Disp: , Rfl:   Social History   Tobacco Use  Smoking Status Some Days   Packs/day: 0.50   Years: 23.00   Pack years: 11.50   Types: Cigarettes   Start date: 24   Smokeless Tobacco Never    Allergies  Allergen Reactions   Pneumovax 23 [Pneumococcal Vac Polyvalent] Other (See Comments)    Arm mass   Quetiapine Fumarate Nausea Only    Nausea, headache and lethargy   Seroquel [Quetiapine Fumarate] Nausea Only    Nausea, headache and lethargy   Objective:  There were no vitals filed for this visit. There is no height or weight on file to calculate BMI. Constitutional Well developed. Well nourished.  Vascular Dorsalis pedis pulses palpable bilaterally. Posterior tibial pulses palpable bilaterally. Capillary refill normal to all digits.  No cyanosis or clubbing noted. Pedal hair growth normal.  Neurologic Normal speech. Oriented to person, place, and time. Epicritic sensation to light touch grossly present bilaterally.  Dermatologic Pain on palpation of the entire/total nail on 1st digit of the right No other open wounds. No skin lesions.  Orthopedic: Normal joint ROM without pain or crepitus bilaterally. No visible deformities. No bony tenderness.   Radiographs: None Assessment:   1. Nail dystrophy    Plan:  Patient was evaluated and treated and all questions answered.  Nail contusion/dystrophy hallux, right -Patient elects to proceed with minor surgery to remove entire toenail today. Consent reviewed and signed by patient. -Entire/total nail excised. See procedure note. -Educated on post-procedure care including  soaking. Written instructions provided and reviewed. -Patient to follow up in 2 weeks for nail check.  Procedure: Excision of entire/total nail  Location: Right 1st toe digit Anesthesia: Lidocaine 1% plain; 1.5 mL and Marcaine 0.5% plain; 1.5 mL, digital block. Skin Prep: Betadine. Dressing: Silvadene; telfa; dry, sterile, compression dressing. Technique: Following skin prep, the toe was exsanguinated and a tourniquet was secured at the base of the toe. The affected nail border was freed and excised. The tourniquet was  then removed and sterile dressing applied. Disposition: Patient tolerated procedure well. Patient to return in 2 weeks for follow-up.   No follow-ups on file.

## 2021-09-16 ENCOUNTER — Ambulatory Visit
Admission: RE | Admit: 2021-09-16 | Discharge: 2021-09-16 | Disposition: A | Discharge status: Home / Self Care | Payer: Medicare Other | Source: Ambulatory Visit | Attending: General Surgery | Admitting: General Surgery

## 2021-09-16 ENCOUNTER — Other Ambulatory Visit: Payer: Self-pay

## 2021-09-16 DIAGNOSIS — M7989 Other specified soft tissue disorders: Secondary | ICD-10-CM

## 2021-09-16 MED ORDER — GADOBENATE DIMEGLUMINE 529 MG/ML IV SOLN
20.0000 mL | Freq: Once | INTRAVENOUS | Status: AC | PRN
  Administered 2021-09-16: 20 mL via INTRAVENOUS

## 2021-09-18 ENCOUNTER — Ambulatory Visit: Payer: Self-pay | Admitting: General Surgery

## 2021-09-18 ENCOUNTER — Telehealth: Payer: Self-pay | Admitting: *Deleted

## 2021-09-18 NOTE — Telephone Encounter (Signed)
Patient is calling to ask the physician when she should stop applying neosporin and bandage. She also said that a athletes cream was supposed to be sent to pharmacy on file, they have not received as of yet.Please advise.

## 2021-09-19 ENCOUNTER — Other Ambulatory Visit: Payer: Medicare Other

## 2021-09-19 ENCOUNTER — Other Ambulatory Visit: Payer: Self-pay | Admitting: Podiatry

## 2021-09-19 MED ORDER — CLOTRIMAZOLE-BETAMETHASONE 1-0.05 % EX CREA: 1.0000 "application " | TOPICAL_CREAM | Freq: Two times a day (BID) | CUTANEOUS | 0 refills | Status: DC

## 2021-09-20 NOTE — Patient Instructions (Signed)
DUE TO COVID-19 ONLY ONE VISITOR IS ALLOWED TO COME WITH YOU AND STAY IN THE WAITING ROOM ONLY DURING PRE OP AND PROCEDURE.   **NO VISITORS ARE ALLOWED IN THE SHORT STAY AREA OR RECOVERY ROOM!!**   You are not required to quarantine, however you are required to wear a well-fitted mask when you are out and around people not in your household.  Hand Hygiene often Do NOT share personal items Notify your provider if you are in close contact with someone who has COVID or you develop fever 100.4 or greater, new onset of sneezing, cough, sore throat, shortness of breath or body aches.        Your procedure is scheduled on: Tuesday, 10-10-21   Report to [facility] Main  Entrance     Report to admitting at 5:15 AM   Call this number if you have problems the morning of surgery [phone]   Do not eat food :After Midnight.   May have liquids until 4:30 AM day of surgery  CLEAR LIQUID DIET  Foods Allowed                                                                     Foods Excluded  Water, Black Coffee (no milk/no creamer) and tea, regular and decaf                              liquids that you cannot  Plain Jell-O in any flavor  (No red)                         see through such as: Fruit ices (not with fruit pulp)                                 milk, soups, orange juice  Iced Popsicles (No red)                                    All solid food                             Apple juices Sports drinks like Gatorade (No red) Lightly seasoned clear broth or consume(fat free) Sugar     Complete one Ensure drink the morning of surgery at 4:30 AM the day of surgery.     The day of surgery:  Drink ONE (1) Pre-Surgery Clear Ensure the morning of surgery. Drink in one sitting. Do not sip.  This drink was given to you during your hospital  pre-op appointment visit. Nothing else to drink after completing the Pre-Surgery Clear Ensure          If you have questions, please  contact your surgeons office.     Oral Hygiene is also important to reduce your risk of infection.  Remember - BRUSH YOUR TEETH THE MORNING OF SURGERY WITH YOUR REGULAR TOOTHPASTE   Do NOT smoke after Midnight   Take these medicines the morning of surgery with A SIP OF WATER:  Tylenol, Gabapentin   Stop all vitamins and herbal supplements a week before surgery             You may not have any metal on your body including hair pins, jewelry, and body piercing             Do not wear make-up, lotions, powders, perfumes or deodorant  Do not wear nail polish including gel and S&S, artificial/acrylic nails, or any other type of covering on natural nails including finger and toenails. If you have artificial nails, gel coating, etc. that needs to be removed by a nail salon please have this removed prior to surgery or surgery may need to be canceled/ delayed if the surgeon/ anesthesia feels like they are unable to be safely monitored.   Do not shave  48 hours prior to surgery.               Do not bring valuables to the hospital. [place].   Contacts, dentures or bridgework may not be worn into surgery.     Patients discharged the day of surgery will not be allowed to drive home.  Special Instructions: Bring a copy of your healthcare power of attorney and living will documents the day of surgery if you haven't scanned them in before.  Please read over the following fact sheets you were given: IF YOU HAVE QUESTIONS ABOUT YOUR PRE OP INSTRUCTIONS PLEASE CALL [phone]   [facility] [place] [facility] - Preparing for Surgery Before surgery, you can play an important role.  Because skin is not sterile, your skin needs to be as free of germs as possible.  You can reduce the number of germs on your skin by washing with CHG (chlorahexidine gluconate) soap before surgery.  CHG is an antiseptic cleaner which kills germs and bonds with the skin to  continue killing germs even after washing. Please DO NOT use if you have an allergy to CHG or antibacterial soaps.  If your skin becomes reddened/irritated stop using the CHG and inform your nurse when you arrive at Short Stay. Do not shave (including legs and underarms) for at least 48 hours prior to the first CHG shower.  You may shave your face/neck.  Please follow these instructions carefully:  1.  Shower with CHG Soap the night before surgery and the  morning of surgery.  2.  If you choose to wash your hair, wash your hair first as usual with your normal  shampoo.  3.  After you shampoo, rinse your hair and body thoroughly to remove the shampoo.                             4.  Use CHG as you would any other liquid soap.  You can apply chg directly to the skin and wash.  Gently with a scrungie or clean washcloth.  5.  Apply the CHG Soap to your body ONLY FROM THE NECK DOWN.   Do   not use on face/ open                           Wound or open sores. Avoid contact with eyes, ears mouth and  genitals (private parts).                       Wash face,  Genitals (private parts) with your normal soap.             6.  Wash thoroughly, paying special attention to the area where your    surgery  will be performed.  7.  Thoroughly rinse your body with warm water from the neck down.  8.  DO NOT shower/wash with your normal soap after using and rinsing off the CHG Soap.                9.  Pat yourself dry with a clean towel.            10.  Wear clean pajamas.            11.  Place clean sheets on your bed the night of your first shower and do not  sleep with pets. Day of Surgery : Do not apply any lotions/deodorants the morning of surgery.  Please wear clean clothes to the hospital/surgery center.  FAILURE TO FOLLOW THESE INSTRUCTIONS MAY RESULT IN THE CANCELLATION OF YOUR SURGERY  PATIENT SIGNATURE_________________________________  NURSE  SIGNATURE__________________________________  ________________________________________________________________________

## 2021-09-26 ENCOUNTER — Other Ambulatory Visit: Payer: Self-pay

## 2021-09-26 ENCOUNTER — Encounter (HOSPITAL_COMMUNITY)
Admission: RE | Admit: 2021-09-26 | Discharge: 2021-09-26 | Disposition: A | Discharge status: Home / Self Care | Payer: Medicare Other | Source: Ambulatory Visit | Attending: General Surgery | Admitting: General Surgery

## 2021-09-26 ENCOUNTER — Encounter (HOSPITAL_COMMUNITY): Payer: Self-pay

## 2021-09-26 VITALS — BP 136/95 | HR 72 | Temp 98.7°F | Resp 16 | Ht 63.5 in | Wt 217.0 lb

## 2021-09-26 DIAGNOSIS — Z01812 Encounter for preprocedural laboratory examination: Secondary | ICD-10-CM | POA: Diagnosis not present

## 2021-09-26 DIAGNOSIS — R7989 Other specified abnormal findings of blood chemistry: Secondary | ICD-10-CM | POA: Insufficient documentation

## 2021-09-26 DIAGNOSIS — Z01818 Encounter for other preprocedural examination: Secondary | ICD-10-CM

## 2021-09-26 LAB — CBC
HCT: 44.7 % (ref 36.0–46.0)
Hemoglobin: 14.6 g/dL (ref 12.0–15.0)
MCH: 31.6 pg (ref 26.0–34.0)
MCHC: 32.7 g/dL (ref 30.0–36.0)
MCV: 96.8 fL (ref 80.0–100.0)
Platelets: 314 10*3/uL (ref 150–400)
RBC: 4.62 MIL/uL (ref 3.87–5.11)
RDW: 13 % (ref 11.5–15.5)
WBC: 9.2 10*3/uL (ref 4.0–10.5)
nRBC: 0 % (ref 0.0–0.2)

## 2021-09-26 NOTE — Progress Notes (Addendum)
PCP - Betty [name] MD Cardiologist - Dr. Valente David 06-11-2021  PPM/ICD -  Device Orders -  Rep Notified -   Chest x-ray - 02-06-21 epic EKG - 02-07-21 epic Stress Test - 07-20-19  ECHO - 07-10-19 Cardiac Cath -   Sleep Study -  CPAP -   Fasting Blood Sugar -  Checks Blood Sugar _____ times a day  Blood Thinner Instructions: Aspirin Instructions:  ERAS Protcol - PRE-SURGERY Ensure or G2-   COVID TEST- N/A COVID vaccine - x2   Activity--Able to walk a flight of stairs without SOB Anesthesia review: [place] patch Pt. Intructed to hold 3 days or talk with prescribing provider  Patient denies shortness of breath, fever, cough and chest pain at PAT appointment   All instructions explained to the patient, with a verbal understanding of the material. Patient agrees to go over the instructions while at home for a better understanding. Patient also instructed to self quarantine after being tested for COVID-19. The opportunity to ask questions was provided.

## 2021-10-02 ENCOUNTER — Encounter (HOSPITAL_COMMUNITY): Payer: Self-pay | Admitting: Physician Assistant

## 2021-10-04 ENCOUNTER — Telehealth: Payer: Self-pay | Admitting: Family Medicine

## 2021-10-04 ENCOUNTER — Encounter: Payer: Medicare Other | Admitting: Gastroenterology

## 2021-10-04 DIAGNOSIS — R2231 Localized swelling, mass and lump, right upper limb: Secondary | ICD-10-CM

## 2021-10-04 NOTE — Addendum Note (Signed)
Addended by: Nathanial Millman E on: 10/04/2021 02:59 PM   Modules accepted: Orders

## 2021-10-04 NOTE — Addendum Note (Signed)
Addended by: Nathanial Millman E on: 10/04/2021 12:05 PM   Modules accepted: Orders

## 2021-10-04 NOTE — Telephone Encounter (Signed)
I called and spoke with patient. She had not been told everything that went into her surgery until she was at the hospital to do her pre-op. She had called to speak with the director at [facility] and had requested for a different surgeon. Pt's previous surgeon called her this morning and they canceled her surgery. We found another general surgery office within the [facility] system, I sent an urgent referral for pt as this has been ongoing since September and her arm is worsening.

## 2021-10-04 NOTE — Telephone Encounter (Signed)
Pt is calling and disregard finding her another surgeon she has another appt at [facility] with a different surgeon

## 2021-10-04 NOTE — Telephone Encounter (Signed)
Pt is calling and would like dr [name] to call her back concerning [facility] has cancelled her surgery and she would like to ask dr [name]  some questions

## 2021-10-04 NOTE — Telephone Encounter (Signed)
Pt is calling back an was suppose to have an appt on this Friday at [facility] and they call to cancel her appt  and she would like to go with [facility] and per pt she will not change her mind again

## 2021-10-10 ENCOUNTER — Ambulatory Visit (HOSPITAL_COMMUNITY)
Admission: RE | Admit: 2021-10-10 | Payer: Commercial Managed Care - HMO | Source: Home / Self Care | Admitting: General Surgery

## 2021-10-10 ENCOUNTER — Encounter (HOSPITAL_COMMUNITY): Admission: RE | Payer: Self-pay | Source: Home / Self Care

## 2021-10-10 SURGERY — EXCISION MASS
Anesthesia: General | Laterality: Right

## 2021-10-12 ENCOUNTER — Other Ambulatory Visit: Payer: Self-pay | Admitting: Podiatry

## 2021-10-20 ENCOUNTER — Other Ambulatory Visit: Payer: Medicare Other

## 2021-10-23 DIAGNOSIS — R2231 Localized swelling, mass and lump, right upper limb: Secondary | ICD-10-CM | POA: Diagnosis not present

## 2021-10-23 DIAGNOSIS — M25411 Effusion, right shoulder: Secondary | ICD-10-CM | POA: Diagnosis not present

## 2021-11-01 ENCOUNTER — Other Ambulatory Visit: Payer: Medicare Other

## 2021-11-08 ENCOUNTER — Other Ambulatory Visit (INDEPENDENT_AMBULATORY_CARE_PROVIDER_SITE_OTHER): Payer: Medicare Other

## 2021-11-08 DIAGNOSIS — R7989 Other specified abnormal findings of blood chemistry: Secondary | ICD-10-CM | POA: Diagnosis not present

## 2021-11-08 DIAGNOSIS — R7303 Prediabetes: Secondary | ICD-10-CM

## 2021-11-08 DIAGNOSIS — E559 Vitamin D deficiency, unspecified: Secondary | ICD-10-CM

## 2021-11-08 LAB — CBC WITH DIFFERENTIAL/PLATELET
Basophils Absolute: 0.1 10*3/uL (ref 0.0–0.1)
Basophils Relative: 0.5 % (ref 0.0–3.0)
Eosinophils Absolute: 0.2 10*3/uL (ref 0.0–0.7)
Eosinophils Relative: 1.6 % (ref 0.0–5.0)
HCT: 41.9 % (ref 36.0–46.0)
Hemoglobin: 13.9 g/dL (ref 12.0–15.0)
Lymphocytes Relative: 32.2 % (ref 12.0–46.0)
Lymphs Abs: 3.3 10*3/uL (ref 0.7–4.0)
MCHC: 33.3 g/dL (ref 30.0–36.0)
MCV: 93.6 fl (ref 78.0–100.0)
Monocytes Absolute: 0.5 10*3/uL (ref 0.1–1.0)
Monocytes Relative: 5.3 % (ref 3.0–12.0)
Neutro Abs: 6.1 10*3/uL (ref 1.4–7.7)
Neutrophils Relative %: 60.4 % (ref 43.0–77.0)
Platelets: 316 10*3/uL (ref 150.0–400.0)
RBC: 4.48 Mil/uL (ref 3.87–5.11)
RDW: 12.6 % (ref 11.5–15.5)
WBC: 10.1 10*3/uL (ref 4.0–10.5)

## 2021-11-08 LAB — VITAMIN D 25 HYDROXY (VIT D DEFICIENCY, FRACTURES): VITD: 23.26 ng/mL — ABNORMAL LOW (ref 30.00–100.00)

## 2021-11-08 LAB — HEMOGLOBIN A1C: Hgb A1c MFr Bld: 6.1 % (ref 4.6–6.5)

## 2021-11-15 ENCOUNTER — Other Ambulatory Visit: Payer: Self-pay | Admitting: Podiatry

## 2021-11-17 NOTE — Telephone Encounter (Signed)
Called patient and let her know medication was called in  

## 2021-11-17 NOTE — Telephone Encounter (Signed)
Medication needs refilled : clotrimazole-betamethasone (LOTRISONE) cream  [facility] on [address]    2nd request for medication refill.  Please advise

## 2021-12-08 ENCOUNTER — Other Ambulatory Visit: Payer: Self-pay

## 2021-12-19 ENCOUNTER — Other Ambulatory Visit: Payer: Self-pay | Admitting: Podiatry

## 2021-12-19 NOTE — Telephone Encounter (Signed)
Please advise 

## 2021-12-19 NOTE — Progress Notes (Deleted)
? ? ?ACUTE VISIT ?No chief complaint on file. ? ?HPI: ?[name] is a 56 y.o. female, who is here today complaining of *** ?HPI ? ?Review of Systems ?Rest see pertinent positives and negatives per HPI. ? ?Current Outpatient Medications on File Prior to Visit  ?Medication Sig Dispense Refill  ? acetaminophen (TYLENOL) 500 MG tablet Take 1,000 mg by mouth every 6 (six) hours as needed for mild pain or moderate pain.    ? Artificial Saliva (BIOTENE DRY MOUTH) GUM Use as directed 1 each in the mouth or throat daily as needed (Dry mouth).    ? buprenorphine (BUTRANS) 15 MCG/HR 1 patch once a week.    ? clotrimazole-betamethasone (LOTRISONE) cream APPLY TO AFFECTED AREA TWICE A DAY 30 g 0  ? diphenhydrAMINE (BENADRYL) 25 mg capsule Take 25 mg by mouth every 6 (six) hours as needed.    ? EUCALYPTUS OIL EX Apply 1 application topically once a week.    ? gabapentin (NEURONTIN) 600 MG tablet Take 1,200 mg by mouth 3 (three) times daily.    ? meloxicam (MOBIC) 15 MG tablet Take 15 mg by mouth daily.    ? OVER THE COUNTER MEDICATION Use as directed 1 application in the mouth or throat daily as needed (mouth sore).    ? Sodium Sulfate-Mag Sulfate-KCl (SUTAB) [phone] MG TABS Take 24 tablets by mouth as directed. 24 tablet 0  ? traZODone (DESYREL) 100 MG tablet Take 100 mg by mouth at bedtime.    ? ?No current facility-administered medications on file prior to visit.  ? ? ? ?Past Medical History:  ?Diagnosis Date  ? Arthritis   ? Chronic back pain   ? Degenerative spinal arthritis   ? Dercum's disease   ? Fibromyalgia   ? ?Allergies  ?Allergen Reactions  ? Pneumovax 23 [Pneumococcal Vac Polyvalent] Other (See Comments)  ?  Arm mass  ? Quetiapine Fumarate Nausea Only  ?  Nausea, headache and lethargy  ? Seroquel [Quetiapine Fumarate] Nausea Only  ?  Nausea, headache and lethargy  ? ? ?Social History  ? ?Socioeconomic History  ? Marital status: Divorced  ?  Spouse name: Not on file  ? Number of children: 3  ?  Years of education: Not on file  ? Highest education level: Not on file  ?Occupational History  ? Not on file  ?Tobacco Use  ? Smoking status: Every Day  ?  Packs/day: 0.50  ?  Years: 23.00  ?  Pack years: 11.50  ?  Types: Cigarettes  ?  Start date: 71  ? Smokeless tobacco: Never  ?Vaping Use  ? Vaping Use: Never used  ?Substance and Sexual Activity  ? Alcohol use: No  ? Drug use: No  ? Sexual activity: Not Currently  ?Other Topics Concern  ? Not on file  ?Social History Narrative  ? Not on file  ? ?Social Determinants of Health  ? ?Financial Resource Strain: Low Risk   ? Difficulty of Paying Living Expenses: Not hard at all  ?Food Insecurity: No Food Insecurity  ? Worried About Charity fundraiser in the Last Year: Never true  ? Ran Out of Food in the Last Year: Never true  ?Transportation Needs: No Transportation Needs  ? Lack of Transportation (Medical): No  ? Lack of Transportation (Non-Medical): No  ?Physical Activity: Inactive  ? Days of Exercise per Week: 0 days  ? Minutes of Exercise per Session: 0 min  ?Stress: Stress Concern Present  ? Feeling  of Stress : To some extent  ?Social Connections: Moderately Isolated  ? Frequency of Communication with Friends and Family: More than three times a week  ? Frequency of Social Gatherings with Friends and Family: More than three times a week  ? Attends Religious Services: More than 4 times per year  ? Active Member of Clubs or Organizations: No  ? Attends Archivist Meetings: Never  ? Marital Status: Divorced  ? ? ?There were no vitals filed for this visit. ?There is no height or weight on file to calculate BMI. ? ?Physical Exam ? ?ASSESSMENT AND PLAN: ? ?There are no diagnoses linked to this encounter. ? ? ?No follow-ups on file. ? ? ?Betty G. [name], MD ? ?[place]. ?[place] office. ? ?Discharge Instructions   ?None ?  ? ? ? ? ? ? ? ? ? ? ? ? ?

## 2021-12-20 ENCOUNTER — Ambulatory Visit: Payer: Medicare Other | Admitting: Family Medicine

## 2021-12-20 NOTE — Progress Notes (Deleted)
? ? ?ACUTE VISIT ?No chief complaint on file. ? ?HPI: ?[name] is a 56 y.o. female, who is here today complaining of *** ?HPI ? ?Review of Systems ?Rest see pertinent positives and negatives per HPI. ? ?Current Outpatient Medications on File Prior to Visit  ?Medication Sig Dispense Refill  ? acetaminophen (TYLENOL) 500 MG tablet Take 1,000 mg by mouth every 6 (six) hours as needed for mild pain or moderate pain.    ? Artificial Saliva (BIOTENE DRY MOUTH) GUM Use as directed 1 each in the mouth or throat daily as needed (Dry mouth).    ? buprenorphine (BUTRANS) 15 MCG/HR 1 patch once a week.    ? clotrimazole-betamethasone (LOTRISONE) cream APPLY TO AFFECTED AREA TWICE A DAY 30 g 0  ? diphenhydrAMINE (BENADRYL) 25 mg capsule Take 25 mg by mouth every 6 (six) hours as needed.    ? EUCALYPTUS OIL EX Apply 1 application topically once a week.    ? gabapentin (NEURONTIN) 600 MG tablet Take 1,200 mg by mouth 3 (three) times daily.    ? meloxicam (MOBIC) 15 MG tablet Take 15 mg by mouth daily.    ? OVER THE COUNTER MEDICATION Use as directed 1 application in the mouth or throat daily as needed (mouth sore).    ? Sodium Sulfate-Mag Sulfate-KCl (SUTAB) [phone] MG TABS Take 24 tablets by mouth as directed. 24 tablet 0  ? traZODone (DESYREL) 100 MG tablet Take 100 mg by mouth at bedtime.    ? ?No current facility-administered medications on file prior to visit.  ? ? ? ?Past Medical History:  ?Diagnosis Date  ? Arthritis   ? Chronic back pain   ? Degenerative spinal arthritis   ? Dercum's disease   ? Fibromyalgia   ? ?Allergies  ?Allergen Reactions  ? Pneumovax 23 [Pneumococcal Vac Polyvalent] Other (See Comments)  ?  Arm mass  ? Quetiapine Fumarate Nausea Only  ?  Nausea, headache and lethargy  ? Seroquel [Quetiapine Fumarate] Nausea Only  ?  Nausea, headache and lethargy  ? ? ?Social History  ? ?Socioeconomic History  ? Marital status: Divorced  ?  Spouse name: Not on file  ? Number of children: 3  ?  Years of education: Not on file  ? Highest education level: Not on file  ?Occupational History  ? Not on file  ?Tobacco Use  ? Smoking status: Every Day  ?  Packs/day: 0.50  ?  Years: 23.00  ?  Pack years: 11.50  ?  Types: Cigarettes  ?  Start date: 56  ? Smokeless tobacco: Never  ?Vaping Use  ? Vaping Use: Never used  ?Substance and Sexual Activity  ? Alcohol use: No  ? Drug use: No  ? Sexual activity: Not Currently  ?Other Topics Concern  ? Not on file  ?Social History Narrative  ? Not on file  ? ?Social Determinants of Health  ? ?Financial Resource Strain: Low Risk   ? Difficulty of Paying Living Expenses: Not hard at all  ?Food Insecurity: No Food Insecurity  ? Worried About Charity fundraiser in the Last Year: Never true  ? Ran Out of Food in the Last Year: Never true  ?Transportation Needs: No Transportation Needs  ? Lack of Transportation (Medical): No  ? Lack of Transportation (Non-Medical): No  ?Physical Activity: Inactive  ? Days of Exercise per Week: 0 days  ? Minutes of Exercise per Session: 0 min  ?Stress: Stress Concern Present  ? Feeling  of Stress : To some extent  ?Social Connections: Moderately Isolated  ? Frequency of Communication with Friends and Family: More than three times a week  ? Frequency of Social Gatherings with Friends and Family: More than three times a week  ? Attends Religious Services: More than 4 times per year  ? Active Member of Clubs or Organizations: No  ? Attends Archivist Meetings: Never  ? Marital Status: Divorced  ? ? ?There were no vitals filed for this visit. ?There is no height or weight on file to calculate BMI. ? ?Physical Exam ? ?ASSESSMENT AND PLAN: ? ?There are no diagnoses linked to this encounter. ? ? ?No follow-ups on file. ? ? ?Betty G. [name], MD ? ?[place]. ?[place] office. ? ?Discharge Instructions   ?None ?  ? ? ? ? ? ? ? ? ? ? ? ? ?

## 2021-12-21 DIAGNOSIS — Z79899 Other long term (current) drug therapy: Secondary | ICD-10-CM | POA: Diagnosis not present

## 2021-12-21 DIAGNOSIS — G894 Chronic pain syndrome: Secondary | ICD-10-CM | POA: Diagnosis not present

## 2021-12-21 DIAGNOSIS — G8929 Other chronic pain: Secondary | ICD-10-CM | POA: Diagnosis not present

## 2021-12-21 DIAGNOSIS — M25512 Pain in left shoulder: Secondary | ICD-10-CM | POA: Diagnosis not present

## 2021-12-21 DIAGNOSIS — M797 Fibromyalgia: Secondary | ICD-10-CM | POA: Diagnosis not present

## 2021-12-21 DIAGNOSIS — M4802 Spinal stenosis, cervical region: Secondary | ICD-10-CM | POA: Diagnosis not present

## 2021-12-21 DIAGNOSIS — E882 Lipomatosis, not elsewhere classified: Secondary | ICD-10-CM | POA: Diagnosis not present

## 2021-12-21 DIAGNOSIS — M542 Cervicalgia: Secondary | ICD-10-CM | POA: Diagnosis not present

## 2021-12-21 DIAGNOSIS — M50322 Other cervical disc degeneration at C5-C6 level: Secondary | ICD-10-CM | POA: Diagnosis not present

## 2021-12-22 ENCOUNTER — Ambulatory Visit: Payer: Medicare Other | Admitting: Family Medicine

## 2021-12-22 NOTE — Telephone Encounter (Signed)
Patient called to follow up on refill request from her pharmacy on 03/21. She wanted to follow up ? ?Please advise  ?

## 2021-12-25 ENCOUNTER — Ambulatory Visit: Payer: Medicare Other | Admitting: Family Medicine

## 2021-12-27 ENCOUNTER — Ambulatory Visit (AMBULATORY_SURGERY_CENTER): Payer: Medicare Other | Admitting: *Deleted

## 2021-12-27 ENCOUNTER — Other Ambulatory Visit: Payer: Self-pay

## 2021-12-27 VITALS — Ht 63.0 in | Wt 227.0 lb

## 2021-12-27 DIAGNOSIS — K921 Melena: Secondary | ICD-10-CM

## 2021-12-27 NOTE — Progress Notes (Signed)
No egg or soy allergy known to patient  ?No issues known to pt with past sedation with any surgeries or procedures ?Patient denies ever being told they had issues or difficulty with intubation  ?No FH of Malignant Hyperthermia ?Pt is not on diet pills ?Pt is not on  home 02  ?Pt is not on blood thinners  ?Pt denies issues with constipation  ?No A fib or A flutter ? ?Pt is fully vaccinated  for Covid   ? ?Due to the COVID-19 pandemic we are asking patients to follow certain guidelines in PV and the [place]   ?Pt aware of COVID protocols and [facility] guidelines  ? ?PV completed over the phone. Pt verified name, DOB, address and insurance during PV today.  ?Pt mailed instruction packet with copy of consent form to read and not return, and instructions.  ?Pt encouraged to call with questions or issues.  ?If pt has My chart, procedure instructions sent via My Chart   ? ?Pt.already had sutab at home from previous appointment. ?

## 2021-12-29 DIAGNOSIS — M47812 Spondylosis without myelopathy or radiculopathy, cervical region: Secondary | ICD-10-CM | POA: Diagnosis not present

## 2021-12-29 DIAGNOSIS — M4802 Spinal stenosis, cervical region: Secondary | ICD-10-CM | POA: Diagnosis not present

## 2022-01-04 DIAGNOSIS — M5412 Radiculopathy, cervical region: Secondary | ICD-10-CM | POA: Diagnosis not present

## 2022-01-04 DIAGNOSIS — E882 Lipomatosis, not elsewhere classified: Secondary | ICD-10-CM | POA: Diagnosis not present

## 2022-01-11 ENCOUNTER — Encounter: Payer: Self-pay | Admitting: Certified Registered Nurse Anesthetist

## 2022-01-16 ENCOUNTER — Encounter: Payer: Self-pay | Admitting: Certified Registered Nurse Anesthetist

## 2022-01-17 ENCOUNTER — Ambulatory Visit (AMBULATORY_SURGERY_CENTER): Payer: Medicare Other | Admitting: Gastroenterology

## 2022-01-17 ENCOUNTER — Encounter: Payer: Self-pay | Admitting: Gastroenterology

## 2022-01-17 VITALS — BP 120/73 | HR 72 | Temp 96.6°F | Resp 14 | Ht 63.0 in | Wt 227.0 lb

## 2022-01-17 DIAGNOSIS — D127 Benign neoplasm of rectosigmoid junction: Secondary | ICD-10-CM | POA: Diagnosis not present

## 2022-01-17 DIAGNOSIS — K648 Other hemorrhoids: Secondary | ICD-10-CM | POA: Diagnosis not present

## 2022-01-17 DIAGNOSIS — D123 Benign neoplasm of transverse colon: Secondary | ICD-10-CM | POA: Diagnosis not present

## 2022-01-17 DIAGNOSIS — D128 Benign neoplasm of rectum: Secondary | ICD-10-CM

## 2022-01-17 DIAGNOSIS — D124 Benign neoplasm of descending colon: Secondary | ICD-10-CM | POA: Diagnosis not present

## 2022-01-17 DIAGNOSIS — K5730 Diverticulosis of large intestine without perforation or abscess without bleeding: Secondary | ICD-10-CM

## 2022-01-17 DIAGNOSIS — D125 Benign neoplasm of sigmoid colon: Secondary | ICD-10-CM

## 2022-01-17 DIAGNOSIS — K921 Melena: Secondary | ICD-10-CM

## 2022-01-17 DIAGNOSIS — E7800 Pure hypercholesterolemia, unspecified: Secondary | ICD-10-CM | POA: Diagnosis not present

## 2022-01-17 MED ORDER — SODIUM CHLORIDE 0.9 % IV SOLN: 500.0000 mL | Freq: Once | INTRAVENOUS | Status: DC

## 2022-01-17 NOTE — Progress Notes (Signed)
Pt's states no medical or surgical changes since previsit or office visit. 

## 2022-01-17 NOTE — Patient Instructions (Signed)
Handout on polyps, diverticulosis and hemorrhoids provided  ? ?Await pathology results.  ? ?Continue current medications.  ? ?YOU HAD AN ENDOSCOPIC PROCEDURE TODAY AT [facility]:   Refer to the procedure report that was given to you for any specific questions about what was found during the examination.  If the procedure report does not answer your questions, please call your gastroenterologist to clarify.  If you requested that your care partner not be given the details of your procedure findings, then the procedure report has been included in a sealed envelope for you to review at your convenience later. ? ?YOU SHOULD EXPECT: Some feelings of bloating in the abdomen. Passage of more gas than usual.  Walking can help get rid of the air that was put into your GI tract during the procedure and reduce the bloating. If you had a lower endoscopy (such as a colonoscopy or flexible sigmoidoscopy) you may notice spotting of blood in your stool or on the toilet paper. If you underwent a bowel prep for your procedure, you may not have a normal bowel movement for a few days. ? ?Please Note:  You might notice some irritation and congestion in your nose or some drainage.  This is from the oxygen used during your procedure.  There is no need for concern and it should clear up in a day or so. ? ?SYMPTOMS TO REPORT IMMEDIATELY: ? ?Following lower endoscopy (colonoscopy or flexible sigmoidoscopy): ? Excessive amounts of blood in the stool ? Significant tenderness or worsening of abdominal pains ? Swelling of the abdomen that is new, acute ? Fever of 100?F or higher ? ?For urgent or emergent issues, a gastroenterologist can be reached at any hour by calling [phone]. ?Do not use MyChart messaging for urgent concerns.  ? ? ?DIET:  We do recommend a small meal at first, but then you may proceed to your regular diet.  Drink plenty of fluids but you should avoid alcoholic beverages for 24 hours. ? ?ACTIVITY:  You  should plan to take it easy for the rest of today and you should NOT DRIVE or use heavy machinery until tomorrow (because of the sedation medicines used during the test).   ? ?FOLLOW UP: ?Our staff will call the number listed on your records 48-72 hours following your procedure to check on you and address any questions or concerns that you may have regarding the information given to you following your procedure. If we do not reach you, we will leave a message.  We will attempt to reach you two times.  During this call, we will ask if you have developed any symptoms of COVID 19. If you develop any symptoms (ie: fever, flu-like symptoms, shortness of breath, cough etc.) before then, please call [phone].  If you test positive for Covid 19 in the 2 weeks post procedure, please call and report this information to [place].   ? ?If any biopsies were taken you will be contacted by phone or by letter within the next 1-3 weeks.  Please call us at [phone] if you have not heard about the biopsies in 3 weeks.  ? ? ?SIGNATURES/CONFIDENTIALITY: ?You and/or your care partner have signed paperwork which will be entered into your electronic medical record.  These signatures attest to the fact that that the information above on your After Visit Summary has been reviewed and is understood.  Full responsibility of the confidentiality of this discharge information lies with you and/or your care-partner. ? ?

## 2022-01-17 NOTE — Progress Notes (Signed)
Called to room to assist during endoscopic procedure.  Patient ID and intended procedure confirmed with present staff. Received instructions for my participation in the procedure from the performing physician.  

## 2022-01-17 NOTE — Op Note (Signed)
[place] ?Patient Name: [name] ?Procedure Date: 01/17/2022 8:57 AM ?MRN: [id number] ?Endoscopist: Justice Britain , MD ?Age: 56 ?Referring MD:  ?Date of Birth: 16-Oct-1965 ?Gender: Female ?Account #: [id number] ?Procedure:                Colonoscopy ?Indications:              Screening for colorectal malignant neoplasm,  ?                          Incidental - Hematochezia ?Medicines:                Monitored Anesthesia Care ?Procedure:                Pre-Anesthesia Assessment: ?                          - Prior to the procedure, a History and Physical  ?                          was performed, and patient medications and  ?                          allergies were reviewed. The patient's tolerance of  ?                          previous anesthesia was also reviewed. The risks  ?                          and benefits of the procedure and the sedation  ?                          options and risks were discussed with the patient.  ?                          All questions were answered, and informed consent  ?                          was obtained. Prior Anticoagulants: The patient has  ?                          taken no previous anticoagulant or antiplatelet  ?                          agents except for NSAID medication. ASA Grade  ?                          Assessment: II - A patient with mild systemic  ?                          disease. After reviewing the risks and benefits,  ?                          the patient was deemed in satisfactory condition to  ?  undergo the procedure. ?                          After obtaining informed consent, the colonoscope  ?                          was passed under direct vision. Throughout the  ?                          procedure, the patient's blood pressure, pulse, and  ?                          oxygen saturations were monitored continuously. The  ?                          0405 PCF-H190TL Slim SB Colonoscope was introduced  ?                           through the anus and advanced to the the cecum,  ?                          identified by appendiceal orifice and ileocecal  ?                          valve. The colonoscopy was somewhat difficult due  ?                          to a tortuous colon. Successful completion of the  ?                          procedure was aided by changing the patient's  ?                          position, using manual pressure, withdrawing and  ?                          reinserting the scope, straightening and shortening  ?                          the scope to obtain bowel loop reduction and using  ?                          scope torsion. The patient tolerated the procedure.  ?                          The quality of the bowel preparation was adequate.  ?                          The ileocecal valve, appendiceal orifice, and  ?                          rectum were photographed. ?Scope In: 9:02:43 AM ?Scope Out: 9:21:51 AM ?Scope Withdrawal Time: 0 hours 14 minutes 26 seconds  ?Total Procedure Duration: 0 hours  19 minutes 8 seconds  ?Findings:                 The digital rectal exam findings include  ?                          hemorrhoids. Pertinent negatives include no  ?                          palpable rectal lesions. ?                          The left colon was significantly tortuous. ?                          A moderate amount of semi-liquid stool was found in  ?                          the entire colon, making visualization difficult.  ?                          Lavage of the area was performed using copious  ?                          amounts, resulting in clearance with adequate  ?                          visualization. ?                          Four sessile polyps were found in the rectum,  ?                          descending colon and transverse colon. The polyps  ?                          were 3 to 8 mm in size. These polyps were removed  ?                          with a cold snare.  Resection and retrieval were  ?                          complete. ?                          Many small-mouthed diverticula were found in the  ?                          recto-sigmoid colon, sigmoid colon and descending  ?                          colon. ?                          Normal mucosa was found in the entire colon  ?  otherwise. ?                          Non-bleeding non-thrombosed internal hemorrhoids  ?                          were found during retroflexion, during perianal  ?                          exam and during digital exam. The hemorrhoids were  ?                          Grade II (internal hemorrhoids that prolapse but  ?                          reduce spontaneously). ?Complications:            No immediate complications. ?Estimated Blood Loss:     Estimated blood loss was minimal. ?Impression:               - Hemorrhoids found on digital rectal exam. ?                          - Stool in the entire examined colon - lavaged with  ?                          adequate visualization. ?                          - Four 3 to 8 mm polyps in the rectum, in the  ?                          descending colon and in the transverse colon,  ?                          removed with a cold snare. Resected and retrieved. ?                          - Diverticulosis in the recto-sigmoid colon, in the  ?                          sigmoid colon and in the descending colon. ?                          - Diverticular disease led to tortuous left colon. ?                          - Normal mucosa in the entire examined colon  ?                          otherwise. ?                          - Non-bleeding non-thrombosed internal hemorrhoids. ?Recommendation:           - The patient will be observed post-procedure,  ?  until all discharge criteria are met. ?                          - Discharge patient to home. ?                          - Patient has a contact number  available for  ?                          emergencies. The signs and symptoms of potential  ?                          delayed complications were discussed with the  ?                          patient. Return to normal activities tomorrow.  ?                          Written discharge instructions were provided to the  ?                          patient. ?                          - High fiber diet. ?                          - Use FiberCon 1-2 tablets PO daily. ?                          - Continue present medications. ?                          - Await pathology results. ?                          - Repeat colonoscopy in 3/02/04/09 years for  ?                          surveillance based on pathology results and  ?                          findings of adenomatous tissue. ?                          - Repeat Colonoscopy with Ultraslim colonoscope and  ?                          pressure vs Pediatric colonoscope is reasonable. ?                          - The findings and recommendations were discussed  ?                          with the patient. ?                          -  The findings and recommendations were discussed  ?                          with the designated responsible adult. ?Justice Britain, MD ?01/17/2022 9:28:16 AM ?

## 2022-01-17 NOTE — Progress Notes (Signed)
Report given to PACU, vss 

## 2022-01-17 NOTE — Progress Notes (Signed)
? ?GASTROENTEROLOGY PROCEDURE H&P NOTE  ? ?Primary Care Physician: ?[name], Betty G, MD ? ?HPI: ?[name] is a 56 y.o. female who presents for Colonoscopy for screening and history of prior diverticulitis/diverticulosis and rectal bleeding. ? ?Past Medical History:  ?Diagnosis Date  ? Allergy   ? seasonal  ? Anxiety   ? Arthritis   ? Cataract   ? bilateral  ? Chronic back pain   ? Degenerative spinal arthritis   ? Depression   ? "had in the past,no medications now"  ? Dercum's disease   ? Fibromyalgia   ? ?Past Surgical History:  ?Procedure Laterality Date  ? APPENDECTOMY    ? BACK SURGERY    ? CHOLECYSTECTOMY    ? DENTAL SURGERY    ? FRACTURE SURGERY    ? nose  ? NOSE SURGERY    ? 1977  ? SPINAL FUSION    ? TONSILLECTOMY    ? TUMOR REMOVAL Left   ? lipoma under breast  ? ?Current Outpatient Medications  ?Medication Sig Dispense Refill  ? acetaminophen (TYLENOL) 500 MG tablet Take 1,000 mg by mouth every 6 (six) hours as needed for mild pain or moderate pain.    ? Artificial Saliva (BIOTENE DRY MOUTH) GUM Use as directed 1 each in the mouth or throat daily as needed (Dry mouth).    ? buprenorphine (BUTRANS) 15 MCG/HR 1 patch once a week.    ? clotrimazole-betamethasone (LOTRISONE) cream APPLY TO AFFECTED AREA TWICE A DAY 30 g 0  ? diphenhydrAMINE (BENADRYL) 25 mg capsule Take 25 mg by mouth every 6 (six) hours as needed.    ? DULoxetine (CYMBALTA) 60 MG capsule Take 60 mg by mouth daily.    ? EUCALYPTUS OIL EX Apply 1 application topically once a week.    ? gabapentin (NEURONTIN) 600 MG tablet Take 1,200 mg by mouth 3 (three) times daily.    ? meloxicam (MOBIC) 15 MG tablet Take 15 mg by mouth daily.    ? OVER THE COUNTER MEDICATION Use as directed 1 application in the mouth or throat daily as needed (mouth sore).    ? traZODone (DESYREL) 100 MG tablet Take 100 mg by mouth at bedtime.    ? ?Current Facility-Administered Medications  ?Medication Dose Route Frequency Provider Last Rate Last Admin  ? 0.9  %  sodium chloride infusion  500 mL Intravenous Once Mansouraty, Telford Nab., MD      ? ? ?Current Outpatient Medications:  ?  acetaminophen (TYLENOL) 500 MG tablet, Take 1,000 mg by mouth every 6 (six) hours as needed for mild pain or moderate pain., Disp: , Rfl:  ?  Artificial Saliva (BIOTENE DRY MOUTH) GUM, Use as directed 1 each in the mouth or throat daily as needed (Dry mouth)., Disp: , Rfl:  ?  buprenorphine (BUTRANS) 15 MCG/HR, 1 patch once a week., Disp: , Rfl:  ?  clotrimazole-betamethasone (LOTRISONE) cream, APPLY TO AFFECTED AREA TWICE A DAY, Disp: 30 g, Rfl: 0 ?  diphenhydrAMINE (BENADRYL) 25 mg capsule, Take 25 mg by mouth every 6 (six) hours as needed., Disp: , Rfl:  ?  DULoxetine (CYMBALTA) 60 MG capsule, Take 60 mg by mouth daily., Disp: , Rfl:  ?  EUCALYPTUS OIL EX, Apply 1 application topically once a week., Disp: , Rfl:  ?  gabapentin (NEURONTIN) 600 MG tablet, Take 1,200 mg by mouth 3 (three) times daily., Disp: , Rfl:  ?  meloxicam (MOBIC) 15 MG tablet, Take 15 mg by mouth daily.,  Disp: , Rfl:  ?  OVER THE COUNTER MEDICATION, Use as directed 1 application in the mouth or throat daily as needed (mouth sore)., Disp: , Rfl:  ?  traZODone (DESYREL) 100 MG tablet, Take 100 mg by mouth at bedtime., Disp: , Rfl:  ? ?Current Facility-Administered Medications:  ?  0.9 %  sodium chloride infusion, 500 mL, Intravenous, Once, Mansouraty, Telford Nab., MD ?Allergies  ?Allergen Reactions  ? Pneumovax 23 [Pneumococcal Vac Polyvalent] Other (See Comments)  ?  Arm mass  ? Quetiapine Fumarate Nausea Only  ?  Nausea, headache and lethargy  ? Seroquel [Quetiapine Fumarate] Nausea Only  ?  Nausea, headache and lethargy  ? ?Family History  ?Problem Relation Age of Onset  ? Aneurysm Mother   ? Colon polyps Father   ? Lung cancer Father   ? Heart disease Brother   ?     CABG at 22, fatal MI at 3  ? Heart disease Brother   ? Colon cancer Neg Hx   ? Esophageal cancer Neg Hx   ? Inflammatory bowel disease Neg Hx   ?  Liver disease Neg Hx   ? Pancreatic cancer Neg Hx   ? Rectal cancer Neg Hx   ? Stomach cancer Neg Hx   ? Celiac disease Neg Hx   ? Crohn's disease Neg Hx   ? ?Social History  ? ?Socioeconomic History  ? Marital status: Divorced  ?  Spouse name: Not on file  ? Number of children: 3  ? Years of education: Not on file  ? Highest education level: Not on file  ?Occupational History  ? Not on file  ?Tobacco Use  ? Smoking status: Every Day  ?  Packs/day: 0.50  ?  Years: 23.00  ?  Pack years: 11.50  ?  Types: Cigarettes  ?  Start date: 10  ?  Passive exposure: Current  ? Smokeless tobacco: Never  ?Vaping Use  ? Vaping Use: Never used  ?Substance and Sexual Activity  ? Alcohol use: No  ? Drug use: No  ? Sexual activity: Not Currently  ?Other Topics Concern  ? Not on file  ?Social History Narrative  ? Not on file  ? ?Social Determinants of Health  ? ?Financial Resource Strain: Low Risk   ? Difficulty of Paying Living Expenses: Not hard at all  ?Food Insecurity: No Food Insecurity  ? Worried About Charity fundraiser in the Last Year: Never true  ? Ran Out of Food in the Last Year: Never true  ?Transportation Needs: No Transportation Needs  ? Lack of Transportation (Medical): No  ? Lack of Transportation (Non-Medical): No  ?Physical Activity: Inactive  ? Days of Exercise per Week: 0 days  ? Minutes of Exercise per Session: 0 min  ?Stress: Stress Concern Present  ? Feeling of Stress : To some extent  ?Social Connections: Moderately Isolated  ? Frequency of Communication with Friends and Family: More than three times a week  ? Frequency of Social Gatherings with Friends and Family: More than three times a week  ? Attends Religious Services: More than 4 times per year  ? Active Member of Clubs or Organizations: No  ? Attends Archivist Meetings: Never  ? Marital Status: Divorced  ?Intimate Partner Violence: Not At Risk  ? Fear of Current or Ex-Partner: No  ? Emotionally Abused: No  ? Physically Abused: No  ? Sexually  Abused: No  ? ? ?Physical Exam: ?Today's Vitals  ? 01/17/22  4356  ?BP: 126/62  ?Pulse: 78  ?Temp: (!) 96.6 ?F (35.9 ?C)  ?TempSrc: Temporal  ?SpO2: 96%  ?Weight: 227 lb (103 kg)  ?Height: '5\' 3"'$  (1.6 m)  ? ?Body mass index is 40.21 kg/m?. ?GEN: NAD ?EYE: Sclerae anicteric ?ENT: MMM ?CV: Non-tachycardic ?GI: Soft, NT/ND ?NEURO:  Alert & Oriented x 3 ? ?Lab Results: ?No results for input(s): WBC, HGB, HCT, PLT in the last 72 hours. ?BMET ?No results for input(s): NA, K, CL, CO2, GLUCOSE, BUN, CREATININE, CALCIUM in the last 72 hours. ?LFT ?No results for input(s): PROT, ALBUMIN, AST, ALT, ALKPHOS, BILITOT, BILIDIR, IBILI in the last 72 hours. ?PT/INR ?No results for input(s): LABPROT, INR in the last 72 hours. ? ? ?Impression / Plan: ?This is a 56 y.o.female who presents for Colonoscopy for screening and history of prior diverticulitis/diverticulosis and rectal bleeding. ? ?The risks and benefits of endoscopic evaluation/treatment were discussed with the patient and/or family; these include but are not limited to the risk of perforation, infection, bleeding, missed lesions, lack of diagnosis, severe illness requiring hospitalization, as well as anesthesia and sedation related illnesses.  The patient's history has been reviewed, patient examined, no change in status, and deemed stable for procedure.  The patient and/or family is agreeable to proceed.  ? ? ?Justice Britain, MD ?[facility] ?Advanced Endoscopy ?Office # [phone] ? ?

## 2022-01-19 ENCOUNTER — Telehealth: Payer: Self-pay

## 2022-01-19 NOTE — Telephone Encounter (Signed)
Line busy

## 2022-01-22 ENCOUNTER — Encounter: Payer: Self-pay | Admitting: Gastroenterology

## 2022-01-26 ENCOUNTER — Encounter: Payer: Self-pay | Admitting: Family Medicine

## 2022-01-26 ENCOUNTER — Other Ambulatory Visit: Payer: Medicare Other

## 2022-01-26 ENCOUNTER — Telehealth (INDEPENDENT_AMBULATORY_CARE_PROVIDER_SITE_OTHER): Payer: Medicare Other | Admitting: Family Medicine

## 2022-01-26 VITALS — Ht 63.0 in

## 2022-01-26 DIAGNOSIS — R300 Dysuria: Secondary | ICD-10-CM | POA: Diagnosis not present

## 2022-01-26 DIAGNOSIS — N390 Urinary tract infection, site not specified: Secondary | ICD-10-CM | POA: Diagnosis not present

## 2022-01-26 LAB — POCT URINALYSIS DIPSTICK
Bilirubin, UA: NEGATIVE
Blood, UA: POSITIVE
Glucose, UA: NEGATIVE
Ketones, UA: POSITIVE
Nitrite, UA: POSITIVE
Protein, UA: POSITIVE — AB
Spec Grav, UA: 1.015 (ref 1.010–1.025)
Urobilinogen, UA: 0.2 E.U./dL
pH, UA: 6 (ref 5.0–8.0)

## 2022-01-26 MED ORDER — NITROFURANTOIN MONOHYD MACRO 100 MG PO CAPS: 100.0000 mg | ORAL_CAPSULE | Freq: Two times a day (BID) | ORAL | 0 refills | Status: AC

## 2022-01-26 NOTE — Progress Notes (Signed)
Virtual Visit via Video Note ?I connected with [name] on 01/26/22 by a video enabled telemedicine application and verified that I am speaking with the correct person using two identifiers. ? Location patient: home ?Location provider:work office ?Persons participating in the virtual visit: patient, provider ? ?I discussed the limitations of evaluation and management by telemedicine and the availability of in person appointments. The patient expressed understanding and agreed to proceed. ? ?Chief Complaint  ?Patient presents with  ? Urinary Tract Infection  ?  Frequency, burning and slight chills.  ? ?HPI: ?[name] is a 56 year old female with history of chronic back pain, seasonal allergies, anxiety, tobacco use disorder,and fibromyalgia complaining of 3 to 4 days of urinary symptoms. ? ?Urinary Tract Infection  ?This is a new problem. The current episode started in the past 7 days. The problem has been unchanged. The quality of the pain is described as burning. The pain is at a severity of 0/10. The patient is experiencing no pain. There has been no fever. She is Not sexually active. There is No history of pyelonephritis. Associated symptoms include chills, frequency and urgency. Pertinent negatives include no discharge, flank pain, hematuria, hesitancy, nausea, sweats or vomiting. The treatment provided mild relief. There is no history of recurrent UTIs.  ?No hx of UTI's. ?She increased fluid intake. ? ?She thinks it may have been trigger by recent colonoscopy, 01/17/22. ?She just moved in with her daughter, has done some lifting. ? ?Negative for associated change in appetite, unusual fatigue, abdominal pain, worsening back pain, or skin rash. ?She tried OTC Azo. ?Reports hx of microscopic hematuria, she follows with urologist around every 5 years, work-up has been negative. ? ?ROS: See pertinent positives and negatives per HPI. ? ?Past Medical History:  ?Diagnosis Date  ? Allergy   ? seasonal  ?  Anxiety   ? Arthritis   ? Cataract   ? bilateral  ? Chronic back pain   ? Degenerative spinal arthritis   ? Depression   ? "had in the past,no medications now"  ? Dercum's disease   ? Fibromyalgia   ? ?Past Surgical History:  ?Procedure Laterality Date  ? APPENDECTOMY    ? BACK SURGERY    ? CHOLECYSTECTOMY    ? DENTAL SURGERY    ? FRACTURE SURGERY    ? nose  ? NOSE SURGERY    ? 1977  ? SPINAL FUSION    ? TONSILLECTOMY    ? TUMOR REMOVAL Left   ? lipoma under breast  ? ?Family History  ?Problem Relation Age of Onset  ? Aneurysm Mother   ? Colon polyps Father   ? Lung cancer Father   ? Heart disease Brother   ?     CABG at 54, fatal MI at 2  ? Heart disease Brother   ? Colon cancer Neg Hx   ? Esophageal cancer Neg Hx   ? Inflammatory bowel disease Neg Hx   ? Liver disease Neg Hx   ? Pancreatic cancer Neg Hx   ? Rectal cancer Neg Hx   ? Stomach cancer Neg Hx   ? Celiac disease Neg Hx   ? Crohn's disease Neg Hx   ? ? ?Social History  ? ?Socioeconomic History  ? Marital status: Divorced  ?  Spouse name: Not on file  ? Number of children: 3  ? Years of education: Not on file  ? Highest education level: Not on file  ?Occupational History  ? Not on file  ?  Tobacco Use  ? Smoking status: Every Day  ?  Packs/day: 0.50  ?  Years: 23.00  ?  Pack years: 11.50  ?  Types: Cigarettes  ?  Start date: 27  ?  Passive exposure: Current  ? Smokeless tobacco: Never  ?Vaping Use  ? Vaping Use: Never used  ?Substance and Sexual Activity  ? Alcohol use: No  ? Drug use: No  ? Sexual activity: Not Currently  ?Other Topics Concern  ? Not on file  ?Social History Narrative  ? Not on file  ? ?Social Determinants of Health  ? ?Financial Resource Strain: Low Risk   ? Difficulty of Paying Living Expenses: Not hard at all  ?Food Insecurity: No Food Insecurity  ? Worried About Charity fundraiser in the Last Year: Never true  ? Ran Out of Food in the Last Year: Never true  ?Transportation Needs: No Transportation Needs  ? Lack of Transportation  (Medical): No  ? Lack of Transportation (Non-Medical): No  ?Physical Activity: Inactive  ? Days of Exercise per Week: 0 days  ? Minutes of Exercise per Session: 0 min  ?Stress: Stress Concern Present  ? Feeling of Stress : To some extent  ?Social Connections: Moderately Isolated  ? Frequency of Communication with Friends and Family: More than three times a week  ? Frequency of Social Gatherings with Friends and Family: More than three times a week  ? Attends Religious Services: More than 4 times per year  ? Active Member of Clubs or Organizations: No  ? Attends Archivist Meetings: Never  ? Marital Status: Divorced  ?Intimate Partner Violence: Not At Risk  ? Fear of Current or Ex-Partner: No  ? Emotionally Abused: No  ? Physically Abused: No  ? Sexually Abused: No  ? ?Current Outpatient Medications:  ?  acetaminophen (TYLENOL) 500 MG tablet, Take 1,000 mg by mouth every 6 (six) hours as needed for mild pain or moderate pain., Disp: , Rfl:  ?  Artificial Saliva (BIOTENE DRY MOUTH) GUM, Use as directed 1 each in the mouth or throat daily as needed (Dry mouth)., Disp: , Rfl:  ?  buprenorphine (BUTRANS) 15 MCG/HR, 1 patch once a week., Disp: , Rfl:  ?  clotrimazole-betamethasone (LOTRISONE) cream, APPLY TO AFFECTED AREA TWICE A DAY, Disp: 30 g, Rfl: 0 ?  diphenhydrAMINE (BENADRYL) 25 mg capsule, Take 25 mg by mouth every 6 (six) hours as needed., Disp: , Rfl:  ?  DULoxetine (CYMBALTA) 60 MG capsule, Take 60 mg by mouth daily., Disp: , Rfl:  ?  EUCALYPTUS OIL EX, Apply 1 application topically once a week., Disp: , Rfl:  ?  gabapentin (NEURONTIN) 600 MG tablet, Take 1,200 mg by mouth 3 (three) times daily., Disp: , Rfl:  ?  meloxicam (MOBIC) 15 MG tablet, Take 15 mg by mouth daily., Disp: , Rfl:  ?  OVER THE COUNTER MEDICATION, Use as directed 1 application in the mouth or throat daily as needed (mouth sore)., Disp: , Rfl:  ?  traZODone (DESYREL) 100 MG tablet, Take 100 mg by mouth at bedtime., Disp: , Rfl:   ? ?EXAM: ? ?VITALS per patient if applicable:Ht '5\' 3"'$  (1.6 m)   LMP 07/17/2014 Comment: Pt stated that she is going through menopause and that she will go for 6-8 months without a period and then have it twice a month.   BMI 40.21 kg/m?  ? ?GENERAL: alert, oriented, appears well and in no acute distress ? ?HEENT: atraumatic, conjunctiva  clear, no obvious abnormalities on inspection. ? ?NECK: normal movements of the head and neck ? ?LUNGS: on inspection no signs of respiratory distress, breathing rate appears normal, no obvious gross SOB, gasping or wheezing ? ?CV: no obvious cyanosis ? ?MS: moves all visible extremities without noticeable abnormality ? ?PSYCH/NEURO: pleasant and cooperative, no obvious depression or anxiety, speech and thought processing grossly intact ? ?ASSESSMENT AND PLAN: ? ?Discussed the following assessment and plan: ? ?Dysuria - Plan: POCT urinalysis dipstick, Culture, Urine ?We discussed differential diagnosis. ?Urine dipstick today has a few abnormalities, some could be attributed to Azo. ?3+ leukocytes, positive blood, positive ketones and protein. ?Urine culture ordered. ? ?Urinary tract infection without hematuria, site unspecified - Plan: nitrofurantoin, macrocrystal-monohydrate, (MACROBID) 100 MG capsule ?Will treat empirically with Macrobid. ?Continue adequate hydration. ?Treatment will be tailored according to Ucx results and susceptibility report. ?Clearly instructed about warning signs. ?F/U if symptoms persist. ? ?We discussed possible serious and likely etiologies, options for evaluation and workup, limitations of telemedicine visit vs in person visit, treatment, treatment risks and precautions. ?The patient was advised to call back or seek an in-person evaluation if the symptoms worsen or if the condition fails to improve as anticipated. ?I discussed the assessment and treatment plan with the patient. The patient was provided an opportunity to ask questions and all were  answered. The patient agreed with the plan and demonstrated an understanding of the instructions. ? ?Return if symptoms worsen or fail to improve. ? ?Gail Mazur G. [name], MD ? ?[facility]. ?[place] office.

## 2022-01-28 LAB — URINE CULTURE
MICRO NUMBER:: 13326625
SPECIMEN QUALITY:: ADEQUATE

## 2022-02-08 ENCOUNTER — Other Ambulatory Visit: Payer: Self-pay | Admitting: *Deleted

## 2022-02-08 MED ORDER — CLOTRIMAZOLE-BETAMETHASONE 1-0.05 % EX CREA: TOPICAL_CREAM | CUTANEOUS | 0 refills | Status: DC

## 2022-02-12 ENCOUNTER — Ambulatory Visit (INDEPENDENT_AMBULATORY_CARE_PROVIDER_SITE_OTHER): Payer: Medicare Other

## 2022-02-12 VITALS — Ht 64.0 in | Wt 225.0 lb

## 2022-02-12 DIAGNOSIS — Z0000 Encounter for general adult medical examination without abnormal findings: Secondary | ICD-10-CM | POA: Diagnosis not present

## 2022-02-12 NOTE — Progress Notes (Signed)
?I connected with [name] today by telephone and verified that I am speaking with the correct person using two identifiers. ?Location patient: home ?Location provider: work ?Persons participating in the virtual visit: Nhu, Glasby LPN. ?  ?I discussed the limitations, risks, security and privacy concerns of performing an evaluation and management service by telephone and the availability of in person appointments. I also discussed with the patient that there may be a patient responsible charge related to this service. The patient expressed understanding and verbally consented to this telephonic visit.  ?  ?Interactive audio and video telecommunications were attempted between this provider and patient, however failed, due to patient having technical difficulties OR patient did not have access to video capability.  We continued and completed visit with audio only. ? ?  ? ?Vital signs may be patient reported or missing. ? ?Subjective:  ? [name] is a 56 y.o. female who presents for Medicare Annual (Subsequent) preventive examination. ? ?Review of Systems    ? ?Cardiac Risk Factors include: dyslipidemia;obesity (BMI >30kg/m2);smoking/ tobacco exposure ? ?   ?Objective:  ?  ?Today's Vitals  ? 02/12/22 1256 02/12/22 1257  ?Weight: 225 lb (102.1 kg)   ?Height: '5\' 4"'$  (1.626 m)   ?PainSc:  5   ? ?Body mass index is 38.62 kg/m?. ? ? ?  02/12/2022  ?  1:03 PM 09/26/2021  ?  2:49 PM 06/16/2021  ?  5:00 PM 02/07/2021  ?  2:28 PM 08/18/2014  ? 10:59 PM 07/18/2014  ?  8:38 AM  ?Advanced Directives  ?Does Patient Have a Medical Advance Directive? No No No No No No  ?Would patient like information on creating a medical advance directive?  No - Patient declined No - Patient declined Yes (MAU/Ambulatory/Procedural Areas - Information given) No - patient declined information No - patient declined information  ? ? ?Current Medications (verified) ?Outpatient Encounter Medications as of 02/12/2022   ?Medication Sig  ? acetaminophen (TYLENOL) 500 MG tablet Take 1,000 mg by mouth every 6 (six) hours as needed for mild pain or moderate pain.  ? Artificial Saliva (BIOTENE DRY MOUTH) GUM Use as directed 1 each in the mouth or throat daily as needed (Dry mouth).  ? buprenorphine (BUTRANS) 15 MCG/HR 1 patch once a week.  ? clotrimazole-betamethasone (LOTRISONE) cream APPLY TO AFFECTED AREA TWICE A DAY  ? diphenhydrAMINE (BENADRYL) 25 mg capsule Take 25 mg by mouth every 6 (six) hours as needed.  ? DULoxetine (CYMBALTA) 60 MG capsule Take 60 mg by mouth daily.  ? EUCALYPTUS OIL EX Apply 1 application topically once a week.  ? gabapentin (NEURONTIN) 600 MG tablet Take 1,200 mg by mouth 3 (three) times daily.  ? meloxicam (MOBIC) 15 MG tablet Take 15 mg by mouth daily.  ? OVER THE COUNTER MEDICATION Use as directed 1 application in the mouth or throat daily as needed (mouth sore).  ? traZODone (DESYREL) 100 MG tablet Take 100 mg by mouth at bedtime.  ? ?No facility-administered encounter medications on file as of 02/12/2022.  ? ? ?Allergies (verified) ?Pneumovax 23 [pneumococcal vac polyvalent], Quetiapine fumarate, and Seroquel [quetiapine fumarate]  ? ?History: ?Past Medical History:  ?Diagnosis Date  ? Allergy   ? seasonal  ? Anxiety   ? Arthritis   ? Cataract   ? bilateral  ? Chronic back pain   ? Degenerative spinal arthritis   ? Depression   ? "had in the past,no medications now"  ? Dercum's disease   ?  Fibromyalgia   ? ?Past Surgical History:  ?Procedure Laterality Date  ? APPENDECTOMY    ? BACK SURGERY    ? CHOLECYSTECTOMY    ? DENTAL SURGERY    ? FRACTURE SURGERY    ? nose  ? NOSE SURGERY    ? 1977  ? SPINAL FUSION    ? TONSILLECTOMY    ? TUMOR REMOVAL Left   ? lipoma under breast  ? ?Family History  ?Problem Relation Age of Onset  ? Aneurysm Mother   ? Colon polyps Father   ? Lung cancer Father   ? Heart disease Brother   ?     CABG at 11, fatal MI at 42  ? Heart disease Brother   ? Colon cancer Neg Hx   ?  Esophageal cancer Neg Hx   ? Inflammatory bowel disease Neg Hx   ? Liver disease Neg Hx   ? Pancreatic cancer Neg Hx   ? Rectal cancer Neg Hx   ? Stomach cancer Neg Hx   ? Celiac disease Neg Hx   ? Crohn's disease Neg Hx   ? ?Social History  ? ?Socioeconomic History  ? Marital status: Divorced  ?  Spouse name: Not on file  ? Number of children: 3  ? Years of education: Not on file  ? Highest education level: Not on file  ?Occupational History  ? Not on file  ?Tobacco Use  ? Smoking status: Every Day  ?  Packs/day: 0.50  ?  Years: 23.00  ?  Pack years: 11.50  ?  Types: Cigarettes  ?  Start date: 46  ?  Passive exposure: Current  ? Smokeless tobacco: Never  ?Vaping Use  ? Vaping Use: Never used  ?Substance and Sexual Activity  ? Alcohol use: No  ? Drug use: No  ? Sexual activity: Not Currently  ?Other Topics Concern  ? Not on file  ?Social History Narrative  ? Not on file  ? ?Social Determinants of Health  ? ?Financial Resource Strain: Low Risk   ? Difficulty of Paying Living Expenses: Not hard at all  ?Food Insecurity: No Food Insecurity  ? Worried About Charity fundraiser in the Last Year: Never true  ? Ran Out of Food in the Last Year: Never true  ?Transportation Needs: No Transportation Needs  ? Lack of Transportation (Medical): No  ? Lack of Transportation (Non-Medical): No  ?Physical Activity: Insufficiently Active  ? Days of Exercise per Week: 4 days  ? Minutes of Exercise per Session: 30 min  ?Stress: Stress Concern Present  ? Feeling of Stress : To some extent  ?Social Connections: Not on file  ? ? ?Tobacco Counseling ?Ready to quit: Not Answered ?Counseling given: Not Answered ? ? ?Clinical Intake: ? ?Pre-visit preparation completed: Yes ? ?Pain : 0-10 ?Pain Score: 5  ?Pain Type: Chronic pain ?Pain Location: Generalized ?Pain Descriptors / Indicators: Dull ?Pain Onset: More than a month ago ?Pain Frequency: Constant ? ?  ? ?Nutritional Status: BMI > 30  Obese ?Nutritional Risks: None ?Diabetes: No ? ?How  often do you need to have someone help you when you read instructions, pamphlets, or other written materials from your doctor or pharmacy?: 1 - Never ?What is the last grade level you completed in school?: 14 yrs ? ?Diabetic? no ? ?Interpreter Needed?: No ? ?Information entered by :: NAllen LPN ? ? ?Activities of Daily Living ? ?  02/12/2022  ?  1:04 PM 09/26/2021  ?  2:52  PM  ?In your present state of health, do you have any difficulty performing the following activities:  ?Hearing? 0   ?Vision? 1   ?Comment a little blurry   ?Difficulty concentrating or making decisions? 0   ?Walking or climbing stairs? 1   ?Dressing or bathing? 0   ?Doing errands, shopping? 0 0  ?Preparing Food and eating ? N   ?Using the Toilet? N   ?In the past six months, have you accidently leaked urine? N   ?Do you have problems with loss of bowel control? N   ?Managing your Medications? N   ?Managing your Finances? N   ? ? ?Patient Care Team: ?[name], Betty G, MD as PCP - General (Family Medicine) ? ?Indicate any recent Medical Services you may have received from other than [facility] providers in the past year (date may be approximate). ? ?   ?Assessment:  ? This is a routine wellness examination for [name]. ? ?Hearing/Vision screen ?Vision Screening - Comments:: Regular eye exams, Dr. Gershon Crane ? ?Dietary issues and exercise activities discussed: ?Current Exercise Habits: Home exercise routine, Type of exercise: walking, Time (Minutes): 30, Frequency (Times/Week): 4, Weekly Exercise (Minutes/Week): 120 ? ? Goals Addressed   ? ?  ?  ?  ?  ? This Visit's Progress  ?  Patient Stated     ?  02/12/2022, wants to exercise more ?  ? ?  ? ?Depression Screen ? ?  02/12/2022  ?  1:04 PM 04/19/2021  ?  3:26 PM 02/07/2021  ?  2:34 PM 11/03/2020  ?  2:13 PM  ?PHQ 2/9 Scores  ?PHQ - 2 Score 0 3 0 0  ?PHQ- 9 Score  11    ?  ?Fall Risk ? ?  02/12/2022  ?  1:04 PM 02/07/2021  ?  2:32 PM  ?Fall Risk   ?Falls in the past year? 0 0  ?Number falls in past yr: 0 0  ?Injury  with Fall? 0 0  ?Risk for fall due to : Medication side effect   ?Follow up Falls evaluation completed;Education provided;Falls prevention discussed Falls prevention discussed  ? ? ?FALL RISK PREVENTION PERTAIN

## 2022-02-12 NOTE — Patient Instructions (Signed)
[name] , ?Thank you for taking time to come for your Medicare Wellness Visit. I appreciate your ongoing commitment to your health goals. Please review the following plan we discussed and let me know if I can assist you in the future.  ? ?Screening recommendations/referrals: ?Colonoscopy: completed 01/17/2022, due 01/17/2025 ?Mammogram: patient to schedule ?Bone Density: n/a ?Recommended yearly ophthalmology/optometry visit for glaucoma screening and checkup ?Recommended yearly dental visit for hygiene and checkup ? ?Vaccinations: ?Influenza vaccine: due 05/01/2022 ?Pneumococcal vaccine: allergy ?Tdap vaccine: due ?Shingles vaccine: needs second dose  ?Covid-19:  07/12/2020, 01/11/2020, 12/17/2019 ? ?Advanced directives: Advance directive discussed with you today.  ? ?Conditions/risks identified: smoking ? ?Next appointment: Follow up in one year for your annual wellness visit.  ? ?Preventive Care 40-64 Years, Female ?Preventive care refers to lifestyle choices and visits with your health care provider that can promote health and wellness. ?What does preventive care include? ?A yearly physical exam. This is also called an annual well check. ?Dental exams once or twice a year. ?Routine eye exams. Ask your health care provider how often you should have your eyes checked. ?Personal lifestyle choices, including: ?Daily care of your teeth and gums. ?Regular physical activity. ?Eating a healthy diet. ?Avoiding tobacco and drug use. ?Limiting alcohol use. ?Practicing safe sex. ?Taking low-dose aspirin daily starting at age 47. ?Taking vitamin and mineral supplements as recommended by your health care provider. ?What happens during an annual well check? ?The services and screenings done by your health care provider during your annual well check will depend on your age, overall health, lifestyle risk factors, and family history of disease. ?Counseling  ?Your health care provider may ask you questions about your: ?Alcohol  use. ?Tobacco use. ?Drug use. ?Emotional well-being. ?Home and relationship well-being. ?Sexual activity. ?Eating habits. ?Work and work Statistician. ?Method of birth control. ?Menstrual cycle. ?Pregnancy history. ?Screening  ?You may have the following tests or measurements: ?Height, weight, and BMI. ?Blood pressure. ?Lipid and cholesterol levels. These may be checked every 5 years, or more frequently if you are over 38 years old. ?Skin check. ?Lung cancer screening. You may have this screening every year starting at age 33 if you have a 30-pack-year history of smoking and currently smoke or have quit within the past 15 years. ?Fecal occult blood test (FOBT) of the stool. You may have this test every year starting at age 34. ?Flexible sigmoidoscopy or colonoscopy. You may have a sigmoidoscopy every 5 years or a colonoscopy every 10 years starting at age 31. ?Hepatitis C blood test. ?Hepatitis B blood test. ?Sexually transmitted disease (STD) testing. ?Diabetes screening. This is done by checking your blood sugar (glucose) after you have not eaten for a while (fasting). You may have this done every 1-3 years. ?Mammogram. This may be done every 1-2 years. Talk to your health care provider about when you should start having regular mammograms. This may depend on whether you have a family history of breast cancer. ?BRCA-related cancer screening. This may be done if you have a family history of breast, ovarian, tubal, or peritoneal cancers. ?Pelvic exam and Pap test. This may be done every 3 years starting at age 47. Starting at age 66, this may be done every 5 years if you have a Pap test in combination with an HPV test. ?Bone density scan. This is done to screen for osteoporosis. You may have this scan if you are at high risk for osteoporosis. ?Discuss your test results, treatment options, and if necessary, the  need for more tests with your health care provider. ?Vaccines  ?Your health care provider may recommend  certain vaccines, such as: ?Influenza vaccine. This is recommended every year. ?Tetanus, diphtheria, and acellular pertussis (Tdap, Td) vaccine. You may need a Td booster every 10 years. ?Zoster vaccine. You may need this after age 61. ?Pneumococcal 13-valent conjugate (PCV13) vaccine. You may need this if you have certain conditions and were not previously vaccinated. ?Pneumococcal polysaccharide (PPSV23) vaccine. You may need one or two doses if you smoke cigarettes or if you have certain conditions. ?Talk to your health care provider about which screenings and vaccines you need and how often you need them. ?This information is not intended to replace advice given to you by your health care provider. Make sure you discuss any questions you have with your health care provider. ?Document Released: 10/14/2015 Document Revised: 06/06/2016 Document Reviewed: 07/19/2015 ?Elsevier Interactive Patient Education ? 2017 [employer] ? ? ? ?Fall Prevention in the Home ?Falls can cause injuries. They can happen to people of all ages. There are many things you can do to make your home safe and to help prevent falls. ?What can I do on the outside of my home? ?Regularly fix the edges of walkways and driveways and fix any cracks. ?Remove anything that might make you trip as you walk through a door, such as a raised step or threshold. ?Trim any bushes or trees on the path to your home. ?Use bright outdoor lighting. ?Clear any walking paths of anything that might make someone trip, such as rocks or tools. ?Regularly check to see if handrails are loose or broken. Make sure that both sides of any steps have handrails. ?Any raised decks and porches should have guardrails on the edges. ?Have any leaves, snow, or ice cleared regularly. ?Use sand or salt on walking paths during winter. ?Clean up any spills in your garage right away. This includes oil or grease spills. ?What can I do in the bathroom? ?Use night lights. ?Install grab bars  by the toilet and in the tub and shower. Do not use towel bars as grab bars. ?Use non-skid mats or decals in the tub or shower. ?If you need to sit down in the shower, use a plastic, non-slip stool. ?Keep the floor dry. Clean up any water that spills on the floor as soon as it happens. ?Remove soap buildup in the tub or shower regularly. ?Attach bath mats securely with double-sided non-slip rug tape. ?Do not have throw rugs and other things on the floor that can make you trip. ?What can I do in the bedroom? ?Use night lights. ?Make sure that you have a light by your bed that is easy to reach. ?Do not use any sheets or blankets that are too big for your bed. They should not hang down onto the floor. ?Have a firm chair that has side arms. You can use this for support while you get dressed. ?Do not have throw rugs and other things on the floor that can make you trip. ?What can I do in the kitchen? ?Clean up any spills right away. ?Avoid walking on wet floors. ?Keep items that you use a lot in easy-to-reach places. ?If you need to reach something above you, use a strong step stool that has a grab bar. ?Keep electrical cords out of the way. ?Do not use floor polish or wax that makes floors slippery. If you must use wax, use non-skid floor wax. ?Do not have throw rugs and  other things on the floor that can make you trip. ?What can I do with my stairs? ?Do not leave any items on the stairs. ?Make sure that there are handrails on both sides of the stairs and use them. Fix handrails that are broken or loose. Make sure that handrails are as long as the stairways. ?Check any carpeting to make sure that it is firmly attached to the stairs. Fix any carpet that is loose or worn. ?Avoid having throw rugs at the top or bottom of the stairs. If you do have throw rugs, attach them to the floor with carpet tape. ?Make sure that you have a light switch at the top of the stairs and the bottom of the stairs. If you do not have them, ask  someone to add them for you. ?What else can I do to help prevent falls? ?Wear shoes that: ?Do not have high heels. ?Have rubber bottoms. ?Are comfortable and fit you well. ?Are closed at the toe. Do not wear

## 2022-03-01 DIAGNOSIS — M5412 Radiculopathy, cervical region: Secondary | ICD-10-CM | POA: Diagnosis not present

## 2022-03-20 DIAGNOSIS — G894 Chronic pain syndrome: Secondary | ICD-10-CM | POA: Diagnosis not present

## 2022-03-20 DIAGNOSIS — E882 Lipomatosis, not elsewhere classified: Secondary | ICD-10-CM | POA: Diagnosis not present

## 2022-03-20 DIAGNOSIS — M542 Cervicalgia: Secondary | ICD-10-CM | POA: Diagnosis not present

## 2022-03-20 DIAGNOSIS — M797 Fibromyalgia: Secondary | ICD-10-CM | POA: Diagnosis not present

## 2022-03-20 DIAGNOSIS — M4802 Spinal stenosis, cervical region: Secondary | ICD-10-CM | POA: Diagnosis not present

## 2022-03-20 DIAGNOSIS — M5412 Radiculopathy, cervical region: Secondary | ICD-10-CM | POA: Diagnosis not present

## 2022-03-29 ENCOUNTER — Other Ambulatory Visit: Payer: Self-pay | Admitting: Podiatry

## 2022-04-11 DIAGNOSIS — Z1231 Encounter for screening mammogram for malignant neoplasm of breast: Secondary | ICD-10-CM | POA: Diagnosis not present

## 2022-05-10 ENCOUNTER — Other Ambulatory Visit: Payer: Self-pay | Admitting: Family Medicine

## 2022-05-10 NOTE — Telephone Encounter (Signed)
Pt is calling and needs a refill on fluticasone propionate 90 day supply [id number] [place] [place] [address]:  [phone] Fax:  [phone]

## 2022-05-11 MED ORDER — FLUTICASONE PROPIONATE 50 MCG/ACT NA SUSP: 2.0000 | Freq: Every day | NASAL | 0 refills | Status: DC

## 2022-05-11 NOTE — Telephone Encounter (Signed)
Okay to send in 

## 2022-05-31 ENCOUNTER — Telehealth (INDEPENDENT_AMBULATORY_CARE_PROVIDER_SITE_OTHER): Payer: Medicare Other | Admitting: Family Medicine

## 2022-05-31 VITALS — Ht 63.5 in | Wt 225.0 lb

## 2022-05-31 DIAGNOSIS — M25561 Pain in right knee: Secondary | ICD-10-CM

## 2022-05-31 NOTE — Progress Notes (Signed)
Virtual Visit via Video Note  I connected with [name]  on 05/31/22 at  3:00 PM EDT by a video enabled telemedicine application and verified that I am speaking with the correct person using two identifiers.  Location patient: [place] Location provider:work or home office Persons participating in the virtual visit: patient, provider, duaghter  I discussed the limitations and requested verbal permission for telemedicine visit. The patient expressed understanding and agreed to proceed.   HPI:  Acute telemedicine visit for knee pain: -Onset: started last week a little while after giving a 58 year old "horsie rides" -Symptoms include: was improving, but then was walking a big dog who was pulling a lot and after knee was hurting. R knee hurting above the knee mostly and a little below the knee. ? Small amount of swelling medial knee. Pain has seemed to gradually worsen since.  -Denies: popping or immediate pain at any point during activities above (pain came on slowly after), fever, malaise, clicking/popping/catching, weakness, numbness, giving away, redness or warmth, swelling except she feels maybe a little medial R knee -Has tried:on pain patch, meloxicam and gabapentin for other pain issues -Pertinent past medical history: see below -Pertinent medication allergies: Allergies  Allergen Reactions   Pneumovax 23 [Pneumococcal Vac Polyvalent] Other (See Comments)    Arm mass   Quetiapine Fumarate Nausea Only    Nausea, headache and lethargy   Seroquel [Quetiapine Fumarate] Nausea Only    Nausea, headache and lethargy   -COVID-19 vaccine status:  Immunization History  Administered Date(s) Administered   Influenza, Quadrivalent, Recombinant, Inj, Pf 09/20/2020   Influenza,inj,Quad PF,6+ Mos 06/09/2021   Influenza-Unspecified 07/12/2020   PFIZER(Purple Top)SARS-COV-2 Vaccination 12/17/2019, 01/11/2020, 07/12/2020   Pneumococcal Polysaccharide-23 06/09/2021   Td 05/01/2006   Zoster Recombinat  (Shingrix) 05/03/2021     ROS: See pertinent positives and negatives per HPI.  Past Medical History:  Diagnosis Date   Allergy    seasonal   Anxiety    Arthritis    Cataract    bilateral   Chronic back pain    Degenerative spinal arthritis    Depression    "had in the past,no medications now"   Dercum's disease    Fibromyalgia     Past Surgical History:  Procedure Laterality Date   APPENDECTOMY     BACK SURGERY     CHOLECYSTECTOMY     DENTAL SURGERY     FRACTURE SURGERY     nose   NOSE SURGERY     1977   SPINAL FUSION     TONSILLECTOMY     TUMOR REMOVAL Left    lipoma under breast     Current Outpatient Medications:    acetaminophen (TYLENOL) 500 MG tablet, Take 1,000 mg by mouth every 6 (six) hours as needed for mild pain or moderate pain., Disp: , Rfl:    Artificial Saliva (BIOTENE DRY MOUTH) GUM, Use as directed 1 each in the mouth or throat daily as needed (Dry mouth)., Disp: , Rfl:    buprenorphine (BUTRANS) 15 MCG/HR, 1 patch once a week., Disp: , Rfl:    clotrimazole-betamethasone (LOTRISONE) cream, APPLY TO AFFECTED AREA TWICE A DAY, Disp: 30 g, Rfl: 0   DULoxetine (CYMBALTA) 60 MG capsule, Take 60 mg by mouth daily., Disp: , Rfl:    fluticasone (FLONASE) 50 MCG/ACT nasal spray, Place 2 sprays into both nostrils daily., Disp: 16 g, Rfl: 0   gabapentin (NEURONTIN) 600 MG tablet, Take 1,200 mg by mouth 3 (three) times daily., Disp: ,  Rfl:    meloxicam (MOBIC) 15 MG tablet, Take 15 mg by mouth daily., Disp: , Rfl:    OVER THE COUNTER MEDICATION, Use as directed 1 application in the mouth or throat daily as needed (mouth sore)., Disp: , Rfl:    traZODone (DESYREL) 100 MG tablet, Take 100 mg by mouth at bedtime., Disp: , Rfl:    diphenhydrAMINE (BENADRYL) 25 mg capsule, Take 25 mg by mouth every 6 (six) hours as needed. (Patient not taking: Reported on 05/31/2022), Disp: , Rfl:    EUCALYPTUS OIL EX, Apply 1 application topically once a week. (Patient not taking:  Reported on 05/31/2022), Disp: , Rfl:   EXAM:  VITALS per patient if applicable:  GENERAL: alert, oriented, appears well and in no acute distress  HEENT: atraumatic, conjunttiva clear, no obvious abnormalities on inspection of external nose and ears  NECK: normal movements of the head and neck  LUNGS: on inspection no signs of respiratory distress, breathing rate appears normal, no obvious gross SOB, gasping or wheezing  CV: no obvious cyanosis  MS: on video visit exam - moves all extremities, I do not appreciate obvious swelling or redness on gross exam, she points to the soft tissue above the knee and the patellar tendon region as main areas of pain, she reports pain as she flexes and extends her knee and with walking, gait slightly antalgic   PSYCH/NEURO: pleasant and cooperative, no obvious depression or anxiety, speech and thought processing grossly intact  ASSESSMENT AND PLAN:  Discussed the following assessment and plan:  Acute pain of right knee  -we discussed possible serious and likely etiologies, options for evaluation and workup, limitations of telemedicine visit vs in person visit, treatment, treatment risks and precautions. Pt is agreeable to treatment via telemedicine at this moment. Query tendinitis, OA, meniscal injury vs other. She has opted to try ice, topical menthol, gentle activity today and seek inperson evaluation at her ortho clinic (which has walking clinic) tomorrow or Monday if any worsening or if not improving.   Advised to seek prompt virtual visit or in person care if worsening, new symptoms arise, or if is not improving with treatment as expected per our conversation of expected course. Discussed options for follow up care. Did let this patient know that I do telemedicine on Tuesdays and Thursdays for [facility] and those are the days I am logged into the system. Advised to schedule follow up visit with PCP, [facility] virtual visits or [facility] if any further  questions or concerns to avoid delays in care.   I discussed the assessment and treatment plan with the patient. The patient was provided an opportunity to ask questions and all were answered. The patient agreed with the plan and demonstrated an understanding of the instructions.     Gail Kern, DO

## 2022-05-31 NOTE — Patient Instructions (Addendum)
Can try ice, topical menthol such as tiger balm and only gentle activities (no giving horseback rides or walking dogs until all better :))  I hope you are feeling better soon!  Seek in person care promptly as we discussed with your orthopedic office or with your primary care office if your symptoms worsen, new concerns arise or you are not improving with treatment.   It was nice to meet you today. I help [facility] out with telemedicine visits on Tuesdays and Thursdays and am happy to help if you need a virtual follow up visit on those days. Otherwise, if you have any concerns or questions following this visit please schedule a follow up visit with your Primary Care office or seek care at a local urgent care clinic to avoid delays in care. If you are having severe or life threatening symptoms please call 911 and/or go to the nearest emergency room.

## 2022-06-03 ENCOUNTER — Other Ambulatory Visit: Payer: Self-pay | Admitting: Family Medicine

## 2022-06-18 DIAGNOSIS — M542 Cervicalgia: Secondary | ICD-10-CM | POA: Diagnosis not present

## 2022-06-18 DIAGNOSIS — M4802 Spinal stenosis, cervical region: Secondary | ICD-10-CM | POA: Diagnosis not present

## 2022-06-18 DIAGNOSIS — G894 Chronic pain syndrome: Secondary | ICD-10-CM | POA: Diagnosis not present

## 2022-06-18 DIAGNOSIS — G8929 Other chronic pain: Secondary | ICD-10-CM | POA: Diagnosis not present

## 2022-06-18 DIAGNOSIS — E882 Lipomatosis, not elsewhere classified: Secondary | ICD-10-CM | POA: Diagnosis not present

## 2022-06-18 DIAGNOSIS — M797 Fibromyalgia: Secondary | ICD-10-CM | POA: Diagnosis not present

## 2022-06-18 DIAGNOSIS — Z79899 Other long term (current) drug therapy: Secondary | ICD-10-CM | POA: Diagnosis not present

## 2022-06-18 DIAGNOSIS — M5412 Radiculopathy, cervical region: Secondary | ICD-10-CM | POA: Diagnosis not present

## 2022-07-05 ENCOUNTER — Telehealth: Payer: Self-pay | Admitting: Family Medicine

## 2022-07-05 NOTE — Telephone Encounter (Signed)
Pt requesting an A1C lab

## 2022-07-06 NOTE — Progress Notes (Deleted)
HPI:  [name] is a 56 y.o. female, who is here today to follow on prediabetes.  Review of Systems Rest see pertinent positives and negatives per HPI.  Current Outpatient Medications on File Prior to Visit  Medication Sig Dispense Refill   acetaminophen (TYLENOL) 500 MG tablet Take 1,000 mg by mouth every 6 (six) hours as needed for mild pain or moderate pain.     Artificial Saliva (BIOTENE DRY MOUTH) GUM Use as directed 1 each in the mouth or throat daily as needed (Dry mouth).     buprenorphine (BUTRANS) 15 MCG/HR 1 patch once a week.     clotrimazole-betamethasone (LOTRISONE) cream APPLY TO AFFECTED AREA TWICE A DAY 30 g 0   diphenhydrAMINE (BENADRYL) 25 mg capsule Take 25 mg by mouth every 6 (six) hours as needed. (Patient not taking: Reported on 05/31/2022)     DULoxetine (CYMBALTA) 60 MG capsule Take 60 mg by mouth daily.     EUCALYPTUS OIL EX Apply 1 application topically once a week. (Patient not taking: Reported on 05/31/2022)     fluticasone (FLONASE) 50 MCG/ACT nasal spray SPRAY 2 SPRAYS INTO EACH NOSTRIL EVERY DAY 16 mL 1   gabapentin (NEURONTIN) 600 MG tablet Take 1,200 mg by mouth 3 (three) times daily.     meloxicam (MOBIC) 15 MG tablet Take 15 mg by mouth daily.     OVER THE COUNTER MEDICATION Use as directed 1 application in the mouth or throat daily as needed (mouth sore).     traZODone (DESYREL) 100 MG tablet Take 100 mg by mouth at bedtime.     No current facility-administered medications on file prior to visit.    Past Medical History:  Diagnosis Date   Allergy    seasonal   Anxiety    Arthritis    Cataract    bilateral   Chronic back pain    Degenerative spinal arthritis    Depression    "had in the past,no medications now"   Dercum's disease    Fibromyalgia    Allergies  Allergen Reactions   Pneumovax 23 [Pneumococcal Vac Polyvalent] Other (See Comments)    Arm mass   Quetiapine Fumarate Nausea Only    Nausea, headache and  lethargy   Seroquel [Quetiapine Fumarate] Nausea Only    Nausea, headache and lethargy    Social History   Socioeconomic History   Marital status: Divorced    Spouse name: Not on file   Number of children: 3   Years of education: Not on file   Highest education level: Not on file  Occupational History   Not on file  Tobacco Use   Smoking status: Every Day    Packs/day: 0.50    Years: 23.00    Total pack years: 11.50    Types: Cigarettes    Start date: 67    Passive exposure: Current   Smokeless tobacco: Never  Vaping Use   Vaping Use: Never used  Substance and Sexual Activity   Alcohol use: No   Drug use: No   Sexual activity: Not Currently  Other Topics Concern   Not on file  Social History Narrative   Not on file   Social Determinants of Health   Financial Resource Strain: Low Risk  (02/12/2022)   Overall Financial Resource Strain (CARDIA)    Difficulty of Paying Living Expenses: Not hard at all  Food Insecurity: No Food Insecurity (02/12/2022)   Hunger Vital Sign    Worried About  Running Out of Food in the Last Year: Never true    [place] in the Last Year: Never true  Transportation Needs: No Transportation Needs (02/12/2022)   PRAPARE - Hydrologist (Medical): No    Lack of Transportation (Non-Medical): No  Physical Activity: Insufficiently Active (02/12/2022)   Exercise Vital Sign    Days of Exercise per Week: 4 days    Minutes of Exercise per Session: 30 min  Stress: Stress Concern Present (02/12/2022)   [place]    Feeling of Stress : To some extent  Social Connections: Moderately Isolated (02/07/2021)   Social Connection and Isolation Panel [NHANES]    Frequency of Communication with Friends and Family: More than three times a week    Frequency of Social Gatherings with Friends and Family: More than three times a week    Attends Religious Services:  More than 4 times per year    Active Member of [employer] or Organizations: No    Attends Archivist Meetings: Never    Marital Status: Divorced    There were no vitals filed for this visit. There is no height or weight on file to calculate BMI.  Physical Exam  ASSESSMENT AND PLAN:   There are no diagnoses linked to this encounter.  No orders of the defined types were placed in this encounter.   No problem-specific Assessment & Plan notes found for this encounter.   No follow-ups on file.   Betty G. [name], MD  [facility]. [facility] office.

## 2022-07-06 NOTE — Telephone Encounter (Signed)
F/u scheduled for 07/09/22

## 2022-07-06 NOTE — Telephone Encounter (Signed)
She's due for follow up, A1C can be done during the visit.

## 2022-07-08 ENCOUNTER — Emergency Department (HOSPITAL_BASED_OUTPATIENT_CLINIC_OR_DEPARTMENT_OTHER)
Admission: EM | Admit: 2022-07-08 | Discharge: 2022-07-08 | Disposition: A | Discharge status: Home / Self Care | Payer: Medicare Other | Attending: Emergency Medicine | Admitting: Emergency Medicine

## 2022-07-08 ENCOUNTER — Emergency Department (HOSPITAL_BASED_OUTPATIENT_CLINIC_OR_DEPARTMENT_OTHER): Payer: Medicare Other | Admitting: Radiology

## 2022-07-08 ENCOUNTER — Other Ambulatory Visit: Payer: Self-pay

## 2022-07-08 DIAGNOSIS — M25569 Pain in unspecified knee: Secondary | ICD-10-CM | POA: Diagnosis not present

## 2022-07-08 DIAGNOSIS — X501XXA Overexertion from prolonged static or awkward postures, initial encounter: Secondary | ICD-10-CM | POA: Insufficient documentation

## 2022-07-08 DIAGNOSIS — S8981XA Other specified injuries of right lower leg, initial encounter: Secondary | ICD-10-CM | POA: Diagnosis not present

## 2022-07-08 DIAGNOSIS — Z743 Need for continuous supervision: Secondary | ICD-10-CM | POA: Diagnosis not present

## 2022-07-08 DIAGNOSIS — M25561 Pain in right knee: Secondary | ICD-10-CM | POA: Diagnosis not present

## 2022-07-08 MED ORDER — HYDROCODONE-ACETAMINOPHEN 5-325 MG PO TABS
1.0000 | ORAL_TABLET | Freq: Once | ORAL | Status: AC
  Administered 2022-07-08: 1 via ORAL
  Filled 2022-07-08: qty 1

## 2022-07-08 MED ORDER — OXYCODONE-ACETAMINOPHEN 5-325 MG PO TABS
1.0000 | ORAL_TABLET | ORAL | Status: DC | PRN
  Administered 2022-07-08: 1 via ORAL
  Filled 2022-07-08: qty 1

## 2022-07-08 MED ORDER — OXYCODONE-ACETAMINOPHEN 5-325 MG PO TABS: 1.0000 | ORAL_TABLET | Freq: Once | ORAL | Status: DC

## 2022-07-08 MED ORDER — SENNOSIDES-DOCUSATE SODIUM 8.6-50 MG PO TABS: 1.0000 | ORAL_TABLET | Freq: Every evening | ORAL | 0 refills | Status: DC | PRN

## 2022-07-08 MED ORDER — HYDROCODONE-ACETAMINOPHEN 5-325 MG PO TABS: 2.0000 | ORAL_TABLET | ORAL | 0 refills | Status: DC | PRN

## 2022-07-08 NOTE — ED Triage Notes (Signed)
Patient arrives via EMS due to sustaining a knee injury while getting into a truck. Patient was lifting her leg and heard a pop on her right knee. Patient states that she can't put weight onto her knee.  Recently sprained her knee ~1 month ago.  Rates pain an 11/10.  EMS Vitals:  118 sys 90 HR CBG 118  96% Room Air

## 2022-07-08 NOTE — Discharge Instructions (Signed)
Your workup was reassuring today that you do not have a bony injury or dislocation to your knee.  We recommend that you use the provided Ace wrap and crutches as needed, but you can continue to bear weight as tolerated.  Read through the included information about routine care for injuries.  Follow up as recommended if you are still having problems in about a week.

## 2022-07-08 NOTE — ED Provider Notes (Signed)
Emergency Department Provider Note   I have reviewed the triage vital signs and the nursing notes.   HISTORY  Chief Complaint Knee Pain (Right)   HPI [name] is a 56 y.o. female presents emergency department evaluation of right knee pain.  Patient states she was moving today and stepping up into a truck when she heard a "pop" and felt severe right knee pain.  Pain radiates down into her lower leg but seems localized to the right knee.  No pain in the hip.  No fall or head injury.  No numbness/weakness.  She is on chronic pain medication with buprenorphine patch.    Past Medical History:  Diagnosis Date   Allergy    seasonal   Anxiety    Arthritis    Cataract    bilateral   Chronic back pain    Degenerative spinal arthritis    Depression    "had in the past,no medications now"   Dercum's disease    Fibromyalgia     Review of Systems  Constitutional: No fever/chills Cardiovascular: Denies chest pain. Respiratory: Denies shortness of breath. Gastrointestinal: No abdominal pain. Musculoskeletal: Positive right knee pain.  Neurological: Negative for headaches, focal weakness or numbness.   ____________________________________________   PHYSICAL EXAM:  VITAL SIGNS: ED Triage Vitals  Enc Vitals Group     BP 07/08/22 1436 102/74     Pulse Rate 07/08/22 1436 91     Resp 07/08/22 1436 20     Temp 07/08/22 1436 99 F (37.2 C)     Temp Source 07/08/22 1436 Oral     SpO2 07/08/22 1436 99 %     Weight 07/08/22 1433 224 lb (101.6 kg)     Height 07/08/22 1433 '5\' 3"'$  (1.6 m)   Constitutional: Alert and oriented. Well appearing and in no acute distress. Eyes: Conjunctivae are normal.  Head: Atraumatic. Nose: No congestion/rhinnorhea. Mouth/Throat: Mucous membranes are moist.  Neck: No stridor.  Cardiovascular: Good peripheral circulation. Respiratory: Normal respiratory effort. Gastrointestinal: No distention.  Musculoskeletal: Mild right knee  effusion without laceration.  No significant joint laxity.  No tenderness to the right ankle.  Preserved range of motion of the right hip.  Anterior compartments to the right lower extremity are soft.  Neurologic:  Normal speech and language. No gross focal neurologic deficits are appreciated.  Skin:  Skin is warm, dry and intact. No rash noted.   ____________________________________________  RADIOLOGY  DG Knee Complete 4 Views Right  Result Date: 07/08/2022 CLINICAL DATA:  Acute RIGHT knee pain today.  Initial encounter. EXAM: RIGHT KNEE - COMPLETE 4 VIEW COMPARISON:  08/18/2014 radiographs FINDINGS: There is no evidence of acute fracture, dislocation or joint effusion. Mild tricompartmental joint space narrowing/osteophytosis noted. No focal bony lesions are identified. IMPRESSION: 1. No acute abnormality. 2. Mild tricompartmental degenerative changes. Electronically Signed   By: Margarette Canada M.D.   On: 07/08/2022 15:20    ____________________________________________   PROCEDURES  Procedure(s) performed:   Procedures  None  ____________________________________________   INITIAL IMPRESSION / ASSESSMENT AND PLAN / ED COURSE  Pertinent labs & imaging results that were available during my care of the patient were reviewed by me and considered in my medical decision making (see chart for details).   This patient is Presenting for Evaluation of knee pain, which does require a range of treatment options, and is a complaint that involves a moderate risk of morbidity and mortality.  The Differential Diagnoses include fracture, dislocation, ligament injury, contusion,  etc.  Critical Interventions-    Medications  oxyCODONE-acetaminophen (PERCOCET/ROXICET) 5-325 MG per tablet 1 tablet (1 tablet Oral Given 07/08/22 1445)  HYDROcodone-acetaminophen (NORCO/VICODIN) 5-325 MG per tablet 1 tablet (1 tablet Oral Given 07/08/22 1641)     Radiologic Tests Ordered, included right knee x-ray. I  independently interpreted the images and agree with radiology interpretation.    Medical Decision Making: Summary:  Patient presents emergency department for evaluation of right knee pain.  X-ray shows no acute bony abnormality but suspicion increased for possible ligamentous or meniscus injury.  She has established up with orthopedics for an unrelated issue.  She will call for close follow-up.  Plan for knee brace, crutches, RICE.  I did prescribe additional pain medication for her after review of the PDMP.    Disposition: discharge  ____________________________________________  FINAL CLINICAL IMPRESSION(S) / ED DIAGNOSES  Final diagnoses:  Acute pain of right knee    NEW OUTPATIENT MEDICATIONS STARTED DURING THIS VISIT:  Discharge Medication List as of 07/08/2022  4:31 PM     START taking these medications   Details  HYDROcodone-acetaminophen (NORCO/VICODIN) 5-325 MG tablet Take 2 tablets by mouth every 4 (four) hours as needed., Starting Sun 07/08/2022, Normal    senna-docusate (SENOKOT-S) 8.6-50 MG tablet Take 1 tablet by mouth at bedtime as needed for mild constipation., Starting Sun 07/08/2022, Normal        Note:  This document was prepared using Dragon voice recognition software and may include unintentional dictation errors.  Nanda Quinton, MD, [facility] Emergency Medicine    Jaria Conway, Wonda Olds, MD 07/08/22 [phone]

## 2022-07-09 ENCOUNTER — Ambulatory Visit: Payer: Medicare Other | Admitting: Family Medicine

## 2022-07-10 DIAGNOSIS — M25561 Pain in right knee: Secondary | ICD-10-CM | POA: Diagnosis not present

## 2022-07-10 DIAGNOSIS — M25512 Pain in left shoulder: Secondary | ICD-10-CM | POA: Diagnosis not present

## 2022-07-11 DIAGNOSIS — M25561 Pain in right knee: Secondary | ICD-10-CM | POA: Diagnosis not present

## 2022-07-13 ENCOUNTER — Ambulatory Visit (INDEPENDENT_AMBULATORY_CARE_PROVIDER_SITE_OTHER): Payer: Medicare Other | Admitting: Family Medicine

## 2022-07-13 ENCOUNTER — Encounter: Payer: Self-pay | Admitting: Family Medicine

## 2022-07-13 ENCOUNTER — Telehealth: Payer: Self-pay | Admitting: *Deleted

## 2022-07-13 ENCOUNTER — Telehealth: Payer: Self-pay

## 2022-07-13 VITALS — BP 120/80 | HR 86 | Temp 98.9°F | Resp 12 | Ht 63.0 in | Wt 216.0 lb

## 2022-07-13 DIAGNOSIS — M25561 Pain in right knee: Secondary | ICD-10-CM | POA: Diagnosis not present

## 2022-07-13 DIAGNOSIS — E559 Vitamin D deficiency, unspecified: Secondary | ICD-10-CM | POA: Diagnosis not present

## 2022-07-13 DIAGNOSIS — Z23 Encounter for immunization: Secondary | ICD-10-CM | POA: Diagnosis not present

## 2022-07-13 DIAGNOSIS — R7303 Prediabetes: Secondary | ICD-10-CM

## 2022-07-13 LAB — BASIC METABOLIC PANEL
BUN: 16 mg/dL (ref 6–23)
CO2: 27 mEq/L (ref 19–32)
Calcium: 9.7 mg/dL (ref 8.4–10.5)
Chloride: 105 mEq/L (ref 96–112)
Creatinine, Ser: 0.73 mg/dL (ref 0.40–1.20)
GFR: 90.45 mL/min (ref >60.00)
Glucose, Bld: 118 mg/dL — ABNORMAL HIGH (ref 70–99)
Potassium: 4.8 mEq/L (ref 3.5–5.1)
Sodium: 140 mEq/L (ref 135–145)

## 2022-07-13 LAB — VITAMIN D 25 HYDROXY (VIT D DEFICIENCY, FRACTURES): VITD: 29.74 ng/mL — ABNORMAL LOW (ref 30.00–100.00)

## 2022-07-13 LAB — HEMOGLOBIN A1C: Hgb A1c MFr Bld: 6.1 % (ref 4.6–6.5)

## 2022-07-13 NOTE — Patient Instructions (Addendum)
A few things to remember from today's visit:  Prediabetes - Plan: Hemoglobin Y8L, Basic metabolic panel  Acute pain of right knee  Vitamin D deficiency - Plan: VITAMIN D 25 Hydroxy (Vit-D Deficiency, Fractures)  Your surgeon can refer you to a skill nurse facility after surgery, if appropriate,and with PT orders. Today you got your flu vaccine and we are checking vit D and sugar. Smoking cessation is very important specially before having surgery.  Do not use My Chart to request refills or for acute issues that need immediate attention. If you send a my chart message, it may take a few days to be addressed, specially if I am not in the office.  Please be sure medication list is accurate. If a new problem present, please set up appointment sooner than planned today.

## 2022-07-13 NOTE — Telephone Encounter (Signed)
     Patient  visit on 10/8  at [facility] you been able to follow up with your primary care physician? yes  The patient was or was not able to obtain any needed medicine or equipment. yes  Are there diet recommendations that you are having difficulty following? na Patient expresses understanding of discharge instructions and education provided has no other needs at this time. yes     [place], Care Management  [phone] [address], [place], [place] [address] Phone: [phone] Email: [email or web address]

## 2022-07-13 NOTE — Progress Notes (Unsigned)
ACUTE VISIT Chief Complaint  Patient presents with   Follow-up   HPI: [name] is a 56 y.o. female with medical history of significant for chronic pain, fibromyalgia, hyperlipidemia, vitamin D deficiency, anxiety, and depression here today with her daughter for follow up. She was last seen on 01/26/22 for acute visit. Today she is requesting referral for inpt rehab. States that she is having right knee surgery, meniscal repair, she lives alone and cannot stay home after surgery. Evaluated in the ED on 07/08/2022 for acute right knee pain, sudden onset when she was getting into a truck, heard a "pop." Severe pain, limitation or ROM. No erythema or edema. Right knee x-ray done: No acute abnormality, mild tricompartmental degenerative changes. Reports having right knee MRI done.  Prediabetes: Negative for abdominal pain, nausea,vomiting, polydipsia,polyuria, or polyphagia. Lab Results  Component Value Date   HGBA1C 6.1 11/08/2021   Vit D deficiency: She is on a daily multivitamin with vit D. Last 25 OH vit D 23.2 in 11/2021.  Review of Systems  Constitutional:  Positive for fatigue. Negative for activity change, appetite change and fever.  HENT:  Negative for mouth sores, nosebleeds and sore throat.   Respiratory:  Negative for cough, shortness of breath and wheezing.   Cardiovascular:  Negative for chest pain, palpitations and leg swelling.  Genitourinary:  Negative for decreased urine volume, dysuria and hematuria.  Musculoskeletal:  Positive for arthralgias, back pain, gait problem and myalgias.  Neurological:  Negative for syncope, weakness and headaches.  Psychiatric/Behavioral:  Negative for confusion. The patient is nervous/anxious.   Rest see pertinent positives and negatives per HPI.  Current Outpatient Medications on File Prior to Visit  Medication Sig Dispense Refill   acetaminophen (TYLENOL) 500 MG tablet Take 1,000 mg by mouth every 6 (six) hours as needed  for mild pain or moderate pain.     Artificial Saliva (BIOTENE DRY MOUTH) GUM Use as directed 1 each in the mouth or throat daily as needed (Dry mouth).     buprenorphine (BUTRANS) 15 MCG/HR 1 patch once a week.     clotrimazole-betamethasone (LOTRISONE) cream APPLY TO AFFECTED AREA TWICE A DAY 30 g 0   diphenhydrAMINE (BENADRYL) 25 mg capsule Take 25 mg by mouth every 6 (six) hours as needed. (Patient not taking: Reported on 05/31/2022)     DULoxetine (CYMBALTA) 60 MG capsule Take 60 mg by mouth daily.     EUCALYPTUS OIL EX Apply 1 application  topically once a week.     fluticasone (FLONASE) 50 MCG/ACT nasal spray SPRAY 2 SPRAYS INTO EACH NOSTRIL EVERY DAY 16 mL 1   gabapentin (NEURONTIN) 600 MG tablet Take 1,200 mg by mouth 3 (three) times daily.     HYDROcodone-acetaminophen (NORCO/VICODIN) 5-325 MG tablet Take 2 tablets by mouth every 4 (four) hours as needed. 10 tablet 0   meloxicam (MOBIC) 15 MG tablet Take 15 mg by mouth daily.     OVER THE COUNTER MEDICATION Use as directed 1 application in the mouth or throat daily as needed (mouth sore).     senna-docusate (SENOKOT-S) 8.6-50 MG tablet Take 1 tablet by mouth at bedtime as needed for mild constipation. 20 tablet 0   traZODone (DESYREL) 100 MG tablet Take 100 mg by mouth at bedtime.     No current facility-administered medications on file prior to visit.   Past Medical History:  Diagnosis Date   Allergy    seasonal   Anxiety    Arthritis    Cataract  bilateral   Chronic back pain    Degenerative spinal arthritis    Depression    "had in the past,no medications now"   Dercum's disease    Fibromyalgia    Allergies  Allergen Reactions   Pneumovax 23 [Pneumococcal Vac Polyvalent] Other (See Comments)    Arm mass   Quetiapine Fumarate Nausea Only    Nausea, headache and lethargy   Seroquel [Quetiapine Fumarate] Nausea Only    Nausea, headache and lethargy   Social History   Socioeconomic History   Marital status:  Divorced    Spouse name: Not on file   Number of children: 3   Years of education: Not on file   Highest education level: Not on file  Occupational History   Not on file  Tobacco Use   Smoking status: Every Day    Packs/day: 0.50    Years: 23.00    Total pack years: 11.50    Types: Cigarettes    Start date: 20    Passive exposure: Current   Smokeless tobacco: Never  Vaping Use   Vaping Use: Never used  Substance and Sexual Activity   Alcohol use: No   Drug use: No   Sexual activity: Not Currently  Other Topics Concern   Not on file  Social History Narrative   Not on file   Social Determinants of Health   Financial Resource Strain: Low Risk  (02/12/2022)   Overall Financial Resource Strain (CARDIA)    Difficulty of Paying Living Expenses: Not hard at all  Food Insecurity: No Food Insecurity (02/12/2022)   Hunger Vital Sign    Worried About Running Out of Food in the Last Year: Never true    Ran Out of Food in the Last Year: Never true  Transportation Needs: No Transportation Needs (02/12/2022)   PRAPARE - Hydrologist (Medical): No    Lack of Transportation (Non-Medical): No  Physical Activity: Insufficiently Active (02/12/2022)   Exercise Vital Sign    Days of Exercise per Week: 4 days    Minutes of Exercise per Session: 30 min  Stress: Stress Concern Present (02/12/2022)   [place]    Feeling of Stress : To some extent  Social Connections: Moderately Isolated (02/07/2021)   Social Connection and Isolation Panel [NHANES]    Frequency of Communication with Friends and Family: More than three times a week    Frequency of Social Gatherings with Friends and Family: More than three times a week    Attends Religious Services: More than 4 times per year    Active Member of Clubs or Organizations: No    Attends Archivist Meetings: Never    Marital Status: Divorced    Vitals:   07/13/22 1151  BP: 120/80  Pulse: 86  Resp: 12  Temp: 98.9 F (37.2 C)  SpO2: 96%   Body mass index is 38.26 kg/m.  Physical Exam Vitals and nursing note reviewed.  Constitutional:      General: She is not in acute distress.    Appearance: She is well-developed.  HENT:     Head: Normocephalic and atraumatic.  Eyes:     Conjunctiva/sclera: Conjunctivae normal.  Cardiovascular:     Rate and Rhythm: Normal rate and regular rhythm.     Heart sounds: No murmur heard. Pulmonary:     Effort: Pulmonary effort is normal. No respiratory distress.     Breath sounds: Normal breath  sounds.  Abdominal:     Palpations: Abdomen is soft.     Tenderness: There is no abdominal tenderness.  Lymphadenopathy:     Cervical: No cervical adenopathy.  Skin:    General: Skin is warm.     Findings: No erythema or rash.  Neurological:     Mental Status: She is alert and oriented to person, place, and time.     Comments: Antalgic gait, assisted with crutches.  Psychiatric:        Mood and Affect: Mood is anxious.   ASSESSMENT AND PLAN:  Mr.Shyasia was seen today for follow-up.  Diagnoses and all orders for this visit: Orders Placed This Encounter  Procedures   Flu Vaccine QUAD 5moIM (Fluarix, Fluzone & Alfiuria Quad PF)   Hemoglobin A1c   VITAMIN D 25 Hydroxy (Vit-D Deficiency, Fractures)   Basic metabolic panel   Lab Results  Component Value Date   HGBA1C 6.1 07/13/2022   Lab Results  Component Value Date   CREATININE 0.73 07/13/2022   BUN 16 07/13/2022   NA 140 07/13/2022   K 4.8 07/13/2022   CL 105 07/13/2022   CO2 27 07/13/2022   Prediabetes Encouraged a healthy life style for diabetes prevention. Last HgA1C 6.1. Further recommendations according to HgA1C result.  Acute pain of right knee Reporting meniscal tear seen on right knee MRI and planning on arthroscopic surgery. She is not sure if she is going to need preop labs.  Recommend discussing her  concerned about post surgical rehab, SNF is an option but needs to be arranged by surgeon. On chronic opioid treatment. Smoking cessation encouraged.  Vitamin D deficiency Problem has not been well controlled. Continue current dose of vit D supplementation. Further recommendations according to 25 OH vit D result.  Need for influenza vaccination -     Flu Vaccine QUAD 61moM (Fluarix, Fluzone & Alfiuria Quad PF)  Return if symptoms worsen or fail to improve.  Alice Burnside G. JoMartiniqueMD  [facility].

## 2022-07-13 NOTE — Chronic Care Management (AMB) (Signed)
  Care Coordination   Note   07/13/2022 Name: [name] MRN: [id number] DOB: 1966/03/28  [name] is a 56 y.o. year old female who sees [name], [name], MD for primary care. I reached out to [employer] by phone today to offer care coordination services.  [name] was given information about Care Coordination services today including:   The Care Coordination services include support from the care team which includes your Nurse Coordinator, Clinical Social Worker, or Pharmacist.  The Care Coordination team is here to help remove barriers to the health concerns and goals most important to you. Care Coordination services are voluntary, and the patient may decline or stop services at any time by request to their care team member.   Care Coordination Consent Status: Patient agreed to services and verbal consent obtained.   Follow up plan:  Telephone appointment with care coordination team member scheduled for:  07/17/22  Encounter Outcome:  Pt. Scheduled

## 2022-07-17 ENCOUNTER — Ambulatory Visit: Payer: Self-pay

## 2022-07-17 NOTE — Patient Instructions (Signed)
Visit Information  Thank you for taking time to visit with me today. Please don't hesitate to contact me if I can be of assistance to you.   Following are the goals we discussed today:   Goals Addressed             This Visit's Progress    COMPLETED: Care Coordination Activities       Care Coordination Interventions: [facility] screening completed - no acute resource needs identified at this time. The patient and her daughter live together. Daughter is able to assist with transportation and care needs while she is not working Discussed the patient has an appointment on 10/24 with her surgeon to discuss plans to repair a torn meniscus Determined the patient would like to identify a short-term rehab facility to enter into prior to surgery and return to post surgery for PT Reviewed with the patient that in order to enter into a rehab she would need PT notes indicating there is a need and it is uncommon to enter rehab until post surgery Education provided that not all surgeries lead to inpatient rehab days and it would be up to PT recommendations as well as her health plan Patient reports she has already contacted her health plan and was advised she is covered for 21 days of SNF stay for in patient rehab Reviewed with the patient that this is a Medicare benefit but does not guarantee that this procedure will qualify for rehab  Discussed plans for the patient to follow up with her surgeon regarding concern with rehab placement Encouraged the patient to contact the care guide team if transportation is needed for future medical appointments, provided patient with care guide contact number         If you are experiencing a Mental Health or [place] or need someone to talk to, please go to [facility] [address], [place] [phone])  The patient verbalized understanding of instructions, educational materials, and care plan provided today  and DECLINED offer to receive copy of patient instructions, educational materials, and care plan.   No further follow up required: Please contact your surgeon regarding concern for rehab placement due to torn meniscus  Daneen Schick, BSW, CDP Social Worker, Certified Dementia Practitioner [facility]  Care Coordination [phone]

## 2022-07-17 NOTE — Patient Outreach (Signed)
  Care Coordination   Initial Visit Note   07/17/2022 Name: [name] MRN: [id number] DOB: 25-May-1966  [name] is a 56 y.o. year old female who sees [name], [name], MD for primary care. I spoke with  [name] by phone today.  What matters to the patients health and wellness today?  To be placed into rehab for torn meniscus     Goals Addressed             This Visit's Progress    COMPLETED: Care Coordination Activities       Care Coordination Interventions: [facility] screening completed - no acute resource needs identified at this time. The patient and her daughter live together. Daughter is able to assist with transportation and care needs while she is not working Discussed the patient has an appointment on 10/24 with her surgeon to discuss plans to repair a torn meniscus Determined the patient would like to identify a short-term rehab facility to enter into prior to surgery and return to post surgery for PT Reviewed with the patient that in order to enter into a rehab she would need PT notes indicating there is a need and it is uncommon to enter rehab until post surgery Education provided that not all surgeries lead to inpatient rehab days and it would be up to PT recommendations as well as her health plan Patient reports she has already contacted her health plan and was advised she is covered for 21 days of SNF stay for in patient rehab Reviewed with the patient that this is a Medicare benefit but does not guarantee that this procedure will qualify for rehab  Discussed plans for the patient to follow up with her surgeon regarding concern with rehab placement Encouraged the patient to contact the care guide team if transportation is needed for future medical appointments, provided patient with care guide contact number         SDOH assessments and interventions completed:  Yes  SDOH Interventions Today    Flowsheet Row Most Recent Value   SDOH Interventions   Food Insecurity Interventions Intervention Not Indicated  Housing Interventions Intervention Not Indicated  Transportation Interventions Intervention Not Indicated        Care Coordination Interventions Activated:  Yes  Care Coordination Interventions:  Yes, provided   Follow up plan: No further intervention required.   Encounter Outcome:  Pt. Visit Completed   Daneen Schick, BSW, CDP Social Worker, Certified Dementia Practitioner [place] [facility]  Care Coordination [phone]

## 2022-07-24 DIAGNOSIS — S83241A Other tear of medial meniscus, current injury, right knee, initial encounter: Secondary | ICD-10-CM | POA: Diagnosis not present

## 2022-07-24 DIAGNOSIS — S83281S Other tear of lateral meniscus, current injury, right knee, sequela: Secondary | ICD-10-CM | POA: Diagnosis not present

## 2022-07-24 DIAGNOSIS — M25561 Pain in right knee: Secondary | ICD-10-CM | POA: Diagnosis not present

## 2022-08-03 NOTE — Patient Instructions (Signed)
DUE TO COVID-19 ONLY TWO VISITORS  (aged 58 and older)  ARE ALLOWED TO COME WITH YOU AND STAY IN THE WAITING ROOM ONLY DURING PRE OP AND PROCEDURE.   **NO VISITORS ARE ALLOWED IN THE SHORT STAY AREA OR RECOVERY ROOM!!**  IF YOU WILL BE ADMITTED INTO THE HOSPITAL YOU ARE ALLOWED ONLY FOUR SUPPORT PEOPLE DURING VISITATION HOURS ONLY (7 AM -8PM)   The support person(s) must pass our screening, gel in and out, and wear a mask at all times, including in the patient's room. Patients must also wear a mask when staff or their support person are in the room. Visitors GUEST BADGE MUST BE WORN VISIBLY  One adult visitor may remain with you overnight and MUST be in the room by 8 P.M.     Your procedure is scheduled on: 08/10/22   Report to [facility] Main Entrance    Report to admitting at 12:30 PM   Call this number if you have problems the morning of surgery [phone]   Do not eat food :After Midnight.   After Midnight you may have the following liquids until _11:45_____ AM/  DAY OF SURGERY  Water Black Coffee (sugar ok, NO MILK/CREAM OR CREAMERS)  Tea (sugar ok, NO MILK/CREAM OR CREAMERS) regular and decaf                             Plain Jell-O (NO RED)                                           Fruit ices (not with fruit pulp, NO RED)                                     Popsicles (NO RED)                                                                  Juice: apple, WHITE grape, WHITE cranberry Sports drinks like Gatorade (NO RED)                   The day of surgery:  Drink ONE (1) Pre-Surgery Clear Ensure  at 11:30  AM the morning of surgery. Drink in one sitting. Do not sip.  This drink was given to you during your hospital  pre-op appointment visit. Nothing else to drink after completing the  Pre-Surgery Clear Ensure at 11:45 AM.          If you have questions, please contact your surgeon's office.      Oral Hygiene is also important to reduce your risk of  infection.                                    Remember - BRUSH YOUR TEETH THE MORNING OF SURGERY WITH YOUR REGULAR TOOTHPASTE      Take these medicines the morning of surgery with A SIP OF WATER: Gabapentin   Before surgery.Stop taking __ASA 81_________on  __________as instructed by _____________.  Stop taking ____________as directed by your Surgeon/Cardiologist.  Contact your Surgeon/Cardiologist for instructions on Anticoagulant Therapy prior to surgery.                                You may not have any metal on your body including hair pins, jewelry, and body piercing             Do not wear make-up, lotions, powders, perfumes/cologne, or deodorant  Do not wear nail polish including gel and S&S, artificial/acrylic nails, or any other type of covering on natural nails including finger and toenails. If you have artificial nails, gel coating, etc. that needs to be removed by a nail salon please have this removed prior to surgery or surgery may need to be canceled/ delayed if the surgeon/ anesthesia feels like they are unable to be safely monitored.   Do not shave  48 hours prior to surgery.    Do not bring valuables to the hospital. [place].   Contacts, dentures or bridgework may not be worn into surgery.   Bring small overnight bag day of surgery.   DO NOT [place]. PHARMACY WILL DISPENSE MEDICATIONS LISTED ON YOUR MEDICATION LIST TO YOU DURING YOUR ADMISSION [place]!       Special Instructions: Bring a copy of your healthcare power of attorney and living will documents  the day of surgery if you haven't scanned them before.              Please read over the following fact sheets you were given: IF YOU HAVE QUESTIONS ABOUT YOUR PRE-OP INSTRUCTIONS PLEASE CALL [phone]    [facility] - Preparing for Surgery Before surgery, you can play an important role.  Because skin is not  sterile, your skin needs to be as free of germs as possible.  You can reduce the number of germs on your skin by washing with CHG (chlorahexidine gluconate) soap before surgery.  CHG is an antiseptic cleaner which kills germs and bonds with the skin to continue killing germs even after washing. Please DO NOT use if you have an allergy to CHG or antibacterial soaps.  If your skin becomes reddened/irritated stop using the CHG and inform your nurse when you arrive at Short Stay. Do not shave (including legs and underarms) for at least 48 hours prior to the first CHG shower.   Please follow these instructions carefully:  1.  Shower with CHG Soap the night before surgery and the  morning of Surgery.  2.  If you choose to wash your hair, wash your hair first as usual with your  normal  shampoo.  3.  After you shampoo, rinse your hair and body thoroughly to remove the  shampoo.                            4.  Use CHG as you would any other liquid soap.  You can apply chg directly  to the skin and wash                       Gently with a scrungie or clean washcloth.  5.  Apply the CHG Soap to your body ONLY  FROM THE NECK DOWN.   Do not use on face/ open                           Wound or open sores. Avoid contact with eyes, ears mouth and genitals (private parts).                       Wash face,  Genitals (private parts) with your normal soap.             6.  Wash thoroughly, paying special attention to the area where your surgery  will be performed.  7.  Thoroughly rinse your body with warm water from the neck down.  8.  DO NOT shower/wash with your normal soap after using and rinsing off  the CHG Soap.                9.  Pat yourself dry with a clean towel.            10.  Wear clean pajamas.            11.  Place clean sheets on your bed the night of your first shower and do not  sleep with pets. Day of Surgery : Do not apply any lotions/deodorants the morning of surgery.  Please wear clean clothes to  the hospital/surgery center.  FAILURE TO FOLLOW THESE INSTRUCTIONS MAY RESULT IN THE CANCELLATION OF YOUR SURGERY  ________________________________________________________________________  Incentive Spirometer  An incentive spirometer is a tool that can help keep your lungs clear and active. This tool measures how well you are filling your lungs with each breath. Taking long deep breaths may help reverse or decrease the chance of developing breathing (pulmonary) problems (especially infection) following: A long period of time when you are unable to move or be active. BEFORE THE PROCEDURE  If the spirometer includes an indicator to show your best effort, your nurse or respiratory therapist will set it to a desired goal. If possible, sit up straight or lean slightly forward. Try not to slouch. Hold the incentive spirometer in an upright position. INSTRUCTIONS FOR USE  Sit on the edge of your bed if possible, or sit up as far as you can in bed or on a chair. Hold the incentive spirometer in an upright position. Breathe out normally. Place the mouthpiece in your mouth and seal your lips tightly around it. Breathe in slowly and as deeply as possible, raising the piston or the ball toward the top of the column. Hold your breath for 3-5 seconds or for as long as possible. Allow the piston or ball to fall to the bottom of the column. Remove the mouthpiece from your mouth and breathe out normally. Rest for a few seconds and repeat Steps 1 through 7 at least 10 times every 1-2 hours when you are awake. Take your time and take a few normal breaths between deep breaths. The spirometer may include an indicator to show your best effort. Use the indicator as a goal to work toward during each repetition. After each set of 10 deep breaths, practice coughing to be sure your lungs are clear. If you have an incision (the cut made at the time of surgery), support your incision when coughing by placing a pillow or  rolled up towels firmly against it. Once you are able to get out of bed, walk around indoors and cough well. You may stop using the incentive  spirometer when instructed by your caregiver.  RISKS AND COMPLICATIONS Take your time so you do not get dizzy or light-headed. If you are in pain, you may need to take or ask for pain medication before doing incentive spirometry. It is harder to take a deep breath if you are having pain. AFTER USE Rest and breathe slowly and easily. It can be helpful to keep track of a log of your progress. Your caregiver can provide you with a simple table to help with this. If you are using the spirometer at home, follow these instructions: [place] IF:  You are having difficultly using the spirometer. You have trouble using the spirometer as often as instructed. Your pain medication is not giving enough relief while using the spirometer. You develop fever of 100.5 F (38.1 C) or higher. SEEK IMMEDIATE MEDICAL CARE IF:  You cough up bloody sputum that had not been present before. You develop fever of 102 F (38.9 C) or greater. You develop worsening pain at or near the incision site. MAKE SURE YOU:  Understand these instructions. Will watch your condition. Will get help right away if you are not doing well or get worse. Document Released: 01/28/2007 Document Revised: 12/10/2011 Document Reviewed: 03/31/2007 [facility] Patient Information 2014 [place], [place].   ________________________________________________________________________

## 2022-08-06 ENCOUNTER — Encounter (HOSPITAL_COMMUNITY): Payer: Self-pay

## 2022-08-06 ENCOUNTER — Other Ambulatory Visit: Payer: Self-pay

## 2022-08-06 ENCOUNTER — Encounter (HOSPITAL_COMMUNITY)
Admission: RE | Admit: 2022-08-06 | Discharge: 2022-08-06 | Disposition: A | Discharge status: Home / Self Care | Payer: Medicare Other | Source: Ambulatory Visit | Attending: Orthopedic Surgery | Admitting: Orthopedic Surgery

## 2022-08-06 DIAGNOSIS — Z01818 Encounter for other preprocedural examination: Secondary | ICD-10-CM | POA: Diagnosis not present

## 2022-08-06 DIAGNOSIS — R7303 Prediabetes: Secondary | ICD-10-CM | POA: Insufficient documentation

## 2022-08-06 HISTORY — DX: Prediabetes: R73.03

## 2022-08-06 LAB — BASIC METABOLIC PANEL
Anion gap: 5 (ref 5–15)
BUN: 19 mg/dL (ref 6–20)
CO2: 28 mmol/L (ref 22–32)
Calcium: 9 mg/dL (ref 8.9–10.3)
Chloride: 107 mmol/L (ref 98–111)
Creatinine, Ser: 0.66 mg/dL (ref 0.44–1.00)
GFR, Estimated: >60 mL/min (ref >60)
Glucose, Bld: 129 mg/dL — ABNORMAL HIGH (ref 70–99)
Potassium: 4.9 mmol/L (ref 3.5–5.1)
Sodium: 140 mmol/L (ref 135–145)

## 2022-08-06 LAB — CBC
HCT: 41.2 % (ref 36.0–46.0)
Hemoglobin: 13.5 g/dL (ref 12.0–15.0)
MCH: 31.5 pg (ref 26.0–34.0)
MCHC: 32.8 g/dL (ref 30.0–36.0)
MCV: 96.3 fL (ref 80.0–100.0)
Platelets: 294 10*3/uL (ref 150–400)
RBC: 4.28 MIL/uL (ref 3.87–5.11)
RDW: 13 % (ref 11.5–15.5)
WBC: 10.4 10*3/uL (ref 4.0–10.5)
nRBC: 0 % (ref 0.0–0.2)

## 2022-08-06 NOTE — Progress Notes (Addendum)
Anesthesia note:  Bowel prep reminder:  NA  PCP - Dr. Claudean Severance Cardiologist -Dr. Virgina Jock Other-   Chest x-ray - no EKG - chart Stress Test - 2020 ECHO -  Cardiac Cath - no CABG-no Pacemaker/ICD device last checked:NA  Sleep Study - no CPAP -   Pt is pre diabetic-yes A1c was 6.1 on 07/13/22 CBG at PAT visit- Fasting Blood Sugar at home- Checks Blood Sugar _____  Blood Thinner:ASA '81mg'$  Blood Thinner Instructions: Aspirin Instructions:stop 7 days prior to DOS Last Dose:08/03/22  Anesthesia review: Yes  reason:Dercum's disease and chronic pain.  Patient denies shortness of breath, fever, cough and chest pain at PAT appointment Pt reports no SOB with activities.   She had back surgery with hardware that gives her pain as well as Dercum's and fibromyalgia. Her painful lipomas are on upper arms ,hips and abdomin.BP needs to be on lower arm.  Every Thursday she places a new Buprenorphine patch.  She has started smoking again and needs a nicotine patch .   Pt is anxious about her health because her Mother and Brother died near their 58 th birthday and hers is in January.   Patient verbalized understanding of instructions that were given to them at the PAT appointment. Patient was also instructed that they will need to review over the PAT instructions again at home before surgery.yes

## 2022-08-10 ENCOUNTER — Encounter (HOSPITAL_COMMUNITY)
Admission: RE | Disposition: A | Discharge status: Skilled Nursing Facility | Payer: Self-pay | Source: Home / Self Care | Attending: Orthopedic Surgery

## 2022-08-10 ENCOUNTER — Inpatient Hospital Stay (HOSPITAL_COMMUNITY): Payer: Medicare Other | Admitting: Anesthesiology

## 2022-08-10 ENCOUNTER — Inpatient Hospital Stay (HOSPITAL_COMMUNITY)
Admission: RE | Admit: 2022-08-10 | Discharge: 2022-08-13 | DRG: 489 | Disposition: A | Discharge status: Skilled Nursing Facility | Payer: Medicare Other | Attending: Orthopedic Surgery | Admitting: Orthopedic Surgery

## 2022-08-10 ENCOUNTER — Encounter (HOSPITAL_COMMUNITY): Payer: Self-pay | Admitting: Orthopedic Surgery

## 2022-08-10 ENCOUNTER — Inpatient Hospital Stay (HOSPITAL_COMMUNITY): Payer: Medicare Other | Admitting: Physician Assistant

## 2022-08-10 ENCOUNTER — Other Ambulatory Visit: Payer: Self-pay

## 2022-08-10 DIAGNOSIS — F419 Anxiety disorder, unspecified: Secondary | ICD-10-CM | POA: Diagnosis present

## 2022-08-10 DIAGNOSIS — Z791 Long term (current) use of non-steroidal anti-inflammatories (NSAID): Secondary | ICD-10-CM | POA: Diagnosis not present

## 2022-08-10 DIAGNOSIS — R2689 Other abnormalities of gait and mobility: Secondary | ICD-10-CM | POA: Diagnosis not present

## 2022-08-10 DIAGNOSIS — M6281 Muscle weakness (generalized): Secondary | ICD-10-CM | POA: Diagnosis not present

## 2022-08-10 DIAGNOSIS — Z8249 Family history of ischemic heart disease and other diseases of the circulatory system: Secondary | ICD-10-CM

## 2022-08-10 DIAGNOSIS — Z79899 Other long term (current) drug therapy: Secondary | ICD-10-CM | POA: Diagnosis not present

## 2022-08-10 DIAGNOSIS — S83281A Other tear of lateral meniscus, current injury, right knee, initial encounter: Secondary | ICD-10-CM | POA: Diagnosis not present

## 2022-08-10 DIAGNOSIS — S83241A Other tear of medial meniscus, current injury, right knee, initial encounter: Principal | ICD-10-CM | POA: Diagnosis present

## 2022-08-10 DIAGNOSIS — Z888 Allergy status to other drugs, medicaments and biological substances status: Secondary | ICD-10-CM | POA: Diagnosis not present

## 2022-08-10 DIAGNOSIS — R7303 Prediabetes: Principal | ICD-10-CM

## 2022-08-10 DIAGNOSIS — M1990 Unspecified osteoarthritis, unspecified site: Secondary | ICD-10-CM | POA: Diagnosis not present

## 2022-08-10 DIAGNOSIS — F32A Depression, unspecified: Secondary | ICD-10-CM | POA: Diagnosis present

## 2022-08-10 DIAGNOSIS — R531 Weakness: Secondary | ICD-10-CM | POA: Diagnosis not present

## 2022-08-10 DIAGNOSIS — Z7982 Long term (current) use of aspirin: Secondary | ICD-10-CM | POA: Diagnosis not present

## 2022-08-10 DIAGNOSIS — Z7401 Bed confinement status: Secondary | ICD-10-CM | POA: Diagnosis not present

## 2022-08-10 DIAGNOSIS — Z83719 Family history of colon polyps, unspecified: Secondary | ICD-10-CM

## 2022-08-10 DIAGNOSIS — Z743 Need for continuous supervision: Secondary | ICD-10-CM | POA: Diagnosis not present

## 2022-08-10 DIAGNOSIS — M659 Synovitis and tenosynovitis, unspecified: Secondary | ICD-10-CM | POA: Diagnosis present

## 2022-08-10 DIAGNOSIS — Z9889 Other specified postprocedural states: Secondary | ICD-10-CM

## 2022-08-10 DIAGNOSIS — E669 Obesity, unspecified: Secondary | ICD-10-CM | POA: Diagnosis present

## 2022-08-10 DIAGNOSIS — X58XXXA Exposure to other specified factors, initial encounter: Secondary | ICD-10-CM | POA: Diagnosis present

## 2022-08-10 DIAGNOSIS — M94261 Chondromalacia, right knee: Secondary | ICD-10-CM

## 2022-08-10 DIAGNOSIS — Z9049 Acquired absence of other specified parts of digestive tract: Secondary | ICD-10-CM | POA: Diagnosis not present

## 2022-08-10 DIAGNOSIS — S83241D Other tear of medial meniscus, current injury, right knee, subsequent encounter: Secondary | ICD-10-CM | POA: Diagnosis not present

## 2022-08-10 DIAGNOSIS — Z4789 Encounter for other orthopedic aftercare: Secondary | ICD-10-CM | POA: Diagnosis not present

## 2022-08-10 DIAGNOSIS — Z801 Family history of malignant neoplasm of trachea, bronchus and lung: Secondary | ICD-10-CM | POA: Diagnosis not present

## 2022-08-10 DIAGNOSIS — Z887 Allergy status to serum and vaccine status: Secondary | ICD-10-CM

## 2022-08-10 DIAGNOSIS — Z981 Arthrodesis status: Secondary | ICD-10-CM | POA: Diagnosis not present

## 2022-08-10 DIAGNOSIS — Z6837 Body mass index (BMI) 37.0-37.9, adult: Secondary | ICD-10-CM

## 2022-08-10 DIAGNOSIS — R2681 Unsteadiness on feet: Secondary | ICD-10-CM | POA: Diagnosis not present

## 2022-08-10 DIAGNOSIS — E559 Vitamin D deficiency, unspecified: Secondary | ICD-10-CM | POA: Diagnosis not present

## 2022-08-10 DIAGNOSIS — F17210 Nicotine dependence, cigarettes, uncomplicated: Secondary | ICD-10-CM | POA: Diagnosis present

## 2022-08-10 DIAGNOSIS — S83289A Other tear of lateral meniscus, current injury, unspecified knee, initial encounter: Secondary | ICD-10-CM | POA: Diagnosis present

## 2022-08-10 DIAGNOSIS — M797 Fibromyalgia: Secondary | ICD-10-CM | POA: Diagnosis not present

## 2022-08-10 HISTORY — PX: KNEE ARTHROSCOPY WITH MENISCAL REPAIR: SHX5653

## 2022-08-10 LAB — CBC
HCT: 41.6 % (ref 36.0–46.0)
Hemoglobin: 13.6 g/dL (ref 12.0–15.0)
MCH: 31.1 pg (ref 26.0–34.0)
MCHC: 32.7 g/dL (ref 30.0–36.0)
MCV: 95 fL (ref 80.0–100.0)
Platelets: 316 10*3/uL (ref 150–400)
RBC: 4.38 MIL/uL (ref 3.87–5.11)
RDW: 13.1 % (ref 11.5–15.5)
WBC: 10.6 10*3/uL — ABNORMAL HIGH (ref 4.0–10.5)
nRBC: 0 % (ref 0.0–0.2)

## 2022-08-10 LAB — CREATININE, SERUM
Creatinine, Ser: 0.81 mg/dL (ref 0.44–1.00)
GFR, Estimated: >60 mL/min (ref >60)

## 2022-08-10 SURGERY — ARTHROSCOPY, KNEE, WITH MENISCUS REPAIR
Anesthesia: General | Site: Knee | Laterality: Right

## 2022-08-10 MED ORDER — ENOXAPARIN SODIUM 40 MG/0.4ML IJ SOSY: 40.0000 mg | PREFILLED_SYRINGE | INTRAMUSCULAR | Status: DC

## 2022-08-10 MED ORDER — ONDANSETRON HCL 4 MG/2ML IJ SOLN
INTRAMUSCULAR | Status: DC | PRN
  Administered 2022-08-10: 4 mg via INTRAVENOUS

## 2022-08-10 MED ORDER — OXYCODONE HCL 5 MG PO TABS
5.0000 mg | ORAL_TABLET | Freq: Once | ORAL | Status: AC | PRN
  Administered 2022-08-10: 5 mg via ORAL

## 2022-08-10 MED ORDER — NICOTINE 21 MG/24HR TD PT24
21.0000 mg | MEDICATED_PATCH | Freq: Once | TRANSDERMAL | Status: DC
  Administered 2022-08-10: 21 mg via TRANSDERMAL
  Filled 2022-08-10: qty 1

## 2022-08-10 MED ORDER — HYDROMORPHONE HCL 1 MG/ML IJ SOLN
0.2500 mg | INTRAMUSCULAR | Status: DC | PRN
  Administered 2022-08-10 (×2): 0.5 mg via INTRAVENOUS

## 2022-08-10 MED ORDER — FENTANYL CITRATE (PF) 100 MCG/2ML IJ SOLN
INTRAMUSCULAR | Status: DC | PRN
  Administered 2022-08-10 (×2): 25 ug via INTRAVENOUS
  Administered 2022-08-10 (×2): 50 ug via INTRAVENOUS
  Administered 2022-08-10 (×2): 25 ug via INTRAVENOUS

## 2022-08-10 MED ORDER — FENTANYL CITRATE PF 50 MCG/ML IJ SOSY
50.0000 ug | PREFILLED_SYRINGE | INTRAMUSCULAR | Status: DC
  Administered 2022-08-10 (×2): 50 ug via INTRAVENOUS
  Filled 2022-08-10: qty 1
  Filled 2022-08-10: qty 2

## 2022-08-10 MED ORDER — AMISULPRIDE (ANTIEMETIC) 5 MG/2ML IV SOLN: 10.0000 mg | Freq: Once | INTRAVENOUS | Status: DC | PRN

## 2022-08-10 MED ORDER — DEXAMETHASONE SODIUM PHOSPHATE 10 MG/ML IJ SOLN
INTRAMUSCULAR | Status: AC
  Filled 2022-08-10: qty 1

## 2022-08-10 MED ORDER — HYDROMORPHONE HCL 1 MG/ML IJ SOLN
INTRAMUSCULAR | Status: AC
  Administered 2022-08-10: 0.5 mg via INTRAVENOUS
  Filled 2022-08-10: qty 1

## 2022-08-10 MED ORDER — ONDANSETRON HCL 4 MG PO TABS: 4.0000 mg | ORAL_TABLET | Freq: Four times a day (QID) | ORAL | Status: DC | PRN

## 2022-08-10 MED ORDER — PROPOFOL 10 MG/ML IV BOLUS
INTRAVENOUS | Status: DC | PRN
  Administered 2022-08-10: 180 mg via INTRAVENOUS

## 2022-08-10 MED ORDER — TRAZODONE HCL 100 MG PO TABS
100.0000 mg | ORAL_TABLET | Freq: Every day | ORAL | Status: DC
  Administered 2022-08-10 – 2022-08-12 (×3): 100 mg via ORAL
  Filled 2022-08-10 (×3): qty 1

## 2022-08-10 MED ORDER — ASPIRIN 81 MG PO TBEC
81.0000 mg | DELAYED_RELEASE_TABLET | Freq: Every day | ORAL | Status: DC
  Administered 2022-08-11 – 2022-08-13 (×3): 81 mg via ORAL
  Filled 2022-08-10 (×3): qty 1

## 2022-08-10 MED ORDER — LIDOCAINE HCL (PF) 2 % IJ SOLN
INTRAMUSCULAR | Status: AC
  Filled 2022-08-10: qty 5

## 2022-08-10 MED ORDER — OXYCODONE HCL 5 MG PO TABS
10.0000 mg | ORAL_TABLET | ORAL | Status: DC | PRN
  Administered 2022-08-10 – 2022-08-13 (×13): 15 mg via ORAL
  Filled 2022-08-10 (×14): qty 3

## 2022-08-10 MED ORDER — BUPIVACAINE-EPINEPHRINE (PF) 0.5% -1:200000 IJ SOLN
INTRAMUSCULAR | Status: DC | PRN
  Administered 2022-08-10: 30 mL

## 2022-08-10 MED ORDER — OXYCODONE HCL 5 MG PO TABS: 5.0000 mg | ORAL_TABLET | ORAL | Status: DC | PRN

## 2022-08-10 MED ORDER — EPHEDRINE SULFATE (PRESSORS) 50 MG/ML IJ SOLN
INTRAMUSCULAR | Status: DC | PRN
  Administered 2022-08-10: 10 mg via INTRAVENOUS
  Administered 2022-08-10 (×2): 5 mg via INTRAVENOUS

## 2022-08-10 MED ORDER — MIDAZOLAM HCL 2 MG/2ML IJ SOLN
INTRAMUSCULAR | Status: AC
  Filled 2022-08-10: qty 2

## 2022-08-10 MED ORDER — ACETAMINOPHEN 500 MG PO TABS: 1000.0000 mg | ORAL_TABLET | Freq: Four times a day (QID) | ORAL | Status: DC | PRN

## 2022-08-10 MED ORDER — OXYCODONE HCL 5 MG PO TABS
ORAL_TABLET | ORAL | Status: AC
  Filled 2022-08-10: qty 1

## 2022-08-10 MED ORDER — MIDAZOLAM HCL 2 MG/2ML IJ SOLN
1.0000 mg | INTRAMUSCULAR | Status: DC
  Filled 2022-08-10: qty 2

## 2022-08-10 MED ORDER — MIDAZOLAM HCL 5 MG/5ML IJ SOLN
INTRAMUSCULAR | Status: DC | PRN
  Administered 2022-08-10 (×2): 1 mg via INTRAVENOUS

## 2022-08-10 MED ORDER — SODIUM CHLORIDE 0.9 % IR SOLN
Status: DC | PRN
  Administered 2022-08-10: 1 mL

## 2022-08-10 MED ORDER — PROPOFOL 10 MG/ML IV BOLUS
INTRAVENOUS | Status: AC
  Filled 2022-08-10: qty 20

## 2022-08-10 MED ORDER — CEFAZOLIN SODIUM-DEXTROSE 2-4 GM/100ML-% IV SOLN
2.0000 g | INTRAVENOUS | Status: AC
  Administered 2022-08-10: 2 g via INTRAVENOUS
  Filled 2022-08-10: qty 100

## 2022-08-10 MED ORDER — METHOCARBAMOL 500 MG PO TABS
500.0000 mg | ORAL_TABLET | Freq: Four times a day (QID) | ORAL | Status: DC
  Administered 2022-08-10 – 2022-08-13 (×11): 500 mg via ORAL
  Filled 2022-08-10 (×11): qty 1

## 2022-08-10 MED ORDER — EPINEPHRINE PF 1 MG/ML IJ SOLN
INTRAMUSCULAR | Status: AC
  Filled 2022-08-10: qty 1

## 2022-08-10 MED ORDER — ONDANSETRON HCL 4 MG/2ML IJ SOLN: 4.0000 mg | Freq: Four times a day (QID) | INTRAMUSCULAR | Status: DC | PRN

## 2022-08-10 MED ORDER — ACETAMINOPHEN 325 MG PO TABS: 325.0000 mg | ORAL_TABLET | Freq: Four times a day (QID) | ORAL | Status: DC | PRN

## 2022-08-10 MED ORDER — OXYCODONE HCL 5 MG/5ML PO SOLN: 5.0000 mg | Freq: Once | ORAL | Status: AC | PRN

## 2022-08-10 MED ORDER — HYDROMORPHONE HCL 1 MG/ML IJ SOLN
0.5000 mg | INTRAMUSCULAR | Status: DC | PRN
  Administered 2022-08-11 – 2022-08-13 (×2): 1 mg via INTRAVENOUS
  Filled 2022-08-10 (×2): qty 1

## 2022-08-10 MED ORDER — BUPIVACAINE-EPINEPHRINE (PF) 0.5% -1:200000 IJ SOLN
INTRAMUSCULAR | Status: AC
  Filled 2022-08-10: qty 30

## 2022-08-10 MED ORDER — DULOXETINE HCL 60 MG PO CPEP
60.0000 mg | ORAL_CAPSULE | Freq: Every evening | ORAL | Status: DC
  Administered 2022-08-10 – 2022-08-12 (×3): 60 mg via ORAL
  Filled 2022-08-10 (×3): qty 1

## 2022-08-10 MED ORDER — ONDANSETRON HCL 4 MG/2ML IJ SOLN: 4.0000 mg | Freq: Once | INTRAMUSCULAR | Status: DC | PRN

## 2022-08-10 MED ORDER — CHLORHEXIDINE GLUCONATE 0.12 % MT SOLN
15.0000 mL | Freq: Once | OROMUCOSAL | Status: AC
  Administered 2022-08-10: 15 mL via OROMUCOSAL

## 2022-08-10 MED ORDER — LIDOCAINE HCL (CARDIAC) PF 100 MG/5ML IV SOSY
PREFILLED_SYRINGE | INTRAVENOUS | Status: DC | PRN
  Administered 2022-08-10: 50 mg via INTRAVENOUS
  Administered 2022-08-10: 10 mg via INTRAVENOUS

## 2022-08-10 MED ORDER — ACETAMINOPHEN 500 MG PO TABS
1000.0000 mg | ORAL_TABLET | Freq: Four times a day (QID) | ORAL | Status: AC
  Administered 2022-08-10 – 2022-08-11 (×4): 1000 mg via ORAL
  Filled 2022-08-10 (×4): qty 2

## 2022-08-10 MED ORDER — GABAPENTIN 400 MG PO CAPS
1200.0000 mg | ORAL_CAPSULE | Freq: Three times a day (TID) | ORAL | Status: DC
  Administered 2022-08-10 – 2022-08-13 (×8): 1200 mg via ORAL
  Filled 2022-08-10 (×8): qty 3

## 2022-08-10 MED ORDER — ADULT MULTIVITAMIN W/MINERALS CH
1.0000 | ORAL_TABLET | Freq: Every day | ORAL | Status: DC
  Administered 2022-08-10 – 2022-08-13 (×4): 1 via ORAL
  Filled 2022-08-10 (×4): qty 1

## 2022-08-10 MED ORDER — HYDROMORPHONE HCL 1 MG/ML IJ SOLN
0.5000 mg | INTRAMUSCULAR | Status: AC | PRN
  Administered 2022-08-10: 0.5 mg via INTRAVENOUS

## 2022-08-10 MED ORDER — ONDANSETRON HCL 4 MG/2ML IJ SOLN
INTRAMUSCULAR | Status: AC
  Filled 2022-08-10: qty 2

## 2022-08-10 MED ORDER — BUPIVACAINE HCL 0.25 % IJ SOLN
INTRAMUSCULAR | Status: AC
  Filled 2022-08-10: qty 1

## 2022-08-10 MED ORDER — GABAPENTIN 600 MG PO TABS
1200.0000 mg | ORAL_TABLET | Freq: Three times a day (TID) | ORAL | Status: DC
  Filled 2022-08-10: qty 2

## 2022-08-10 MED ORDER — DEXAMETHASONE SODIUM PHOSPHATE 10 MG/ML IJ SOLN
INTRAMUSCULAR | Status: DC | PRN
  Administered 2022-08-10: 8 mg via INTRAVENOUS

## 2022-08-10 MED ORDER — ENOXAPARIN SODIUM 40 MG/0.4ML IJ SOSY
40.0000 mg | PREFILLED_SYRINGE | INTRAMUSCULAR | Status: DC
  Administered 2022-08-11 – 2022-08-13 (×3): 40 mg via SUBCUTANEOUS
  Filled 2022-08-10 (×3): qty 0.4

## 2022-08-10 MED ORDER — DEXMEDETOMIDINE HCL IN NACL 80 MCG/20ML IV SOLN
INTRAVENOUS | Status: DC | PRN
  Administered 2022-08-10: 8 ug via BUCCAL

## 2022-08-10 MED ORDER — DOCUSATE SODIUM 100 MG PO CAPS
100.0000 mg | ORAL_CAPSULE | Freq: Every evening | ORAL | Status: DC
  Administered 2022-08-10 – 2022-08-12 (×3): 100 mg via ORAL
  Filled 2022-08-10 (×3): qty 1

## 2022-08-10 MED ORDER — ORAL CARE MOUTH RINSE: 15.0000 mL | Freq: Once | OROMUCOSAL | Status: AC

## 2022-08-10 MED ORDER — PHENYLEPHRINE HCL (PRESSORS) 10 MG/ML IV SOLN
INTRAVENOUS | Status: DC | PRN
  Administered 2022-08-10: 160 ug via INTRAVENOUS
  Administered 2022-08-10: 80 ug via INTRAVENOUS

## 2022-08-10 MED ORDER — LACTATED RINGERS IV SOLN: INTRAVENOUS | Status: DC

## 2022-08-10 MED ORDER — FENTANYL CITRATE (PF) 100 MCG/2ML IJ SOLN
INTRAMUSCULAR | Status: AC
  Filled 2022-08-10: qty 2

## 2022-08-10 MED ORDER — MELOXICAM 15 MG PO TABS
15.0000 mg | ORAL_TABLET | Freq: Every day | ORAL | Status: DC
  Administered 2022-08-11 – 2022-08-13 (×3): 15 mg via ORAL
  Filled 2022-08-10 (×3): qty 1

## 2022-08-10 SURGICAL SUPPLY — 38 items
BANDAGE ESMARK 6X9 LF (GAUZE/BANDAGES/DRESSINGS) IMPLANT
BLADE SHAVER TORPEDO 4X13 (MISCELLANEOUS) IMPLANT
BNDG CMPR 9X6 STRL LF SNTH (GAUZE/BANDAGES/DRESSINGS)
BNDG CMPR MED 10X6 ELC LF (GAUZE/BANDAGES/DRESSINGS) ×1
BNDG ELASTIC 6X10 VLCR STRL LF (GAUZE/BANDAGES/DRESSINGS) IMPLANT
BNDG ELASTIC 6X5.8 VLCR STR LF (GAUZE/BANDAGES/DRESSINGS) ×2 IMPLANT
BNDG ESMARK 6X9 LF (GAUZE/BANDAGES/DRESSINGS)
CLSR STERI-STRIP ANTIMIC 1/2X4 (GAUZE/BANDAGES/DRESSINGS) IMPLANT
CUFF TOURN SGL QUICK 34 (TOURNIQUET CUFF) ×1
CUFF TRNQT CYL 34X4.125X (TOURNIQUET CUFF) IMPLANT
DRAPE ARTHROSCOPY W/POUCH 114 (DRAPES) ×2 IMPLANT
DRAPE U-SHAPE 47X51 STRL (DRAPES) ×2 IMPLANT
DURAPREP 26ML APPLICATOR (WOUND CARE) ×2 IMPLANT
GAUZE 4X4 16PLY ~~LOC~~+RFID DBL (SPONGE) ×2 IMPLANT
GAUZE PAD ABD 8X10 STRL (GAUZE/BANDAGES/DRESSINGS) ×2 IMPLANT
GAUZE SPONGE 4X4 12PLY STRL (GAUZE/BANDAGES/DRESSINGS) ×2 IMPLANT
GAUZE XEROFORM 1X8 LF (GAUZE/BANDAGES/DRESSINGS) IMPLANT
GLOVE BIO SURGEON STRL SZ7.5 (GLOVE) ×4 IMPLANT
GLOVE BIOGEL PI IND STRL 8 (GLOVE) ×4 IMPLANT
GOWN STRL REUS W/ TWL XL LVL3 (GOWN DISPOSABLE) ×4 IMPLANT
GOWN STRL REUS W/TWL XL LVL3 (GOWN DISPOSABLE) ×2
IV NS IRRIG 3000ML ARTHROMATIC (IV SOLUTION) ×4 IMPLANT
KIT BASIN OR (CUSTOM PROCEDURE TRAY) ×2 IMPLANT
KIT SUTLOC MENISCAL ROOT REP (Anchor) IMPLANT
KIT TURNOVER KIT A (KITS) ×2 IMPLANT
MANIFOLD NEPTUNE II (INSTRUMENTS) ×2 IMPLANT
PACK ARTHROSCOPY DSU (CUSTOM PROCEDURE TRAY) ×2 IMPLANT
PAD ARMBOARD 7.5X6 YLW CONV (MISCELLANEOUS) IMPLANT
PORT APPOLLO RF 90DEGREE MULTI (SURGICAL WAND) IMPLANT
STRIP CLOSURE SKIN 1/2X4 (GAUZE/BANDAGES/DRESSINGS) IMPLANT
SUT ETHILON 3 0 PS 1 (SUTURE) IMPLANT
SUT MNCRL AB 3-0 PS2 18 (SUTURE) IMPLANT
SUT MNCRL AB 3-0 PS2 27 (SUTURE) IMPLANT
TOWEL OR 17X26 10 PK STRL BLUE (TOWEL DISPOSABLE) ×2 IMPLANT
TUBING ARTHROSCOPY IRRIG 16FT (MISCELLANEOUS) ×2 IMPLANT
TUBING CONNECTING 10 (TUBING) ×2 IMPLANT
WAND APOLLORF SJ50 AR-9845 (SURGICAL WAND) IMPLANT
WATER STERILE IRR 500ML POUR (IV SOLUTION) ×2 IMPLANT

## 2022-08-10 NOTE — Brief Op Note (Signed)
08/10/2022  3:04 PM  PATIENT:  [name]  56 y.o. female  PRE-OPERATIVE DIAGNOSIS:  Right knee medial root tear  POST-OPERATIVE DIAGNOSIS:  Right knee medial root tear  PROCEDURE:  Procedure(s) with comments: KNEE ARTHROSCOPY WITH ROOT MEDIAL MENISCAL REPAIR (Right) - 90  SURGEON:  Surgeon(s) and Role:    * Stann Mainland, Elly Modena, MD - Primary  PHYSICIAN ASSISTANT: Jonelle Sidle, PA-C  ANESTHESIA:   local and general  EBL:  15 cc  BLOOD ADMINISTERED:none  DRAINS: none   LOCAL MEDICATIONS USED:  MARCAINE     SPECIMEN:  No Specimen  DISPOSITION OF SPECIMEN:  N/A  COUNTS:  YES  TOURNIQUET:  * Missing tourniquet times found for documented tourniquets in log: [id number] *  DICTATION: .Note written in EPIC  PLAN OF CARE: Admit to inpatient   PATIENT DISPOSITION:  PACU - hemodynamically stable.   Delay start of Pharmacological VTE agent (>24hrs) due to surgical blood loss or risk of bleeding: not applicable

## 2022-08-10 NOTE — Anesthesia Postprocedure Evaluation (Signed)
Anesthesia Post Note  Patient: Air cabin crew  Procedure(s) Performed: KNEE ARTHROSCOPY WITH ROOT MEDIAL MENISCAL REPAIR (Right: Knee)     Patient location during evaluation: PACU Anesthesia Type: General Level of consciousness: awake and alert and oriented Pain management: pain level controlled Vital Signs Assessment: post-procedure vital signs reviewed and stable Respiratory status: spontaneous breathing, nonlabored ventilation and respiratory function stable Cardiovascular status: blood pressure returned to baseline and stable Postop Assessment: no apparent nausea or vomiting Anesthetic complications: no   No notable events documented.  Last Vitals:  Vitals:   08/10/22 1519 08/10/22 1545  BP: 114/74 120/88  Pulse: 85 78  Resp: (!) 9 (!) 7  Temp: 36.6 C   SpO2: 100% 99%    Last Pain:  Vitals:   08/10/22 1553  TempSrc:   PainSc: 8                  Eisen Robenson A.

## 2022-08-10 NOTE — Op Note (Signed)
Surgery Date: 08/10/2022   Surgeon(s): Nicholes Stairs, MD   ANESTHESIA:  general, and local   Assistant:  Jonelle Sidle, PA-C   Assistant attestation:   PA McClung present for the entire procedure.   FLUIDS: Per anesthesia record.    Implants:   Arthrex suture lock meniscal root repair device   ESTIMATED BLOOD LOSS: minimal   PREOPERATIVE DIAGNOSES:  1.  Right knee healed root meniscus tear 2.  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  Right knee mid body lateral meniscus tear, initial encounter.  POSTOPERATIVE DIAGNOSES:  same   PROCEDURES PERFORMED:  1.  Right knee arthroscopy with limited synovectomy 2.  Right knee arthroscopic medial meniscus root repair 3.  Right knee arthroscopy with arthroscopic chondroplasty medial femoral condyle and trochlea 4.  Right knee arthroscopic partial lateral meniscectomy   DESCRIPTION OF PROCEDURE: [name] is a 56 y.o.-year-old female with right knee medial root meniscus tear. Plans are to proceed with meniscus repair and diagnostic arthroscopy with debridement as indicated. Full discussion held regarding risks benefits alternatives and complications related surgical intervention. Conservative care options reviewed. All questions answered.   The patient was identified in the preoperative holding area and the operative extremity was marked. The patient was brought to the operating room and transferred to operating table in a supine position. Satisfactory general anesthesia was induced by anesthesiology.     Standard anterolateral, anteromedial arthroscopy portals were obtained. The anteromedial portal was obtained with a spinal needle for localization under direct visualization with subsequent diagnostic findings.    Anteromedial and anterolateral chambers: mild synovitis. The synovitis was debrided with a 4.5 mm full radius shaver through both the anteromedial and lateral portals.    Suprapatellar  pouch and gutters: mild synovitis or debris. Patella chondral surface: Grade 1 Trochlear chondral surface: Grade 3 Patellofemoral tracking: Midline level Medial meniscus: Complete radial tear at the posterior horn.   Remainder of medial meniscus tissue was healthy.  Medial femoral condyle flexion bearing surface: Grade 2 Medial femoral condyle extension bearing surface: Grade 2 Medial tibial plateau: Grade 0 Anterior cruciate ligament:stable Posterior cruciate ligament:stable Lateral meniscus: White zone tear of the lateral meniscus in the mid body and posterior horn.  Degenerative appearing short radial tears.   Lateral femoral condyle flexion bearing surface: Grade 0 Lateral femoral condyle extension bearing surface: Grade 0 Lateral tibial plateau: Grade 0  We first began with the partial lateral meniscectomy.  Utilizing the motorized shaver we debrided the short radial tears in the mid body and anterior and posterior horn with suction and shaver.  This created a nice smooth transition zone.   We proceeded with a medial meniscus root repair.  We first fenestrated the MCL to allow better for valgus opening of the medial compartment.  We then identified the tear..  This was a complete radial tear and we then elected to proceed with the repair after confirming minimal chondral changes on the medial femoral condyle and a pristine medial tibial plateau.  We then used a point-to-point drill guide to drill a transtibial tunnel to the root attachment.  We did medialize the root attachment a little bit to get to the healthy tissue of the medial meniscus.  We then introduced the Arthrex suture lock repair device.  This left [place] with 2 knotless sutures to work with.  We then used the meniscal scorpion to pass 2 simple passes through the healthy meniscus tissue at the tear.  We then cinched this down  with the knotless mechanism of the anchor to the bone.  This had excellent watertight repair.   After  completion of synovectomy, diagnostic exam, and debridements as described, all compartments were checked and no residual debris remained. Hemostasis was achieved with the cautery wand. The portals were approximated with buried monocryl. All excess fluid was expressed from the joint.  Xeroform sterile gauze dressings were applied followed by Ace bandage and ice pack.    DISPOSITION: The patient was awakened from general anesthetic, extubated, taken to the recovery room in medically stable condition, no apparent complications.  Touchdown weightbearing to the right lower extremity at this time.  She may begin bending the knee a little bit for activities of daily living but will try to avoid flexion beyond 90 degrees.  She will take aspirin once a day for 6 weeks for DVT prophylaxis.  She will be admitted due to need of postoperative assistance as well as pain control.  We will monitor her progress with therapy here and potentially will need disposition to SNF or inpatient rehab.

## 2022-08-10 NOTE — Anesthesia Preprocedure Evaluation (Addendum)
Anesthesia Evaluation  Patient identified by MRN, date of birth, ID band Patient awake    Reviewed: Allergy & Precautions, NPO status , Patient's Chart, lab work & pertinent test results  Airway Mallampati: II  TM Distance: >3 FB Neck ROM: Full    Dental  (+) Teeth Intact, Dental Advisory Given, Partial Upper   Pulmonary Current Smoker and Patient abstained from smoking.   Pulmonary exam normal breath sounds clear to auscultation       Cardiovascular negative cardio ROS Normal cardiovascular exam Rhythm:Regular Rate:Normal  EKG 08/06/22 NSR, Normal   Neuro/Psych  PSYCHIATRIC DISORDERS Anxiety Depression     Neuromuscular disease    GI/Hepatic Neg liver ROS,,,IBS   Endo/Other  Obesity  Renal/GU negative Renal ROS  negative genitourinary   Musculoskeletal  (+) Arthritis , Osteoarthritis,  Fibromyalgia -Torn Medial meniscus right knee Dercum's disease   Abdominal  (+) + obese  Peds  Hematology negative hematology ROS (+)   Anesthesia Other Findings   Reproductive/Obstetrics                             Anesthesia Physical Anesthesia Plan  ASA: 2  Anesthesia Plan: General   Post-op Pain Management: Minimal or no pain anticipated, Regional block*, Tylenol PO (pre-op)*, Precedex, Ketamine IV* and Toradol IV (intra-op)*   Induction: Intravenous  PONV Risk Score and Plan: 4 or greater and Treatment may vary due to age or medical condition, Ondansetron, Midazolam and Dexamethasone  Airway Management Planned: LMA  Additional Equipment: None  Intra-op Plan:   Post-operative Plan: Extubation in OR  Informed Consent: I have reviewed the patients History and Physical, chart, labs and discussed the procedure including the risks, benefits and alternatives for the proposed anesthesia with the patient or authorized representative who has indicated his/her understanding and acceptance.      Dental advisory given  Plan Discussed with: Anesthesiologist and CRNA  Anesthesia Plan Comments:         Anesthesia Quick Evaluation

## 2022-08-10 NOTE — Anesthesia Procedure Notes (Signed)
Procedure Name: LMA Insertion Date/Time: 08/10/2022 2:02 PM  Performed by: Garrel Ridgel, CRNAPre-anesthesia Checklist: Patient identified, Emergency Drugs available, Suction available and Patient being monitored Patient Re-evaluated:Patient Re-evaluated prior to induction Oxygen Delivery Method: Circle system utilized Preoxygenation: Pre-oxygenation with 100% oxygen Induction Type: IV induction Ventilation: Mask ventilation without difficulty LMA: LMA with gastric port inserted LMA Size: 4.0 Tube type: Oral Number of attempts: 1 Placement Confirmation: positive ETCO2 and breath sounds checked- equal and bilateral Tube secured with: Tape Dental Injury: Teeth and Oropharynx as per pre-operative assessment

## 2022-08-10 NOTE — Transfer of Care (Signed)
Immediate Anesthesia Transfer of Care Note  Patient: [name]  Procedure(s) Performed: Procedure(s) with comments: KNEE ARTHROSCOPY WITH ROOT MEDIAL MENISCAL REPAIR (Right) - 90  Patient Location: PACU  Anesthesia Type:General  Level of Consciousness: Alert, Awake, Oriented  Airway & Oxygen Therapy: Patient Spontanous Breathing  Post-op Assessment: Report given to RN  Post vital signs: Reviewed and stable  Last Vitals:  Vitals:   08/10/22 1215 08/10/22 1519  BP: 125/79 114/74  Pulse: 80 85  Resp: 16 (!) 9  Temp: 37.6 C 36.6 C  SpO2: 48% 889%    Complications: No apparent anesthesia complications

## 2022-08-10 NOTE — H&P (Signed)
ORTHOPAEDIC H&P  REQUESTING PHYSICIAN: Nicholes Stairs, MD  PCP:  [name], Betty G, MD  Chief Complaint: Right medial meniscus tear  HPI: [name] is a 56 y.o. female who complains of right knee pain and swelling.  She been doing with symptomatic medial meniscus root tear for the last 3 to 4 weeks.  This is resulted in an MRI that did show a diastased posterior root medial meniscus tear.  Here today for arthroscopic repair versus meniscectomy.  No new complaints at this time.  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   Past Surgical History:  Procedure Laterality Date   APPENDECTOMY  2002   BACK SURGERY  2007   L5 Lt discectiomy   CHOLECYSTECTOMY  2005   DENTAL SURGERY     multiple extraction   NOSE SURGERY  1974   fracture   SPINAL FUSION  2011   rod and screws   TONSILLECTOMY  1977   as a child   TUMOR REMOVAL Left 2002   lipoma under breast   Social History   Socioeconomic History   Marital status: Divorced    Spouse name: Not on file   Number of children: 3   Years of education: Not on file   Highest education level: Not on file  Occupational History   Not on file  Tobacco Use   Smoking status: Every Day    Packs/day: 0.50    Years: 23.00    Total pack years: 11.50    Types: Cigarettes    Start date: 69    Passive exposure: Current   Smokeless tobacco: Never  Vaping Use   Vaping Use: Never used  Substance and Sexual Activity   Alcohol use: No   Drug use: No   Sexual activity: Not Currently  Other Topics Concern   Not on file   Social History Narrative   Not on file   Social Determinants of Health   Financial Resource Strain: Low Risk  (02/12/2022)   Overall Financial Resource Strain (CARDIA)    Difficulty of Paying Living Expenses: Not hard at all  Food Insecurity: No Food Insecurity (07/17/2022)   Hunger Vital Sign    Worried About Running Out of Food in the Last Year: Never true    Ran Out of Food in the Last Year: Never true  Transportation Needs: No Transportation Needs (07/17/2022)   PRAPARE - Hydrologist (Medical): No    Lack of Transportation (Non-Medical): No  Physical Activity: Insufficiently Active (02/12/2022)   Exercise Vital Sign    Days of Exercise per Week: 4 days    Minutes of Exercise per Session: 30 min  Stress: Stress Concern Present (02/12/2022)   [place]    Feeling of Stress : To some extent  Social Connections: Moderately Isolated (02/07/2021)   Social Connection and Isolation Panel [NHANES]    Frequency of Communication  with Friends and Family: More than three times a week    Frequency of Social Gatherings with Friends and Family: More than three times a week    Attends Religious Services: More than 4 times per year    Active Member of [employer] or Organizations: No    Attends Music therapist: Never    Marital Status: Divorced   Family History  Problem Relation Age of Onset   Aneurysm Mother    Colon polyps Father    Lung cancer Father    Heart disease Brother        CABG at 20, fatal MI at 22   Heart disease Brother    Colon cancer Neg Hx    Esophageal cancer Neg Hx    Inflammatory bowel disease Neg Hx    Liver disease Neg Hx    Pancreatic cancer Neg Hx    Rectal cancer Neg Hx    Stomach cancer Neg Hx    Celiac disease Neg Hx    Crohn's disease Neg Hx    Allergies  Allergen Reactions   Pneumovax 23 [Pneumococcal Vac Polyvalent] Other (See Comments)    Arm  mass   Seroquel [Quetiapine Fumarate] Nausea Only    Nausea, headache and lethargy   Prior to Admission medications   Medication Sig Start Date End Date Taking? Authorizing Provider  acetaminophen (TYLENOL) 500 MG tablet Take 1,000 mg by mouth every 6 (six) hours as needed for mild pain or moderate pain.   Yes [provider]  Artificial Saliva (BIOTENE DRY MOUTH) GUM Use as directed 1 each in the mouth or throat daily as needed (Dry mouth).   Yes [provider]  buprenorphine (BUTRANS) 15 MCG/HR Place 1 patch onto the skin once a week. 06/21/21  Yes [provider]  docusate sodium (COLACE) 100 MG capsule Take 100 mg by mouth every evening.   Yes [provider]  DULoxetine (CYMBALTA) 60 MG capsule Take 60 mg by mouth every evening.   Yes [provider]  fluticasone (FLONASE) 50 MCG/ACT nasal spray SPRAY 2 SPRAYS INTO EACH NOSTRIL EVERY DAY Patient taking differently: Place 2 sprays into both nostrils once a week. Applied topically to area pain patch is applied to 06/05/22  Yes [name], Betty G, MD  gabapentin (NEURONTIN) 600 MG tablet Take 1,200 mg by mouth 3 (three) times daily. 01/23/19  Yes [provider]  HYDROcodone-acetaminophen (NORCO/VICODIN) 5-325 MG tablet Take 2 tablets by mouth every 4 (four) hours as needed. Patient taking differently: Take 1 tablet by mouth every 6 (six) hours. 07/08/22  Yes Long, Wonda Olds, MD  meloxicam (MOBIC) 15 MG tablet Take 15 mg by mouth in the morning. 02/16/21  Yes [provider]  methocarbamol (ROBAXIN) 500 MG tablet Take 500 mg by mouth 4 (four) times daily. 07/10/22  Yes [provider]  Multiple Vitamin (MULTIVITAMIN WITH MINERALS) TABS tablet Take 1 tablet by mouth daily. One A Day Multivitamin   Yes [provider]  traZODone (DESYREL) 100 MG tablet Take 100 mg by mouth at bedtime. 04/04/21  Yes [provider]  aspirin EC 81 MG tablet Take 81 mg by mouth in the  morning and at bedtime.    [provider]  Benzocaine (KANK-A MT) Use as directed 1 Application in the mouth or throat as needed (oral sores).    [provider]  senna-docusate (SENOKOT-S) 8.6-50 MG tablet Take 1 tablet by mouth at bedtime as needed for mild constipation. Patient  not taking: Reported on 08/02/2022 07/08/22   Long, Wonda Olds, MD   No results found.  Positive ROS: All other systems have been reviewed and were otherwise negative with the exception of those mentioned in the HPI and as above.  Physical Exam: General: Alert, no acute distress Cardiovascular: No pedal edema Respiratory: No cyanosis, no use of accessory musculature GI: No organomegaly, abdomen is soft and non-tender Skin: No lesions in the area of chief complaint Neurologic: Sensation intact distally Psychiatric: Patient is competent for consent with normal mood and affect Lymphatic: No axillary or cervical lymphadenopathy  MUSCULOSKELETAL: Right lower extremity is warm and well-perfused.  No open wounds or lesions.  Neurovascular intact.  Assessment: Right knee medial meniscus root tear, complete.  Plan: Ms. Mirna Mires and I again reviewed her pathology.  We discussed treatment options which include the repair versus partial medial meniscectomy.  At this juncture she is optimistic and hopeful we can proceed with the repair.  We will know more at the time of the arthroscopy.  We discussed risk of bleeding, infection, damage to surrounding nerves and vessels, failure of repair, progression of disease, development arthroscopic deafness, DVT risk as well as the risk of anesthesia.  He has provided informed consent.  She does live independently and alone and will need postoperative admission for inpatient consideration of rehabilitation such as SNF versus inpatient rehab.  We will plan to admit postop.    Nicholes Stairs, MD Cell [phone]    08/10/2022 1:47 PM

## 2022-08-11 LAB — HIV ANTIBODY (ROUTINE TESTING W REFLEX): HIV Screen 4th Generation wRfx: NONREACTIVE

## 2022-08-11 MED ORDER — OXYCODONE-ACETAMINOPHEN 7.5-325 MG PO TABS: 1.0000 | ORAL_TABLET | Freq: Four times a day (QID) | ORAL | 0 refills | Status: DC | PRN

## 2022-08-11 MED ORDER — NICOTINE 21 MG/24HR TD PT24
21.0000 mg | MEDICATED_PATCH | Freq: Every day | TRANSDERMAL | Status: DC
  Administered 2022-08-11 – 2022-08-13 (×3): 21 mg via TRANSDERMAL
  Filled 2022-08-11 (×3): qty 1

## 2022-08-11 MED ORDER — ONDANSETRON HCL 4 MG PO TABS: 4.0000 mg | ORAL_TABLET | Freq: Three times a day (TID) | ORAL | 0 refills | Status: DC | PRN

## 2022-08-11 MED ORDER — DIPHENHYDRAMINE HCL 12.5 MG/5ML PO ELIX
12.5000 mg | ORAL_SOLUTION | Freq: Four times a day (QID) | ORAL | Status: DC | PRN
  Administered 2022-08-11 – 2022-08-13 (×4): 12.5 mg via ORAL
  Filled 2022-08-11 (×4): qty 5

## 2022-08-11 NOTE — TOC Initial Note (Signed)
Transition of Care [facility]) - Initial/Assessment Note    Patient Details  Name: [name] MRN: [id number] Date of Birth: January 16, 1966  Transition of [facility]) CM/SW Contact:    Vassie Moselle, LCSW Phone Number: 08/11/2022, 3:47 PM  Clinical Narrative:                 Met with pt and daughter at bedside. Pt is agreeable to SNF placement and prefers facility in [place] area as this is where her daughter lives.  Pt's PASRR is currently screening and will need to upload H&P, progress notes, and FL-2 once is has been signed. MD notified of needing FL-2 signed.  Referrals have been sent out for SNF placement and currently awaiting bed offers.   Expected Discharge Plan: Skilled Nursing Facility Barriers to Discharge: No Barriers Identified   Patient Goals and CMS Choice Patient states their goals for this hospitalization and ongoing recovery are:: To go to SNF and then go home CMS Medicare.gov Compare Post Acute Care list provided to:: Patient Choice offered to / list presented to : Patient, Adult Children  Expected Discharge Plan and Services Expected Discharge Plan: [place] In-house Referral: NA Discharge Planning Services: CM Consult Post Acute Care Choice: [place] Living arrangements for the past 2 months: Apartment                 DME Arranged: N/A DME Agency: NA                  Prior Living Arrangements/Services Living arrangements for the past 2 months: Apartment Lives with:: Self Patient language and need for interpreter reviewed:: Yes Do you feel safe going back to the place where you live?: Yes      Need for Family Participation in Patient Care: No (Comment) Care giver support system in place?: No (comment)   Criminal Activity/Legal Involvement Pertinent to Current Situation/Hospitalization: No - Comment as needed  Activities of Daily Living Home Assistive Devices/Equipment: None ADL Screening  (condition at time of admission) Patient's cognitive ability adequate to safely complete daily activities?: Yes Is the patient deaf or have difficulty hearing?: No Does the patient have difficulty seeing, even when wearing glasses/contacts?: No Does the patient have difficulty concentrating, remembering, or making decisions?: No Patient able to express need for assistance with ADLs?: Yes Does the patient have difficulty dressing or bathing?: No Independently performs ADLs?: Yes (appropriate for developmental age) Does the patient have difficulty walking or climbing stairs?: No Weakness of Legs: Right Weakness of Arms/Hands: None  Permission Sought/Granted Permission sought to share information with : Facility Sport and exercise psychologist, Family Supports Permission granted to share information with : Yes, Verbal Permission Granted  Share Information with NAME: Fayne Mediate     Permission granted to share info w Relationship: Daughter  Permission granted to share info w Contact Information: [phone]  Emotional Assessment Appearance:: Appears stated age Attitude/Demeanor/Rapport: Engaged Affect (typically observed): Accepting Orientation: : Oriented to Self, Oriented to Place, Oriented to  Time, Oriented to Situation Alcohol / Substance Use: Not Applicable Psych Involvement: No (comment)  Admission diagnosis:  S/P medial meniscus repair of right knee [Z98.890] Patient Active Problem List   Diagnosis Date Noted   S/P medial meniscus repair of right knee 08/10/2022   Blood in stool 07/15/2021   Hematochezia 07/15/2021   History of diverticulitis 07/15/2021   Morbid obesity ([place]) 04/21/2021   Chronic fatigue 04/21/2021   Vitamin D deficiency 12/08/2020   Elevated fasting glucose  12/08/2020   Benign microscopic hematuria 11/03/2020   Exertional dyspnea 06/12/2019   Family history of early CAD 06/12/2019   Anxiety and depression 02/13/2019   History of migraine 02/13/2019    HLD (hyperlipidemia) 02/13/2019   Diverticulosis of colon 02/13/2019   Dercum disease 02/13/2019   Chronic pain syndrome 02/13/2019   Fibromyalgia 02/13/2019   PCP:  [name], Betty G, MD Pharmacy:   [id number] [address]Phone: [phone]: [phone]    Social Determinants of Health (SDOH) Interventions    Readmission Risk Interventions    08/11/2022    3:44 PM  Readmission Risk Prevention Plan  Post Dischage Appt Complete  Medication Screening Complete  Transportation Screening Complete

## 2022-08-11 NOTE — Progress Notes (Signed)
   Subjective:  Patient reports pain as moderate to severe.  Had some significant breakthrough pain overnight.  She states she is doing better today.  No nausea vomiting, shortness of breath or chest pain.  She has yet to work with therapy yet this morning.  Objective:   VITALS:   Vitals:   08/10/22 1844 08/10/22 2035 08/11/22 0148 08/11/22 0508  BP: 114/74 116/64 (!) 100/57 (!) 99/59  Pulse: 80 84 77 73  Resp: '16 18 18 18  '$ Temp: 98.3 F (36.8 C) 98.7 F (37.1 C) 98 F (36.7 C) 98.6 F (37 C)  TempSrc:   Oral Oral  SpO2: 98% 95% 93% 94%  Weight:      Height:        Neurovascular intact Sensation intact distally Intact pulses distally Dorsiflexion/Plantar flexion intact Compartment soft Negative [place], no signs of DVT. Incisions are clean, dry, and intact.  Lab Results  Component Value Date   WBC 10.6 (H) 08/10/2022   HGB 13.6 08/10/2022   HCT 41.6 08/10/2022   MCV 95.0 08/10/2022   PLT 316 08/10/2022   BMET    Component Value Date/Time   NA 140 08/06/2022 1405   K 4.9 08/06/2022 1405   CL 107 08/06/2022 1405   CO2 28 08/06/2022 1405   GLUCOSE 129 (H) 08/06/2022 1405   BUN 19 08/06/2022 1405   CREATININE 0.81 08/10/2022 1929   CALCIUM 9.0 08/06/2022 1405   GFRNONAA >60 08/10/2022 1929     Assessment/Plan: 1 Day Post-Op   Principal Problem:   S/P medial meniscus repair of right knee   Advance diet Up with therapy  -Touchdown weightbearing to right lower extremity with walker.  -Lovenox daily for DVT prophylaxis in the hospital and then discharged home on 81 mg aspirin once per day x6 weeks.  -Patient lives alone and has significant concerns about safety at home.  Will likely need a SNF at discharge.  We will follow-up with physical therapy recommendations.  Possible DC Sunday but likely Monday given request for SNF.   Nicholes Stairs 08/11/2022, 8:29 AM   Geralynn Rile, MD [phone]

## 2022-08-11 NOTE — Evaluation (Signed)
Physical Therapy Evaluation Patient Details Name: [name] MRN: [id number] DOB: Nov 03, 1965 Today's Date: 08/11/2022  History of Present Illness  56 yo female s/p Right knee arthroscopy with limited synovectomy, medial meniscus root repair, chondroplasty medial femoral condyle and trochlea, and  partial lateral meniscectomy. PMH: depression, back surgery, fibromyalgia  Clinical Impression  Pt admitted with above diagnosis.  PT able to amb very short distance in room, able to maintain TDWB however limited by fatigue and pain. Pt states the MD told her she is "not supposed do anything, any therapy for 4 mos".  Pt is requesting to go outside, explained to her that this would require an MD order and that we likely do not have staff to support this. Pt requesting SNF as she does not have consistent help at home since her dtr has to work.  Pt currently with functional limitations due to the deficits listed below (see PT Problem List). Pt will benefit from skilled PT to increase their independence and safety with mobility to allow discharge to the venue listed below.          Recommendations for follow up therapy are one component of a multi-disciplinary discharge planning process, led by the attending physician.  Recommendations may be updated based on patient status, additional functional criteria and insurance authorization.  Follow Up Recommendations Skilled nursing-short term rehab (<3 hours/day)      Assistance Recommended at Discharge Intermittent Supervision/Assistance  Patient can return home with the following  A little help with bathing/dressing/bathroom;Assistance with cooking/housework;Assist for transportation;Help with stairs or ramp for entrance    Equipment Recommendations Other (comment) (defer to SNF)  Recommendations for Other Services       Functional Status Assessment Patient has had a recent decline in their functional status and demonstrates the ability  to make significant improvements in function in a reasonable and predictable amount of time.     Precautions / Restrictions Precautions Precautions: Fall;Knee Restrictions Weight Bearing Restrictions: Yes RLE Weight Bearing: Touchdown weight bearing      Mobility  Bed Mobility Overal bed mobility: Needs Assistance Bed Mobility: Supine to Sit     Supine to sit: Min assist, Min guard     General bed mobility comments: assist to progress RLE off bed    Transfers Overall transfer level: Needs assistance Equipment used: Rolling walker (2 wheels) Transfers: Sit to/from Stand Sit to Stand: Min guard           General transfer comment: cues for hand placement and RLE position    Ambulation/Gait Ambulation/Gait assistance: Min assist Gait Distance (Feet): 6 Feet Assistive device: Rolling walker (2 wheels) Gait Pattern/deviations: Step-to pattern       General Gait Details: cues for sequence, pt able to maintain TDWB however fatigues rapidly  Stairs            Wheelchair Mobility    Modified Rankin (Stroke Patients Only)       Balance Overall balance assessment: Needs assistance Sitting-balance support: Feet supported, No upper extremity supported Sitting balance-Leahy Scale: Good     Standing balance support: During functional activity, Bilateral upper extremity supported, Reliant on assistive device for balance Standing balance-Leahy Scale: Poor                               Pertinent Vitals/Pain Pain Assessment Pain Assessment: Faces Faces Pain Scale: Hurts whole lot Pain Location: right knee Pain Descriptors / Indicators: Aching, Guarding, Grimacing,  Sore Pain Intervention(s): Limited activity within patient's tolerance, Monitored during session, Premedicated before session, Repositioned, Ice applied    Home Living Family/patient expects to be discharged to:: Skilled nursing facility                        Prior  Function Prior Level of Function : Independent/Modified Independent             Mobility Comments: pt reports she was furniture walking at home, barely able to put wt on her RLE prior to surgery d/t pain       Hand Dominance        Extremity/Trunk Assessment   Upper Extremity Assessment Upper Extremity Assessment: Overall WFL for tasks assessed (pt reports multiple UE issues, arthritis,etc)    Lower Extremity Assessment Lower Extremity Assessment: RLE deficits/detail RLE Deficits / Details: ankle WFL, knee AAROM grossly 12 to 35 degrees flexion. strength 2+/5 knee and hip, limited by pain       Communication   Communication: No difficulties  Cognition Arousal/Alertness: Awake/alert Behavior During Therapy: WFL for tasks assessed/performed Overall Cognitive Status: Within Functional Limits for tasks assessed                                 General Comments: pt is tangetential, requires redirection to task, alert and oriented x4        General Comments      Exercises General Exercises - Lower Extremity Ankle Circles/Pumps: AROM, Both, 5 reps Heel Slides: 5 reps, Right (gentle ROM)   Assessment/Plan    PT Assessment Patient needs continued PT services  PT Problem List Decreased strength;Decreased range of motion;Decreased activity tolerance;Decreased balance;Decreased mobility;Decreased knowledge of precautions;Obesity       PT Treatment Interventions DME instruction;Therapeutic exercise;Gait training;Functional mobility training;Therapeutic activities;Patient/family education;Balance training    PT Goals (Current goals can be found in the Care Plan section)  Acute Rehab PT Goals Patient Stated Goal: to go to rehab PT Goal Formulation: With patient Time For Goal Achievement: 08/25/22 Potential to Achieve Goals: Good    Frequency 7X/week     Co-evaluation               AM-PAC PT "6 Clicks" Mobility  Outcome Measure Help needed  turning from your back to your side while in a flat bed without using bedrails?: A Little Help needed moving from lying on your back to sitting on the side of a flat bed without using bedrails?: A Little Help needed moving to and from a bed to a chair (including a wheelchair)?: A Little Help needed standing up from a chair using your arms (e.g., wheelchair or bedside chair)?: A Little Help needed to walk in hospital room?: A Little Help needed climbing 3-5 steps with a railing? : A Lot 6 Click Score: 17    End of Session Equipment Utilized During Treatment: Gait belt Activity Tolerance: Patient limited by fatigue;Patient limited by pain Patient left: in chair;with call bell/phone within reach Nurse Communication: Mobility status PT Visit Diagnosis: Other abnormalities of gait and mobility (R26.89);Difficulty in walking, not elsewhere classified (R26.2)    Time: 1610-9604 PT Time Calculation (min) (ACUTE ONLY): 24 min   Charges:   PT Evaluation $PT Eval Low Complexity: 1 Low PT Treatments $Gait Training: 8-22 mins        Baxter Flattery, PT  Acute Rehab Dept [facility]) [phone]  [facility] Weekend Pager (Saturday/Sunday only)  [phone]  08/11/2022   [facility] 08/11/2022, 10:17 AM

## 2022-08-11 NOTE — [place] FL2 (Signed)
[place] MEDICAID FL2 LEVEL OF CARE SCREENING TOOL     IDENTIFICATION  Patient Name: [name] Birthdate: 24-Mar-1966 Sex: female Admission Date (Current Location): 08/10/2022  [facility] and [place] Number:  Herbalist and Address:  [facility],  [place], [place]      Provider Number: [id number]  Attending Physician Name and Address:  Nicholes Stairs, MD  Relative Name and Phone Number:  Daughter, Fayne Mediate [phone]    Current Level of Care: Hospital Recommended Level of Care: [place] Prior Approval Number:    Date Approved/Denied:   PASRR Number: Pending  Discharge Plan: SNF    Current Diagnoses: Patient Active Problem List   Diagnosis Date Noted   S/P medial meniscus repair of right knee 08/10/2022   Blood in stool 07/15/2021   Hematochezia 07/15/2021   History of diverticulitis 07/15/2021   Morbid obesity ([place]) 04/21/2021   Chronic fatigue 04/21/2021   Vitamin D deficiency 12/08/2020   Elevated fasting glucose 12/08/2020   Benign microscopic hematuria 11/03/2020   Exertional dyspnea 06/12/2019   Family history of early CAD 06/12/2019   Anxiety and depression 02/13/2019   History of migraine 02/13/2019   HLD (hyperlipidemia) 02/13/2019   Diverticulosis of colon 02/13/2019   Dercum disease 02/13/2019   Chronic pain syndrome 02/13/2019   Fibromyalgia 02/13/2019    Orientation RESPIRATION BLADDER Height & Weight     Self, Situation, Place, Time  Normal Continent Weight: 215 lb (97.5 kg) Height:  5' 3.5" (161.3 cm)  BEHAVIORAL SYMPTOMS/MOOD NEUROLOGICAL BOWEL NUTRITION STATUS      Continent    AMBULATORY STATUS COMMUNICATION OF NEEDS Skin   Limited Assist Verbally Normal                       Personal Care Assistance Level of Assistance  Bathing, Feeding, Dressing Bathing Assistance: Limited assistance Feeding assistance: Independent Dressing Assistance:  Limited assistance     Functional Limitations Info  Sight, Hearing, Speech Sight Info: Adequate Hearing Info: Adequate Speech Info: Adequate    SPECIAL CARE FACTORS FREQUENCY  PT (By licensed PT), OT (By licensed OT)     PT Frequency: 5x/wk OT Frequency: 5x/wk            Contractures Contractures Info: Not present    Additional Factors Info  Code Status, Allergies, Psychotropic Code Status Info: FULL Allergies Info: Pneumovax 23 (Pneumococcal Vac Polyvalent), Seroquel (Quetiapine Fumarate) Psychotropic Info: See MAR         Current Medications (08/11/2022):  This is the current hospital active medication list Current Facility-Administered Medications  Medication Dose Route Frequency Provider Last Rate Last Admin   acetaminophen (TYLENOL) tablet 1,000 mg  1,000 mg Oral Q6H PRN Nicholes Stairs, MD       acetaminophen (TYLENOL) tablet 325-650 mg  325-650 mg Oral Q6H PRN Nicholes Stairs, MD       aspirin EC tablet 81 mg  81 mg Oral Daily Nicholes Stairs, MD   81 mg at 08/11/22 0851   docusate sodium (COLACE) capsule 100 mg  100 mg Oral QPM Nicholes Stairs, MD   100 mg at 08/10/22 2054   DULoxetine (CYMBALTA) DR capsule 60 mg  60 mg Oral QPM Nicholes Stairs, MD   60 mg at 08/10/22 2057   enoxaparin (LOVENOX) injection 40 mg  40 mg Subcutaneous Q24H Nicholes Stairs, MD   40 mg at 08/11/22 0851   gabapentin (  NEURONTIN) capsule 1,200 mg  1,200 mg Oral TID Nicholes Stairs, MD   1,200 mg at 08/11/22 0851   HYDROmorphone (DILAUDID) injection 0.5-1 mg  0.5-1 mg Intravenous Q4H PRN Nicholes Stairs, MD   1 mg at 08/11/22 1254   meloxicam (MOBIC) tablet 15 mg  15 mg Oral Daily Nicholes Stairs, MD   15 mg at 08/11/22 0850   methocarbamol (ROBAXIN) tablet 500 mg  500 mg Oral QID Nicholes Stairs, MD   500 mg at 08/11/22 1416   multivitamin with minerals tablet 1 tablet  1 tablet Oral Daily Nicholes Stairs, MD   1 tablet at  08/11/22 0851   ondansetron (ZOFRAN) tablet 4 mg  4 mg Oral Q6H PRN Nicholes Stairs, MD       Or   ondansetron [facility]) injection 4 mg  4 mg Intravenous Q6H PRN Nicholes Stairs, MD       oxyCODONE (Oxy IR/ROXICODONE) immediate release tablet 10-15 mg  10-15 mg Oral Q4H PRN Nicholes Stairs, MD   15 mg at 08/11/22 7673   oxyCODONE (Oxy IR/ROXICODONE) immediate release tablet 5-10 mg  5-10 mg Oral Q4H PRN Nicholes Stairs, MD       traZODone (DESYREL) tablet 100 mg  100 mg Oral QHS Nicholes Stairs, MD   100 mg at 08/10/22 2053     Discharge Medications: Please see discharge summary for a list of discharge medications.  Relevant Imaging Results:  Relevant Lab Results:   Additional Information SSN: [id number]  Vassie Moselle, LCSW

## 2022-08-11 NOTE — Discharge Instructions (Signed)
Post-operative patient instructions  Knee Arthroscopy   Ice:  Place intermittent ice or cooler pack over your knee, 30 minutes on and 30 minutes off.  Continue this for the first 72 hours after surgery, then save ice for use after therapy sessions or on more active days.   Weight: Touchdown weightbearing to right lower extremity with walker DVT prevention: Perform ankle pumps as able throughout the day on the operative extremity.  Be mobile as possible with ambulation as able.  You should also take an 81 mg aspirin once per day x6 weeks. Strengthening:  Perform simple thigh squeezes (isometric quad contractions) and straight leg lifts as you are able (3 sets of 5 to 10 repetitions, 3 times a day).  For the leg lifts, have someone support under your ankle in the beginning until you have increased strength enough to do this on your own.  To help get started on thigh squeezes, place a pillow under your knee and push down on the pillow with back of knee (sometimes easier to do than with your leg fully straight). Motion:  Perform gentle knee motion as tolerated - this is gentle bending and straightening of the knee. Seated heel slides: you can start by sitting in a chair, remove your brace, and gently slide your heel back on the floor - allowing your knee to bend. Have someone help you straighten your knee (or use your other leg/foot hooked under your ankle.  Dressing:  Perform 1st dressing change at 3 days postoperative. A moderate amount of blood tinged drainage is to be expected.  So if you bleed through the dressing on the first or second day or if you have fevers, it is fine to change the dressing/check the wounds early and redress wound. Elevate your leg.  If it bleeds through again, or if the incisions are leaking frank blood, please call the office. May change dressing every 1-2 days thereafter to help watch wounds. Can purchase Tegderm (or 74M Nexcare) water resistant dressings at local pharmacy /  [employer].   Shower:  shower is ok after 3 days.  Please take shower, NO bath. Recover with gauze and ace wrap to help keep wounds protected.   Pain medication:  A narcotic pain medication has been prescribed.  Take as directed.  Typically you need narcotic pain medication more regularly during the first 3 to 5 days after surgery.  Decrease your use of the medication as the pain improves.  Narcotics can sometimes cause constipation, even after a few doses.  If you have problems with constipation, you can take an over the counter stool softener or light laxative.  If you have persistent problems, please notify your physician's office. Physical therapy: Additional activity guidelines to be provided by your physician or physical therapist at follow-up visits.  Driving: Do not recommend driving x 1-2 weeks post surgical, especially if surgery performed on right side. Should not drive while taking narcotic pain medications. It typically takes at least 2 weeks to restore sufficient neuromuscular function for normal reaction times for driving safety.  Call [phone] for questions or problems. Evenings you will be forwarded to the hospital operator.  Ask for the orthopaedic physician on call. Please call if you experience:    Redness, foul smelling, or persistent drainage from the surgical site  worsening knee pain and swelling not responsive to medication  any calf pain and or swelling of the lower leg  temperatures greater than 101.5 F other questions or concerns   Thank  you for allowing [place] to be a part of your care

## 2022-08-12 NOTE — Progress Notes (Signed)
Physical Therapy Treatment Patient Details Name: [name] MRN: [id number] DOB: Feb 15, 1966 Today's Date: 08/12/2022   History of Present Illness 56 yo female s/p Right knee arthroscopy with limited synovectomy, medial meniscus root repair, chondroplasty medial femoral condyle and trochlea, and  partial lateral meniscectomy. PMH: depression, back surgery, fibromyalgia    PT Comments    Pt progressing toward goals. Pain improved however fatigues easily with gait/functional tasks. Will benefit from SNF ot maximize independence    Recommendations for follow up therapy are one component of a multi-disciplinary discharge planning process, led by the attending physician.  Recommendations may be updated based on patient status, additional functional criteria and insurance authorization.  Follow Up Recommendations  Skilled nursing-short term rehab (<3 hours/day) Can patient physically be transported by private vehicle: Yes   Assistance Recommended at Discharge Intermittent Supervision/Assistance  Patient can return home with the following A little help with bathing/dressing/bathroom;Assistance with cooking/housework;Assist for transportation;Help with stairs or ramp for entrance   Equipment Recommendations  Other (comment) (defer to SNF)    Recommendations for Other Services       Precautions / Restrictions Precautions Precautions: Fall;Knee Precaution Booklet Issued: No Restrictions RLE Weight Bearing: Touchdown weight bearing     Mobility  Bed Mobility               General bed mobility comments: in recliner    Transfers Overall transfer level: Needs assistance Equipment used: Rolling walker (2 wheels) Transfers: Sit to/from Stand Sit to Stand: Min guard           General transfer comment: cues for hand placement and RLE position    Ambulation/Gait Ambulation/Gait assistance: Min guard Gait Distance (Feet): 22 Feet Assistive device: Rolling walker  (2 wheels)         General Gait Details: cues for sequence, pt able to maintain TDWB/NWB however fatigues easily, chair pulled - pt needed seated rest after above distance   Stairs             Wheelchair Mobility    Modified Rankin (Stroke Patients Only)       Balance     Sitting balance-Leahy Scale: Good       Standing balance-Leahy Scale: Poor                              Cognition Arousal/Alertness: Awake/alert Behavior During Therapy: WFL for tasks assessed/performed Overall Cognitive Status: Within Functional Limits for tasks assessed                                 General Comments: pt is talkative, requires redirection to task, alert and oriented x4        Exercises General Exercises - Lower Extremity Ankle Circles/Pumps: AROM, Both, 10 reps Quad Sets: AROM, Right, 10 reps Heel Slides: AAROM, Right, 10 reps Hip ABduction/ADduction: AROM, AAROM, Right, 10 reps Straight Leg Raises: AROM, Right, 5 reps    General Comments        Pertinent Vitals/Pain Pain Assessment Pain Assessment: Faces Faces Pain Scale: Hurts little more Pain Location: right knee Pain Descriptors / Indicators: Aching, Guarding, Grimacing, Sore Pain Intervention(s): Limited activity within patient's tolerance, Monitored during session, Premedicated before session, Repositioned    Home Living  Prior Function            PT Goals (current goals can now be found in the care plan section) Acute Rehab PT Goals Patient Stated Goal: to go to rehab PT Goal Formulation: With patient Time For Goal Achievement: 08/25/22 Potential to Achieve Goals: Good Progress towards PT goals: Progressing toward goals    Frequency    7X/week      PT Plan Current plan remains appropriate    Co-evaluation              AM-PAC PT "6 Clicks" Mobility   Outcome Measure  Help needed turning from your back to your side  while in a flat bed without using bedrails?: A Little Help needed moving from lying on your back to sitting on the side of a flat bed without using bedrails?: A Little Help needed moving to and from a bed to a chair (including a wheelchair)?: A Little Help needed standing up from a chair using your arms (e.g., wheelchair or bedside chair)?: A Little Help needed to walk in hospital room?: A Little Help needed climbing 3-5 steps with a railing? : A Lot 6 Click Score: 17    End of Session Equipment Utilized During Treatment: Gait belt Activity Tolerance: Patient tolerated treatment well Patient left: in chair;with call bell/phone within reach;with chair alarm set Nurse Communication: Mobility status PT Visit Diagnosis: Other abnormalities of gait and mobility (R26.89);Difficulty in walking, not elsewhere classified (R26.2)     Time: 4401-0272 PT Time Calculation (min) (ACUTE ONLY): 17 min  Charges:  $Gait Training: 8-22 mins                     Baxter Flattery, PT  Acute Rehab Dept [facility]) [phone]  [facility] Weekend Pager [facility] only)  [phone]  08/12/2022    [facility] 08/12/2022, 3:17 PM

## 2022-08-12 NOTE — Progress Notes (Signed)
    Subjective:  Patient reports pain as mild to moderate.  Denies N/V/CP/SOB. No c/o.  Objective:   VITALS:   Vitals:   08/11/22 1323 08/11/22 1720 08/11/22 2230 08/12/22 0612  BP: 95/61 98/74 (!) 110/59 (!) 127/59  Pulse: 73 71 74 78  Resp: '18 17 18 16  '$ Temp: 98.4 F (36.9 C) 98 F (36.7 C) 98.7 F (37.1 C) 98.5 F (36.9 C)  TempSrc: Oral Oral Oral Oral  SpO2: 93% 93% 94% 97%  Weight:      Height:        NAD ABD soft Sensation intact distally Intact pulses distally Dorsiflexion/Plantar flexion intact Incision: dressing C/D/I Compartment soft   Lab Results  Component Value Date   WBC 10.6 (H) 08/10/2022   HGB 13.6 08/10/2022   HCT 41.6 08/10/2022   MCV 95.0 08/10/2022   PLT 316 08/10/2022   BMET    Component Value Date/Time   NA 140 08/06/2022 1405   K 4.9 08/06/2022 1405   CL 107 08/06/2022 1405   CO2 28 08/06/2022 1405   GLUCOSE 129 (H) 08/06/2022 1405   BUN 19 08/06/2022 1405   CREATININE 0.81 08/10/2022 1929   CALCIUM 9.0 08/06/2022 1405   GFRNONAA >60 08/10/2022 1929     Assessment/Plan: 2 Days Post-Op   Principal Problem:   S/P medial meniscus repair of right knee   TDWB RLE with walker DVT ppx:  Lovenox in house --> d/c on ASA 81 BID , SCDs, TEDS PO pain control PT/OT Dispo: SNF placement   [name] 08/12/2022, 8:48 AM   Rod Can, MD [phone] [name] is now [facility]  [place] [address], [address], [place], [place] [address] Phone: [phone] [email or web address] Facebook  [employer]

## 2022-08-13 ENCOUNTER — Encounter (HOSPITAL_COMMUNITY): Payer: Self-pay | Admitting: Orthopedic Surgery

## 2022-08-13 ENCOUNTER — Other Ambulatory Visit: Payer: Self-pay

## 2022-08-13 DIAGNOSIS — R2689 Other abnormalities of gait and mobility: Secondary | ICD-10-CM | POA: Diagnosis not present

## 2022-08-13 DIAGNOSIS — M1990 Unspecified osteoarthritis, unspecified site: Secondary | ICD-10-CM | POA: Diagnosis not present

## 2022-08-13 DIAGNOSIS — Z743 Need for continuous supervision: Secondary | ICD-10-CM | POA: Diagnosis not present

## 2022-08-13 DIAGNOSIS — R2681 Unsteadiness on feet: Secondary | ICD-10-CM | POA: Diagnosis not present

## 2022-08-13 DIAGNOSIS — S83241D Other tear of medial meniscus, current injury, right knee, subsequent encounter: Secondary | ICD-10-CM | POA: Diagnosis not present

## 2022-08-13 DIAGNOSIS — E559 Vitamin D deficiency, unspecified: Secondary | ICD-10-CM | POA: Diagnosis not present

## 2022-08-13 DIAGNOSIS — Z4789 Encounter for other orthopedic aftercare: Secondary | ICD-10-CM | POA: Diagnosis not present

## 2022-08-13 DIAGNOSIS — R531 Weakness: Secondary | ICD-10-CM | POA: Diagnosis not present

## 2022-08-13 DIAGNOSIS — Z7401 Bed confinement status: Secondary | ICD-10-CM | POA: Diagnosis not present

## 2022-08-13 DIAGNOSIS — M6281 Muscle weakness (generalized): Secondary | ICD-10-CM | POA: Diagnosis not present

## 2022-08-13 DIAGNOSIS — M797 Fibromyalgia: Secondary | ICD-10-CM | POA: Diagnosis not present

## 2022-08-13 MED ORDER — HYDROXYZINE HCL 50 MG/ML IM SOLN
50.0000 mg | Freq: Four times a day (QID) | INTRAMUSCULAR | Status: DC | PRN
  Administered 2022-08-13: 50 mg via INTRAMUSCULAR
  Filled 2022-08-13 (×2): qty 1

## 2022-08-13 MED ORDER — OXYCODONE-ACETAMINOPHEN 7.5-325 MG PO TABS: 1.0000 | ORAL_TABLET | Freq: Four times a day (QID) | ORAL | 0 refills | Status: DC | PRN

## 2022-08-13 MED ORDER — ONDANSETRON HCL 4 MG PO TABS: 4.0000 mg | ORAL_TABLET | Freq: Three times a day (TID) | ORAL | 0 refills | Status: DC | PRN

## 2022-08-13 NOTE — TOC Transition Note (Signed)
Transition of Care [facility]) - CM/SW Discharge Note   Patient Details  Name: [name] MRN: [id number] Date of Birth: 1966/07/02  [facility] of [facility] CM/SW Contact:  Lennart Pall, LCSW Phone Number: 08/13/2022, 2:40 PM   Clinical Narrative:    Have reviewed SNF bed offers with pt and she has chosen [facility] and have received insurance authorization.  Pt agreeable with plan to admit to facility today.  PTAR called at 2:15pm.  RN to call report to [phone].  No further TOC needs.   Final next level of care: Skilled Nursing Facility Barriers to Discharge: Barriers Resolved   Patient Goals and CMS Choice Patient states their goals for this hospitalization and ongoing recovery are:: To go to SNF and then go home CMS Medicare.gov Compare Post Acute Care list provided to:: Patient Choice offered to / list presented to : Patient, Adult Children  Discharge Placement PASRR number recieved: 08/11/22            Patient chooses bed at: [facility] Patient to be transferred to facility by: [place] Name of family member notified: daughter Patient and family notified of of transfer: 08/13/22  Discharge Plan and Services In-house Referral: NA Discharge Planning Services: CM Consult Post Acute Care Choice: [place]          DME Arranged: N/A DME Agency: NA                  Social Determinants of Health (SDOH) Interventions     Readmission Risk Interventions    08/11/2022    3:44 PM  Readmission Risk Prevention Plan  Post Dischage Appt Complete  Medication Screening Complete  Transportation Screening Complete

## 2022-08-13 NOTE — Progress Notes (Signed)
Physical Therapy Treatment Patient Details Name: [name] MRN: [id number] DOB: 01/11/1966 Today's Date: 08/13/2022   History of Present Illness 56 yo female s/p Right knee arthroscopy with limited synovectomy, medial meniscus root repair, chondroplasty medial femoral condyle and trochlea, and  partial lateral meniscectomy. PMH: depression, back surgery, fibromyalgia    PT Comments    POD # 3 AxO x 3 very pleasant and motivated however withy increased pain since yesterday's PT session.  Pt stated she noticed increased pain during "knee presses" in back of her knee.  Pt asked NOT to perform any TE's today.  Assisted OOB to amb around room only due to 10/10 pain despite having been premedicated.  Assisted back to bed and applied ICE under and B sides of knee. Pt will need ST Rehab at SNF to address mobility and functional decline prior to safely returning home.    Recommendations for follow up therapy are one component of a multi-disciplinary discharge planning process, led by the attending physician.  Recommendations may be updated based on patient status, additional functional criteria and insurance authorization.  Follow Up Recommendations  Skilled nursing-short term rehab (<3 hours/day)     Assistance Recommended at Discharge Intermittent Supervision/Assistance  Patient can return home with the following A little help with bathing/dressing/bathroom;Assistance with cooking/housework;Assist for transportation;Help with stairs or ramp for entrance   Equipment Recommendations       Recommendations for Other Services       Precautions / Restrictions Precautions Precautions: Fall;Knee Precaution Comments: Avoid knee flexion past 90 degrees for 6 weeks, per Ortho MD Restrictions Weight Bearing Restrictions: Yes RLE Weight Bearing: Touchdown weight bearing     Mobility  Bed Mobility Overal bed mobility: Needs Assistance Bed Mobility: Supine to Sit     Supine to  sit: Supervision, Min guard     General bed mobility comments: required increased time and effort    Transfers Overall transfer level: Needs assistance Equipment used: Rolling walker (2 wheels) Transfers: Sit to/from Stand Sit to Stand: Supervision, Min guard           General transfer comment: cues for hand placement and RLE position.  Instability due to limited TTWBing.    Ambulation/Gait Ambulation/Gait assistance: Min guard, Min assist Gait Distance (Feet): 18 Feet Assistive device: Rolling walker (2 wheels) Gait Pattern/deviations: Step-to pattern Gait velocity: decreased     General Gait Details: limited distance in room due to increased knee pain with activity.  10/10.  Also c/o L posterior shoulder pain with use of walker.   Stairs             Wheelchair Mobility    Modified Rankin (Stroke Patients Only)       Balance                                            Cognition Arousal/Alertness: Awake/alert Behavior During Therapy: WFL for tasks assessed/performed Overall Cognitive Status: Within Functional Limits for tasks assessed                                 General Comments: AxO x 3 stated she had increased knee pain with performing lnee presses/extension during yesterday's PT session        Exercises      General Comments  Pertinent Vitals/Pain Pain Assessment Pain Assessment: Faces Faces Pain Scale: Hurts even more Pain Location: right knee Pain Descriptors / Indicators: Aching, Guarding, Grimacing, Sore Pain Intervention(s): Monitored during session, Repositioned, Ice applied    Home Living                          Prior Function            PT Goals (current goals can now be found in the care plan section) Progress towards PT goals: Progressing toward goals    Frequency    7X/week      PT Plan Current plan remains appropriate    Co-evaluation               AM-PAC PT "6 Clicks" Mobility   Outcome Measure  Help needed turning from your back to your side while in a flat bed without using bedrails?: A Little Help needed moving from lying on your back to sitting on the side of a flat bed without using bedrails?: A Little Help needed moving to and from a bed to a chair (including a wheelchair)?: A Little Help needed standing up from a chair using your arms (e.g., wheelchair or bedside chair)?: A Little Help needed to walk in hospital room?: A Lot Help needed climbing 3-5 steps with a railing? : Total 6 Click Score: 15    End of Session Equipment Utilized During Treatment: Gait belt Activity Tolerance: Patient tolerated treatment well Patient left: with call bell/phone within reach;with chair alarm set;in bed Nurse Communication: Mobility status PT Visit Diagnosis: Other abnormalities of gait and mobility (R26.89);Difficulty in walking, not elsewhere classified (R26.2)     Time: 1030-1045 PT Time Calculation (min) (ACUTE ONLY): 15 min  Charges:  $Gait Training: 8-22 mins                     {Romanda Turrubiates  PTA Acute  [employer] M-F          [phone] Weekend pager [phone]

## 2022-08-13 NOTE — Discharge Summary (Signed)
Patient ID: [name] MRN: [id number] DOB/AGE: 1966-07-21 56 y.o.  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   Discharge Diagnoses:   Principal Problem:   S/P medial meniscus repair of right knee  Estimated body mass index is 37.49 kg/m as calculated from the following:   Height as of this encounter: 5' 3.5" (1.613 m).   Weight as of this encounter: 97.5 kg.  Procedure:  Procedure(s) (LRB): KNEE ARTHROSCOPY WITH ROOT MEDIAL MENISCAL REPAIR (Right)   Consults: None  HPI: [name] is a 56 year old female who presented to our office due to knee pain and swelling, MRI revealed a medial meniscus root tear. This was indicated for surgical intervention to improve symptoms and help prevent rapid progression of osteoarthritis. She underwent repair on 08/10/22 and was admitted for post operative pain management and PT.   Laboratory Data: Admission on 08/10/2022  Component Date Value Ref Range Status   HIV Screen 4th Generation wRfx 08/10/2022 Non Reactive  Non Reactive Final   Performed at [facility], [address], [place], [place] [address]   WBC 08/10/2022 10.6 (H)  4.0 - 10.5 K/uL Final   RBC 08/10/2022 4.38  3.87 - 5.11 MIL/uL Final   Hemoglobin 08/10/2022 13.6  12.0 - 15.0 g/dL Final   HCT 08/10/2022 41.6  36.0 - 46.0 % Final   MCV 08/10/2022 95.0  80.0 - 100.0 fL Final   MCH 08/10/2022  31.1  26.0 - 34.0 pg Final   MCHC 08/10/2022 32.7  30.0 - 36.0 g/dL Final   RDW 08/10/2022 13.1  11.5 - 15.5 % Final   Platelets 08/10/2022 316  150 - 400 K/uL Final   nRBC 08/10/2022 0.0  0.0 - 0.2 % Final   Performed at [facility], [place] [address] [place], [place] [address]   Creatinine, Ser 08/10/2022 0.81  0.44 - 1.00 mg/dL Final   GFR, Estimated 08/10/2022 >60  >60 mL/min Final   Comment: (NOTE) Calculated using the CKD-EPI Creatinine Equation (2021) Performed at [facility], [place] [address], [place], [place] [address]   Hospital Outpatient Visit on 08/06/2022  Component Date Value Ref Range Status   Sodium 08/06/2022 140  135 - 145 mmol/L Final   Potassium 08/06/2022 4.9  3.5 - 5.1 mmol/L Final   Chloride 08/06/2022 107  98 - 111 mmol/L Final   CO2 08/06/2022 28  22 - 32 mmol/L Final   Glucose, Bld 08/06/2022 129 (H)  70 - 99 mg/dL Final   Glucose reference range applies only to samples taken after fasting for at least 8 hours.   BUN 08/06/2022 19  6 - 20 mg/dL Final   Creatinine, Ser 08/06/2022 0.66  0.44 - 1.00 mg/dL Final   Calcium 08/06/2022 9.0  8.9 - 10.3 mg/dL Final  GFR, Estimated 08/06/2022 >60  >60 mL/min Final   Comment: (NOTE) Calculated using the CKD-EPI Creatinine Equation (2021)    Anion gap 08/06/2022 5  5 - 15 Final   Performed at [facility], [place] [address] [place], [place] [address]   WBC 08/06/2022 10.4  4.0 - 10.5 K/uL Final   RBC 08/06/2022 4.28  3.87 - 5.11 MIL/uL Final   Hemoglobin 08/06/2022 13.5  12.0 - 15.0 g/dL Final   HCT 08/06/2022 41.2  36.0 - 46.0 % Final   MCV 08/06/2022 96.3  80.0 - 100.0 fL Final   MCH 08/06/2022 31.5  26.0 - 34.0 pg Final   MCHC 08/06/2022 32.8  30.0 - 36.0 g/dL Final   RDW 08/06/2022 13.0  11.5 - 15.5 % Final   Platelets 08/06/2022 294  150 - 400 K/uL Final   nRBC 08/06/2022 0.0  0.0 - 0.2 % Final   Performed at [facility], [place]  [address] [place], [place] [address]  Office Visit on 07/13/2022  Component Date Value Ref Range Status   Sodium 07/13/2022 140  135 - 145 mEq/L Final   Potassium 07/13/2022 4.8  3.5 - 5.1 mEq/L Final   Chloride 07/13/2022 105  96 - 112 mEq/L Final   CO2 07/13/2022 27  19 - 32 mEq/L Final   Glucose, Bld 07/13/2022 118 (H)  70 - 99 mg/dL Final   BUN 07/13/2022 16  6 - 23 mg/dL Final   Creatinine, Ser 07/13/2022 0.73  0.40 - 1.20 mg/dL Final   GFR 07/13/2022 90.45  >60.00 mL/min Final   Calculated using the CKD-EPI Creatinine Equation (2021)   Calcium 07/13/2022 9.7  8.4 - 10.5 mg/dL Final   VITD 07/13/2022 29.74 (L)  30.00 - 100.00 ng/mL Final   Hgb A1c MFr Bld 07/13/2022 6.1  4.6 - 6.5 % Final   Glycemic Control Guidelines for People with Diabetes:Non Diabetic:  <6%Goal of Therapy: <7%Additional Action Suggested:  >8%      X-Rays:No results found.  EKG: Orders placed or performed during the hospital encounter of 08/06/22   EKG 12 lead per protocol   EKG 12 lead per protocol     Hospital Course: [name] is a 56 y.o. who was admitted to Hospital. They were brought to the operating room on 08/10/2022 and underwent Procedure(s): KNEE ARTHROSCOPY WITH ROOT MEDIAL MENISCAL REPAIR.  Patient tolerated the procedure well and was later transferred to the recovery room and then to the orthopaedic floor for postoperative care.  They were given PO and IV analgesics for pain control following their surgery.  They were given 24 hours of postoperative antibiotics of  Anti-infectives (From admission, onward)    Start     Dose/Rate Route Frequency Ordered Stop   08/10/22 1200  ceFAZolin (ANCEF) IVPB 2g/100 mL premix        2 g 200 mL/hr over 30 Minutes Intravenous On call to O.R. 08/10/22 1156 08/10/22 1410      and started on DVT prophylaxis in the form of Lovenox.   PT was ordered for rehabilitation and safe ambulation.  Discharge planning consulted to help with postop disposition  and equipment needs.  Patient had an uneventful night on the evening of surgery.  They started to get up OOB with therapy on day one. PT evaluation noted poor ambulation and difficulty with ADL's due to post operative restrictions. Patient lives alone and does not have significant help 24 hours. She was recommended SNF placement.  Continued to work with therapy into day two.    By day three, Patient was seen in rounds and was ready to go to a SNF.    Diet: Regular diet Activity:Touchdown weightbearing to RLE, ok for flexion up to 90 degrees active and passive while supine or seated.  Follow-up:in 2 weeks Disposition - Skilled nursing facility Discharged Condition: good DVT prophylaxis: Aspirin '81mg'$  daily.   Dressings : Dressings can be removed after 3 days post operatively. Steristrips will stay attached. No submerging the dressing but showers are ok.    Discharge Instructions     Call MD / Call 911   Complete by: As directed    If you experience chest pain or shortness of breath, CALL 911 and be transported to the hospital emergency room.  If you develope a fever above 101 F, pus (white drainage) or increased drainage or redness at the wound, or calf pain, call your surgeon's office.   Constipation Prevention   Complete by: As directed    Drink plenty of fluids.  Prune juice may be helpful.  You may use a stool softener, such as Colace (over the counter) 100 mg twice a day.  Use MiraLax (over the counter) for constipation as needed.   Diet - low sodium heart healthy   Complete by: As directed    Increase activity slowly as tolerated   Complete by: As directed    Post-operative opioid taper instructions:   Complete by: As directed    POST-OPERATIVE OPIOID TAPER INSTRUCTIONS: It is important to wean off of your opioid medication as soon as possible. If you do not need pain medication after your surgery it is ok to stop day one. Opioids include: Codeine, Hydrocodone(Norco, Vicodin),  Oxycodone(Percocet, oxycontin) and hydromorphone amongst others.  Long term and even short term use of opiods can cause: Increased pain response Dependence Constipation Depression Respiratory depression And more.  Withdrawal symptoms can include Flu like symptoms Nausea, vomiting And more Techniques to manage these symptoms Hydrate well Eat regular healthy meals Stay active Use relaxation techniques(deep breathing, meditating, yoga) Do Not substitute Alcohol to help with tapering If you have been on opioids for less than two weeks and do not have pain than it is ok to stop all together.  Plan to wean off of opioids This plan should start within one week post op of your joint replacement. Maintain the same interval or time between taking each dose and first decrease the dose.  Cut the total daily intake of opioids by one tablet each day Next start to increase the time between doses. The last dose that should be eliminated is the evening dose.         Allergies as of 08/13/2022       Reactions   Pneumovax 23 [pneumococcal Vac Polyvalent] Other (See Comments)   Arm mass   Seroquel [quetiapine Fumarate] Nausea Only   Nausea, headache and lethargy        Medication List     TAKE these medications    acetaminophen 500 MG tablet Commonly known as: TYLENOL Take 1,000 mg by mouth every 6 (six) hours as needed for mild pain or moderate pain.   aspirin EC 81 MG tablet Take 81 mg by mouth in the morning and at bedtime.   Biotene Dry Mouth Gum Use as directed 1 each in the mouth or throat daily as needed (Dry mouth).   buprenorphine 15 MCG/HR Commonly known as: BUTRANS Place 1 patch onto the skin  once a week.   docusate sodium 100 MG capsule Commonly known as: COLACE Take 100 mg by mouth every evening.   DULoxetine 60 MG capsule Commonly known as: CYMBALTA Take 60 mg by mouth every evening.   fluticasone 50 MCG/ACT nasal spray Commonly known as: FLONASE SPRAY  2 SPRAYS INTO EACH NOSTRIL EVERY DAY What changed: See the new instructions.   gabapentin 600 MG tablet Commonly known as: NEURONTIN Take 1,200 mg by mouth 3 (three) times daily.   HYDROcodone-acetaminophen 5-325 MG tablet Commonly known as: NORCO/VICODIN Take 2 tablets by mouth every 4 (four) hours as needed. What changed:  how much to take when to take this   KANK-A MT Use as directed 1 Application in the mouth or throat as needed (oral sores).   meloxicam 15 MG tablet Commonly known as: MOBIC Take 15 mg by mouth in the morning.   methocarbamol 500 MG tablet Commonly known as: ROBAXIN Take 500 mg by mouth 4 (four) times daily.   multivitamin with minerals Tabs tablet Take 1 tablet by mouth daily. One A Day Multivitamin   ondansetron 4 MG tablet Commonly known as: Zofran Take 1 tablet (4 mg total) by mouth every 8 (eight) hours as needed.   oxyCODONE-acetaminophen 7.5-325 MG tablet Commonly known as: Percocet Take 1 tablet by mouth every 6 (six) hours as needed for severe pain.   senna-docusate 8.6-50 MG tablet Commonly known as: Senokot-S Take 1 tablet by mouth at bedtime as needed for mild constipation.   traZODone 100 MG tablet Commonly known as: DESYREL Take 100 mg by mouth at bedtime.        Follow-up Information     Nicholes Stairs, MD Follow up in 2 week(s).   Specialty: Orthopedic Surgery Contact information: [address] [place] [address] [phone]                 Signed: Jonelle Sidle PA-C  Orthopaedic Surgery 08/13/2022, 1:54 PM

## 2022-08-13 NOTE — Progress Notes (Signed)
   Subjective:  [name] is a 56 y.o. female, 3 Days Post-Op    s/p Procedure(s): KNEE ARTHROSCOPY WITH ROOT MEDIAL MENISCAL REPAIR   Patient reports pain as mild to moderate.  Reports that she was overall doing well over the weekend until on Sunday evening after therapy they are working on extension with a therapist and she had increased pain with this cause persistent pain and caused her to be tearful.  This has improved after pain medications this morning.  She reports otherwise she is doing well uses a walker to get up to the bathroom.  Denies numbness or tingling.  Denies fever or chills.  Denies abdominal pain.  Denies shortness of breath.  Objective:   VITALS:   Vitals:   08/12/22 0612 08/12/22 1405 08/12/22 2040 08/13/22 0521  BP: (!) 127/59 (!) 105/94 (!) 105/58 (!) 123/95  Pulse: 78 72 66 76  Resp: '16 17 18 20  '$ Temp: 98.5 F (36.9 C) 98.6 F (37 C) 98.5 F (36.9 C) 98 F (36.7 C)  TempSrc: Oral Oral Oral Oral  SpO2: 97% 99% 98% 99%  Weight:      Height:       In hospital bed, NAD  RLE:   Neurologically intact Neurovascular intact Sensation intact distally Intact pulses distally Dorsiflexion/Plantar flexion intact Incision: dressing C/D/I Compartment soft Soft dressing present, no drainage. Calves soft, nontender bilaterally.  Mild generalized anterior pain Lab Results  Component Value Date   WBC 10.6 (H) 08/10/2022   HGB 13.6 08/10/2022   HCT 41.6 08/10/2022   MCV 95.0 08/10/2022   PLT 316 08/10/2022   BMET    Component Value Date/Time   NA 140 08/06/2022 1405   K 4.9 08/06/2022 1405   CL 107 08/06/2022 1405   CO2 28 08/06/2022 1405   GLUCOSE 129 (H) 08/06/2022 1405   BUN 19 08/06/2022 1405   CREATININE 0.81 08/10/2022 1929   CALCIUM 9.0 08/06/2022 1405   GFRNONAA >60 08/10/2022 1929     Assessment/Plan: 3 Days Post-Op   Principal Problem:   S/P medial meniscus repair of right knee   Advance diet Up with therapy  Dispo:  SNF, awaiting placement.   Weightbearing Status: TDWB Right lower extremity, ambulate with walker, ok for ROM avoiding deep flexion past 90 degrees for first 6 weeks.   Dressing can be removed this evening or tomorrow, recommend putting Ace wrap back on for compression if/when this is removed  DVT Prophylaxis: Lovenox inpatient, '81mg'$  of aspirin outpatient   Pain medication printed for SNF discharge once ready.  Faythe Casa 08/13/2022, 7:49 AM  Jonelle Sidle PA-C  Physician Assistant with Dr. Lillia Abed [place]

## 2022-08-15 ENCOUNTER — Other Ambulatory Visit: Payer: Self-pay | Admitting: *Deleted

## 2022-08-15 DIAGNOSIS — M47819 Spondylosis without myelopathy or radiculopathy, site unspecified: Secondary | ICD-10-CM

## 2022-08-15 DIAGNOSIS — M25561 Pain in right knee: Secondary | ICD-10-CM | POA: Diagnosis not present

## 2022-08-15 NOTE — Patient Outreach (Addendum)
Per [facility], [name] resides in [facility]. Screening for potential [facility] care coordination services as benefit of insurance plan and PCP.   Facility site visit to [facility] today. Writer informed [name] left AMA on yesterday, 08/14/22, after being there for 1 day. Home health was not arranged.   Telephone call made to [name] [phone]. Patient identifiers confirmed. [name] confirms she left the facility yesterday. States the experience was "horrid." States she called surgeon's office to make aware she left and that she will need home health. Confirmed surgeon is Dr. Stann Mainland with [facility]. [name] also reports she will need Morgan walker and shower stool. Endorses she lives with roommate who cooks. States her daughter takes her to all of her appointments. Denies any needs with meals and transportation.   Explained [facility] care coordination follow up. [name] agreeable.  Writer contacted [facility], spoke with Abby, to make aware [name] left SNF AMA and will need home health PT/OT, Morgan walker, and shower stool.   Will make referral to [facility] team for care coordination.   [name] is s/p Right medial meniscus root repair on 08/10/22.  Gail Rolling, MSN, RN,BSN [place] Acute Care Coordinator [phone] (Direct dial)

## 2022-08-16 ENCOUNTER — Telehealth: Payer: Self-pay | Admitting: *Deleted

## 2022-08-16 NOTE — Progress Notes (Signed)
  Care Coordination   Note   08/16/2022 Name: [name] MRN: [id number] DOB: 14-Nov-1965  [name] is a 56 y.o. year old female who sees [name], [name], MD for primary care. I reached out to [employer] by phone today to offer care coordination services.  Received referral   [name] was given information about Care Coordination services today including:   The Care Coordination services include support from the care team which includes your Nurse Coordinator, Clinical Social Worker, or Pharmacist.  The Care Coordination team is here to help remove barriers to the health concerns and goals most important to you. Care Coordination services are voluntary, and the patient may decline or stop services at any time by request to their care team member.   Care Coordination Consent Status: Patient agreed to services and verbal consent obtained.   Follow up plan:  Telephone appointment with care coordination team member scheduled for:  08/17/2022  Encounter Outcome:  Pt. Scheduled from referral   Julian Hy, [place] Direct Dial: [phone]

## 2022-08-17 ENCOUNTER — Ambulatory Visit: Payer: Self-pay | Admitting: *Deleted

## 2022-08-17 ENCOUNTER — Encounter: Payer: Self-pay | Admitting: *Deleted

## 2022-08-17 NOTE — Patient Instructions (Signed)
Visit Information  Thank you for taking time to visit with me today. Please don't hesitate to contact me if I can be of assistance to you.   Following are the goals we discussed today:   Goals Addressed               This Visit's Progress     Shower chair (pt-stated)        Care Coordination Interventions: Provided education to patient and/or caregiver about advanced directives Reviewed medications with patient and discussed adherence with all medications with no needed refills Reviewed scheduled/upcoming provider appointments including pending appointments with sufficient transportation Screening for signs and symptoms of depression related to chronic disease state  Assessed social determinant of health barriers Offered to collaborate with provider for possible order to local DME agency for shower chair however if not covered by insurance carrier maybe an out-of-pocket expense. Provided some local resources [employer] or [employer] stores that may have this item for a lesser expense. Pt declined assistance with this task indicating she would get her daughter to get her one. No other needs presented at this time for case management services.         Please call the care guide team at [phone] if you need to cancel or reschedule your appointment.   If you are experiencing a Mental Health or [place] or need someone to talk to, please call the Suicide and Crisis Lifeline: 988  The patient verbalized understanding of instructions, educational materials, and care plan provided today and DECLINED offer to receive copy of patient instructions, educational materials, and care plan.   No further follow up required: No further follow up needs  [name], RN Care Management Coordinator [place] Office [phone]

## 2022-08-17 NOTE — Patient Outreach (Signed)
  Care Coordination   Initial Visit Note   08/17/2022 Name: [name] MRN: [id number] DOB: January 01, 1966  [name] [name] is a 56 y.o. year old female who sees [name], Gail So, MD for primary care. I spoke with  [name] by phone today.  What matters to the patients health and wellness today?  Shower chair    Goals Addressed               This Visit's Progress     Shower chair (pt-stated)        Care Coordination Interventions: Provided education to patient and/or caregiver about advanced directives Reviewed medications with patient and discussed adherence with all medications with no needed refills Reviewed scheduled/upcoming provider appointments including pending appointments with sufficient transportation Screening for signs and symptoms of depression related to chronic disease state  Assessed social determinant of health barriers Offered to collaborate with provider for possible order to local DME agency for shower chair however if not covered by insurance carrier maybe an out-of-pocket expense. Provided some local resources [employer] or [employer] stores that may have this item for a lesser expense. Pt declined assistance with this task indicating she would get her daughter to get her one. No other needs presented at this time for case management services.         SDOH assessments and interventions completed:  Yes  SDOH Interventions Today    Flowsheet Row Most Recent Value  SDOH Interventions   Food Insecurity Interventions Intervention Not Indicated  Housing Interventions Intervention Not Indicated  Transportation Interventions Intervention Not Indicated  Utilities Interventions Intervention Not Indicated        Care Coordination Interventions Activated:  Yes  Care Coordination Interventions:  Yes, provided   Follow up plan: No further intervention required.   Encounter Outcome:  Pt. Visit Completed   [name], RN Care  Management Coordinator [place] Office [phone]

## 2022-08-21 ENCOUNTER — Encounter: Payer: Self-pay | Admitting: Family Medicine

## 2022-08-21 ENCOUNTER — Telehealth (INDEPENDENT_AMBULATORY_CARE_PROVIDER_SITE_OTHER): Payer: Medicare Other | Admitting: Family Medicine

## 2022-08-21 VITALS — Wt 215.0 lb

## 2022-08-21 DIAGNOSIS — K5900 Constipation, unspecified: Secondary | ICD-10-CM | POA: Diagnosis not present

## 2022-08-21 DIAGNOSIS — N3000 Acute cystitis without hematuria: Secondary | ICD-10-CM

## 2022-08-21 MED ORDER — CIPROFLOXACIN HCL 500 MG PO TABS: 500.0000 mg | ORAL_TABLET | Freq: Two times a day (BID) | ORAL | 0 refills | Status: AC

## 2022-08-21 NOTE — Progress Notes (Signed)
   Established Patient Office Visit  Subjective   Patient ID: [name], female    DOB: [birth year]  Age: 56 y.o. MRN: [id number]  Chief Complaint  Patient presents with   Fever    Temperature of 100.5 degrees x2 days   Pain    Patient complains of pain above the pubic bone when coughing, blowing nose and with bowel movements x3 days, denies pain with urination    I connected with  [name] on 08/21/22 by a video enabled telemedicine application and verified that I am speaking with the correct person using two identifiers.   I discussed the limitations of evaluation and management by telemedicine. The patient expressed understanding and agreed to proceed.   Patient location: home address Provider location: [place]  Patient states that she started with pain above the pubic bone for the last 3 days, then last night she had a fever of 100.5.  States she recently had a surgery for a tknee meniscal repair on 08/10/22 and is on narcotics for postoperative pain relief. Pt reports that the pain is getting worse in her pelvic area. Pt states she is passing gas and she is stooling but its only a small amount each time, states it is like pebbles. She has been taking colace twice a day but it didn't seem to be helping. States she did take miralax last night and today has had stools but in small quantity. States that she has to hold her stomach when she coughs. States that she feels like her bladder is very tender. No burning with urination, no nausea or vomiting.       Review of Systems  All other systems reviewed and are negative.     Objective:     Wt 215 lb (97.5 kg)   LMP 07/17/2014 Comment: Pt stated that she is going through menopause and that she will go for 6-8 months without a period and then have it twice a month.   BMI 37.49 kg/m    Physical Exam Constitutional:      Appearance: Normal appearance.  Pulmonary:     Effort: Pulmonary effort  is normal.  Neurological:     Mental Status: She is alert and oriented to person, place, and time. Mental status is at baseline.  Psychiatric:        Mood and Affect: Mood normal.        Behavior: Behavior normal.      No results found for any visits on 08/21/22.    The 10-year ASCVD risk score (Arnett DK, et al., 2019) is: 6.9%    Assessment & Plan:   Problem List Items Addressed This Visit   None Visit Diagnoses     Acute cystitis without hematuria    -  Primary   Relevant Medications   Possible diagnosis, the fever seems to be the most concerning symptom. I think that most of her symptoms are related to the acute constipation, however with the reported fever I would favor treating empirically with cipro 500 mg BID for 5 days.  ciprofloxacin (CIPRO) 500 MG tablet   Acute constipation      I recommended OTC magnesium citrate solution orally to get a normal BM. Follow up as needed.      No follow-ups on file.    Farrel Conners, MD

## 2022-08-22 ENCOUNTER — Other Ambulatory Visit: Payer: Self-pay | Admitting: Family Medicine

## 2022-09-10 NOTE — Progress Notes (Unsigned)
HPI: Ms.Leaann Ragene Casciano is a 56 y.o. female with PMHx significant for chronic pain, fibromyalgia, hyperlipidemia, vitamin D deficiency, anxiety, and depression here today c/o abdominal pain. Pain started one week before Thanksgiving. She had a video visit for same problem on 08/21/22, treated with cipro 500 mg bid x 5 d for possible cystitis, did not help with pain and gradually getting worse. Constant dull mild and intermittent sharp with certain activities.  She had a fever of 101F one day and 100.65F the next day during the initial onset of the pain, not sure of this was related to abdominal pain or another acute illness. Abdominal Pain This is a new problem. The current episode started 1 to 4 weeks ago. The onset quality is gradual. The problem occurs constantly. The pain is located in the suprapubic region. The pain is at a severity of 9/10. The pain is severe. The quality of the pain is dull. The abdominal pain does not radiate. Associated symptoms include arthralgias (chronic), constipation and myalgias. Pertinent negatives include no anorexia, belching, diarrhea, dysuria, fever, flatus, frequency, headaches, hematochezia, hematuria, melena, nausea, vomiting or weight loss. The pain is relieved by Nothing. She has tried antibiotics for the symptoms.  The pain intensifies when she stands, sits, and walks.  She denies any vaginal discharge, vaginal bleeding, or urinary symptoms.   Chronic pain on Buprenorphine patch, percocet, gabapentin, and  Meloxicam and still having severe abdominal pain. Constipation is chronic and unchanged, she takes Miralax.  She reports no change in pain when eating, passing gas, or defecation. She had a colonoscopy in April 2023, which was negative, with a few polyps removed. Her mammogram and gyn exam/pap smear 03/2022, reported as normal. Lab Results  Component Value Date   CREATININE 0.81 08/10/2022   BUN 19 08/06/2022   NA 140 08/06/2022   K 4.9  08/06/2022   CL 107 08/06/2022   CO2 28 08/06/2022   Lab Results  Component Value Date   WBC 10.6 (H) 08/10/2022   HGB 13.6 08/10/2022   HCT 41.6 08/10/2022   MCV 95.0 08/10/2022   PLT 316 08/10/2022   She had right knee arthroscopic surgery on 08/10/22, recovering well.  She is also interested in lung cancer screening. According records she smokes 11.5 pack per year but she started that she has smoked for about 30-31 years 1 PPD. Has tried to quit a few times unsuccessful.  Review of Systems  Constitutional:  Positive for activity change and fatigue. Negative for appetite change, fever and weight loss.  Respiratory:  Negative for cough, shortness of breath and wheezing.   Gastrointestinal:  Positive for abdominal pain and constipation. Negative for anorexia, diarrhea, flatus, hematochezia, melena, nausea and vomiting.  Genitourinary:  Negative for dysuria, frequency and hematuria.  Musculoskeletal:  Positive for arthralgias (chronic) and myalgias.  Neurological:  Negative for syncope and headaches.  Psychiatric/Behavioral:  Negative for confusion. The patient is nervous/anxious.   See other pertinent positives and negatives in HPI.  Current Outpatient Medications on File Prior to Visit  Medication Sig Dispense Refill   acetaminophen (TYLENOL) 500 MG tablet Take 1,000 mg by mouth every 6 (six) hours as needed for mild pain or moderate pain.     antiseptic oral rinse (BIOTENE) LIQD 15 mLs by Mouth Rinse route as needed for dry mouth.     aspirin EC 81 MG tablet Take 81 mg by mouth in the morning and at bedtime.     Benzocaine (KANK-A MT) Use as directed 1  Application in the mouth or throat as needed (oral sores).     buprenorphine (BUTRANS) 15 MCG/HR Place 1 patch onto the skin once a week.     docusate sodium (COLACE) 100 MG capsule Take 100 mg by mouth 2 (two) times daily.     DULoxetine (CYMBALTA) 60 MG capsule Take 60 mg by mouth every evening.     fluticasone (FLONASE) 50  MCG/ACT nasal spray SPRAY 2 SPRAYS INTO EACH NOSTRIL EVERY DAY 16 mL 1   gabapentin (NEURONTIN) 600 MG tablet Take 1,200 mg by mouth 3 (three) times daily.     meloxicam (MOBIC) 15 MG tablet Take 15 mg by mouth in the morning.     methocarbamol (ROBAXIN) 500 MG tablet Take 500 mg by mouth 4 (four) times daily.     Multiple Vitamin (MULTIVITAMIN WITH MINERALS) TABS tablet Take 1 tablet by mouth daily. One A Day Multivitamin     ondansetron (ZOFRAN) 4 MG tablet Take 1 tablet (4 mg total) by mouth every 8 (eight) hours as needed. 20 tablet 0   oxyCODONE-acetaminophen (PERCOCET) 7.5-325 MG tablet Take 1 tablet by mouth every 6 (six) hours as needed for severe pain. 20 tablet 0   traZODone (DESYREL) 100 MG tablet Take 100 mg by mouth at bedtime.     No current facility-administered medications on file prior to visit.   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   Allergies  Allergen Reactions   Pneumovax 23 [Pneumococcal Vac Polyvalent] Other (See Comments)    Arm mass   Seroquel [Quetiapine Fumarate] Nausea Only    Nausea, headache and lethargy   Social History   Socioeconomic History   Marital status: Divorced    Spouse name: Not on file   Number of children: 3   Years of education: Not on file   Highest education level: Not on file  Occupational History   Not on file  Tobacco Use   Smoking status: Every Day    Packs/day: 1.00    Years: 30.00    Total pack years: 30.00    Types: Cigarettes    Start date: 31    Passive exposure: Current   Smokeless tobacco: Never  Vaping Use    Vaping Use: Never used  Substance and Sexual Activity   Alcohol use: No   Drug use: No   Sexual activity: Not Currently  Other Topics Concern   Not on file  Social History Narrative   Not on file   Social Determinants of Health   Financial Resource Strain: Low Risk  (02/12/2022)   Overall Financial Resource Strain (CARDIA)    Difficulty of Paying Living Expenses: Not hard at all  Food Insecurity: No Food Insecurity (08/17/2022)   Hunger Vital Sign    Worried About Running Out of Food in the Last Year: Never true    Ran Out of Food in the Last Year: Never true  Transportation Needs: No Transportation Needs (08/17/2022)  PRAPARE - Hydrologist (Medical): No    Lack of Transportation (Non-Medical): No  Physical Activity: Insufficiently Active (02/12/2022)   Exercise Vital Sign    Days of Exercise per Week: 4 days    Minutes of Exercise per Session: 30 min  Stress: Stress Concern Present (02/12/2022)   [place]    Feeling of Stress : To some extent  Social Connections: Moderately Isolated (02/07/2021)   Social Connection and Isolation Panel [NHANES]    Frequency of Communication with Friends and Family: More than three times a week    Frequency of Social Gatherings with Friends and Family: More than three times a week    Attends Religious Services: More than 4 times per year    Active Member of [employer] or Organizations: No    Attends Archivist Meetings: Never    Marital Status: Divorced   Vitals:   09/11/22 1521  BP: 128/80  Pulse: 96  Resp: 16  SpO2: 98%  Body mass index is 37.75 kg/m.  Physical Exam Vitals and nursing note reviewed.  Constitutional:      General: She is not in acute distress.    Appearance: She is well-developed.  HENT:     Head: Normocephalic and atraumatic.     Mouth/Throat:     Mouth: Mucous membranes are moist.     Pharynx: Oropharynx is  clear.  Eyes:     Conjunctiva/sclera: Conjunctivae normal.  Cardiovascular:     Rate and Rhythm: Normal rate and regular rhythm.     Pulses:          Dorsalis pedis pulses are 2+ on the right side and 2+ on the left side.     Heart sounds: No murmur heard. Pulmonary:     Effort: Pulmonary effort is normal. No respiratory distress.     Breath sounds: Normal breath sounds.  Abdominal:     Palpations: Abdomen is soft. There is no mass.     Tenderness: There is abdominal tenderness in the suprapubic area and left lower quadrant. There is no guarding or rebound.  Lymphadenopathy:     Cervical: No cervical adenopathy.  Skin:    General: Skin is warm.     Findings: No erythema or rash.  Neurological:     General: No focal deficit present.     Mental Status: She is alert and oriented to person, place, and time.     Cranial Nerves: No cranial nerve deficit.     Comments: Antalgic gait assisted with crutches.  Psychiatric:        Mood and Affect: Mood is anxious.   ASSESSMENT AND PLAN:  Ms.Asami was seen today for follow-up.  Diagnoses and all orders for this visit: Orders Placed This Encounter  Procedures   CT Abdomen Pelvis W Contrast   Basic metabolic panel   CBC   Ambulatory Referral for Lung Cancer Scre   Lab Results  Component Value Date   WBC 10.6 (H) 08/10/2022   HGB 13.6 08/10/2022   HCT 41.6 08/10/2022   MCV 95.0 08/10/2022   PLT 316 08/10/2022   Lab Results  Component Value Date   CREATININE 0.81 08/10/2022   BUN 19 08/06/2022   NA 140 08/06/2022   K 4.9 08/06/2022   CL 107 08/06/2022   CO2 28 08/06/2022   Abdominal pain, unspecified abdominal location We discussed possible etiologies. Pain is getting worse, on examination today pain elicited  with palpation of suprapubic and LLQ area. ? IBS-constipation,diverticulosis,and uro-gyn etiology among some to consider. Reports hx of microscopic hematuria, completed urologic work-up and reported as negative. No  urinary symptoms. Abdominal/pelvic CT will be arranged. Clearly instructed about warning signs.  Screening for lung cancer -     Ambulatory Referral for Lung Cancer Scre  I spent a total of 32 minutes in both face to face and non face to face activities for this visit on the date of this encounter. During this time history was obtained and documented, examination was performed, prior labs reviewed, and assessment/plan discussed.  Return if symptoms worsen or fail to improve.  Diara Chaudhari G. [name], MD  [facility]. [place] office.

## 2022-09-11 ENCOUNTER — Encounter: Payer: Self-pay | Admitting: Family Medicine

## 2022-09-11 ENCOUNTER — Ambulatory Visit (INDEPENDENT_AMBULATORY_CARE_PROVIDER_SITE_OTHER): Payer: Medicare Other | Admitting: Family Medicine

## 2022-09-11 VITALS — BP 128/80 | HR 96 | Resp 16 | Ht 63.5 in | Wt 216.5 lb

## 2022-09-11 DIAGNOSIS — Z122 Encounter for screening for malignant neoplasm of respiratory organs: Secondary | ICD-10-CM

## 2022-09-11 DIAGNOSIS — R109 Unspecified abdominal pain: Secondary | ICD-10-CM | POA: Diagnosis not present

## 2022-09-11 DIAGNOSIS — E875 Hyperkalemia: Secondary | ICD-10-CM

## 2022-09-11 NOTE — Patient Instructions (Signed)
A few things to remember from today's visit:  Screening for lung cancer - Plan: Ambulatory Referral for Lung Cancer Scre  Abdominal pain, unspecified abdominal location - Plan: CT Abdomen Pelvis W Contrast, Basic metabolic panel, CBC Abdominal Pain, Adult Many things can cause belly (abdominal) pain. Most times, belly pain is not dangerous. Many cases of belly pain can be watched and treated at home. Sometimes, though, belly pain is serious. Your doctor will try to find the cause of your belly pain. Follow these instructions at home:  Medicines Take over-the-counter and prescription medicines only as told by your doctor. Do not take medicines that help you poop (laxatives) unless told by your doctor. General instructions Watch your belly pain for any changes. Drink enough fluid to keep your pee (urine) pale yellow. Keep all follow-up visits as told by your doctor. This is important. Contact a doctor if: Your belly pain changes or gets worse. You are not hungry, or you lose weight without trying. You are having trouble pooping (constipated) or have watery poop (diarrhea) for more than 2-3 days. You have pain when you pee or poop. Your belly pain wakes you up at night. Your pain gets worse with meals, after eating, or with certain foods. You are vomiting and cannot keep anything down. You have a fever. You have blood in your pee. Get help right away if: Your pain does not go away as soon as your doctor says it should. You cannot stop vomiting. Your pain is only in areas of your belly, such as the right side or the left lower part of the belly. You have bloody or black poop, or poop that looks like tar. You have very bad pain, cramping, or bloating in your belly. You have signs of not having enough fluid or water in your body (dehydration), such as: Dark pee, very little pee, or no pee. Cracked lips. Dry mouth. Sunken eyes. Sleepiness. Weakness. You have trouble breathing or chest  pain. Summary Many cases of belly pain can be watched and treated at home. Watch your belly pain for any changes. Take over-the-counter and prescription medicines only as told by your doctor. Contact a doctor if your belly pain changes or gets worse. Get help right away if you have very bad pain, cramping, or bloating in your belly. This information is not intended to replace advice given to you by your health care provider. Make sure you discuss any questions you have with your health care provider. Document Revised: 01/26/2019 Document Reviewed: 01/26/2019 Elsevier Patient Education  [place].  If you need refills for medications you take chronically, please call your pharmacy. Do not use My Chart to request refills or for acute issues that need immediate attention. If you send a my chart message, it may take a few days to be addressed, specially if I am not in the office.  Please be sure medication list is accurate. If a new problem present, please set up appointment sooner than planned today.

## 2022-09-12 LAB — CBC
HCT: 41.2 % (ref 36.0–46.0)
Hemoglobin: 13.8 g/dL (ref 12.0–15.0)
MCHC: 33.6 g/dL (ref 30.0–36.0)
MCV: 95.9 fl (ref 78.0–100.0)
Platelets: 348 10*3/uL (ref 150.0–400.0)
RBC: 4.3 Mil/uL (ref 3.87–5.11)
RDW: 13.1 % (ref 11.5–15.5)
WBC: 9.6 10*3/uL (ref 4.0–10.5)

## 2022-09-12 LAB — BASIC METABOLIC PANEL
BUN: 19 mg/dL (ref 6–23)
CO2: 32 mEq/L (ref 19–32)
Calcium: 10.2 mg/dL (ref 8.4–10.5)
Chloride: 103 mEq/L (ref 96–112)
Creatinine, Ser: 0.75 mg/dL (ref 0.40–1.20)
GFR: 87.46 mL/min (ref >60.00)
Glucose, Bld: 98 mg/dL (ref 70–99)
Potassium: 5.8 mEq/L — ABNORMAL HIGH (ref 3.5–5.1)
Sodium: 142 mEq/L (ref 135–145)

## 2022-09-13 ENCOUNTER — Encounter (HOSPITAL_BASED_OUTPATIENT_CLINIC_OR_DEPARTMENT_OTHER): Payer: Self-pay

## 2022-09-13 ENCOUNTER — Ambulatory Visit (HOSPITAL_BASED_OUTPATIENT_CLINIC_OR_DEPARTMENT_OTHER)
Admission: RE | Admit: 2022-09-13 | Discharge: 2022-09-13 | Disposition: A | Discharge status: Home / Self Care | Payer: Medicare Other | Source: Ambulatory Visit | Attending: Family Medicine | Admitting: Family Medicine

## 2022-09-13 DIAGNOSIS — R109 Unspecified abdominal pain: Secondary | ICD-10-CM | POA: Insufficient documentation

## 2022-09-13 MED ORDER — IOHEXOL 300 MG/ML  SOLN
100.0000 mL | Freq: Once | INTRAMUSCULAR | Status: AC | PRN
  Administered 2022-09-13: 75 mL via INTRAVENOUS

## 2022-09-13 NOTE — Addendum Note (Signed)
Addended by: [name], Gualberto Wahlen G on: 09/13/2022 08:24 PM   Modules accepted: Orders

## 2022-09-14 DIAGNOSIS — M25561 Pain in right knee: Secondary | ICD-10-CM | POA: Diagnosis not present

## 2022-10-10 DIAGNOSIS — Z5333 Arthroscopic surgical procedure converted to open procedure: Secondary | ICD-10-CM | POA: Diagnosis not present

## 2022-10-10 DIAGNOSIS — M25561 Pain in right knee: Secondary | ICD-10-CM | POA: Diagnosis not present

## 2022-10-10 DIAGNOSIS — M25361 Other instability, right knee: Secondary | ICD-10-CM | POA: Diagnosis not present

## 2022-10-15 DIAGNOSIS — M25561 Pain in right knee: Secondary | ICD-10-CM | POA: Diagnosis not present

## 2022-10-18 DIAGNOSIS — Z5333 Arthroscopic surgical procedure converted to open procedure: Secondary | ICD-10-CM | POA: Diagnosis not present

## 2022-10-18 DIAGNOSIS — M25361 Other instability, right knee: Secondary | ICD-10-CM | POA: Diagnosis not present

## 2022-10-18 DIAGNOSIS — M25561 Pain in right knee: Secondary | ICD-10-CM | POA: Diagnosis not present

## 2022-10-22 DIAGNOSIS — M25561 Pain in right knee: Secondary | ICD-10-CM | POA: Diagnosis not present

## 2022-10-22 DIAGNOSIS — Z5333 Arthroscopic surgical procedure converted to open procedure: Secondary | ICD-10-CM | POA: Diagnosis not present

## 2022-10-22 DIAGNOSIS — M25361 Other instability, right knee: Secondary | ICD-10-CM | POA: Diagnosis not present

## 2022-10-29 DIAGNOSIS — M25361 Other instability, right knee: Secondary | ICD-10-CM | POA: Diagnosis not present

## 2022-10-29 DIAGNOSIS — M25561 Pain in right knee: Secondary | ICD-10-CM | POA: Diagnosis not present

## 2022-10-29 DIAGNOSIS — Z5333 Arthroscopic surgical procedure converted to open procedure: Secondary | ICD-10-CM | POA: Diagnosis not present

## 2022-11-02 DIAGNOSIS — Z5333 Arthroscopic surgical procedure converted to open procedure: Secondary | ICD-10-CM | POA: Diagnosis not present

## 2022-11-02 DIAGNOSIS — M25361 Other instability, right knee: Secondary | ICD-10-CM | POA: Diagnosis not present

## 2022-11-02 DIAGNOSIS — M25561 Pain in right knee: Secondary | ICD-10-CM | POA: Diagnosis not present

## 2022-11-05 DIAGNOSIS — M25561 Pain in right knee: Secondary | ICD-10-CM | POA: Diagnosis not present

## 2022-11-05 DIAGNOSIS — Z5333 Arthroscopic surgical procedure converted to open procedure: Secondary | ICD-10-CM | POA: Diagnosis not present

## 2022-11-05 DIAGNOSIS — M25361 Other instability, right knee: Secondary | ICD-10-CM | POA: Diagnosis not present

## 2022-11-15 DIAGNOSIS — M25561 Pain in right knee: Secondary | ICD-10-CM | POA: Diagnosis not present

## 2022-12-04 DIAGNOSIS — Z4789 Encounter for other orthopedic aftercare: Secondary | ICD-10-CM | POA: Diagnosis not present

## 2022-12-05 DIAGNOSIS — Z79899 Other long term (current) drug therapy: Secondary | ICD-10-CM | POA: Diagnosis not present

## 2022-12-06 ENCOUNTER — Other Ambulatory Visit: Payer: Self-pay | Admitting: Family Medicine

## 2022-12-10 ENCOUNTER — Other Ambulatory Visit: Payer: Self-pay | Admitting: *Deleted

## 2022-12-10 DIAGNOSIS — Z87891 Personal history of nicotine dependence: Secondary | ICD-10-CM

## 2022-12-10 DIAGNOSIS — Z122 Encounter for screening for malignant neoplasm of respiratory organs: Secondary | ICD-10-CM

## 2022-12-14 DIAGNOSIS — M25561 Pain in right knee: Secondary | ICD-10-CM | POA: Diagnosis not present

## 2022-12-18 NOTE — Progress Notes (Signed)
ACUTE VISIT Chief Complaint  Patient presents with   Hyperglycemia   inflammation in knee   HPI: [name] is a 57 y.o. female with PMHx significant for chronic pain, fibromyalgia, hyperlipidemia, vitamin D deficiency, anxiety, and depression  here today concerned about glucose levels, she would like to have her HgA1C re-evaluated.She also reports recent weight gain and difficulty adhering to her diet and not exercising regularly.  Lab Results  Component Value Date   HGBA1C 6.1 07/13/2022  She has been inactive for the past six months due to a knee injury, which led to surgery. Despite completing physical therapy, she reports ongoing soreness and "inflammation" in the knee. She has gained most of ehr ROM, closed to her baseline.  Additionally, she experiences significant pain and discomfort from multiple "lipomas", impacting her daily activities and quality of life. She would like referral to general surgeon,Elizabeth White.  Chronic pain and fibromyalgia: She expresses a high daily pain level, worsened compared to six months ago, and has difficulty sleeping despite of being on Percocet 7.5-325 mg qid.  She is also on Duloxetine 60 mg daily, Gabapentin 1200 mg tid, Meloxicam 15 mg daily, and Methocarbamol 500 mg qid prn.  HLD: She is not on pharmacologic treatment.  Lab Results  Component Value Date   CHOL 181 12/06/2020   HDL 42.20 12/06/2020   LDLCALC 103 (H) 12/06/2020   TRIG 183.0 (H) 12/06/2020   CHOLHDL 4 12/06/2020   Hyperkalemia in 08/2022. Negative for CP,palpitations,or cramps.  Lab Results  Component Value Date   CREATININE 0.75 09/11/2022   BUN 19 09/11/2022   NA 142 09/11/2022   K 5.8 No hemolysis seen (H) 09/11/2022   CL 103 09/11/2022   CO2 32 09/11/2022   Vit D def, she is not on vit D supplementation. 25 OH vit D in 07/2022 was 29.7.  Review of Systems  Constitutional:  Positive for fatigue. Negative for chills and fever.   Respiratory:  Negative for cough, shortness of breath and wheezing.   Cardiovascular:  Negative for chest pain.  Gastrointestinal:  Negative for abdominal pain, nausea and vomiting.  Endocrine: Negative for cold intolerance, heat intolerance, polydipsia, polyphagia and polyuria.  Genitourinary:  Negative for decreased urine volume, dysuria and hematuria.  Musculoskeletal:  Positive for myalgias. Negative for gait problem.  Neurological:  Negative for syncope and headaches.  Psychiatric/Behavioral:  Positive for sleep disturbance. The patient is nervous/anxious.   See other pertinent positives and negatives in HPI.  Current Outpatient Medications on File Prior to Visit  Medication Sig Dispense Refill   acetaminophen (TYLENOL) 500 MG tablet Take 1,000 mg by mouth every 6 (six) hours as needed for mild pain or moderate pain.     antiseptic oral rinse (BIOTENE) LIQD 15 mLs by Mouth Rinse route as needed for dry mouth.     aspirin EC 81 MG tablet Take 81 mg by mouth in the morning and at bedtime.     Benzocaine (KANK-A MT) Use as directed 1 Application in the mouth or throat as needed (oral sores).     buprenorphine (BUTRANS) 15 MCG/HR Place 1 patch onto the skin once a week.     docusate sodium (COLACE) 100 MG capsule Take 100 mg by mouth 2 (two) times daily.     DULoxetine (CYMBALTA) 60 MG capsule Take 60 mg by mouth every evening.     fluticasone (FLONASE) 50 MCG/ACT nasal spray SPRAY 2 SPRAYS INTO EACH NOSTRIL EVERY DAY 16 mL 1  gabapentin (NEURONTIN) 600 MG tablet Take 1,200 mg by mouth 3 (three) times daily.     HYDROXYZINE HCL PO Take by mouth.     meloxicam (MOBIC) 15 MG tablet Take 15 mg by mouth in the morning.     methocarbamol (ROBAXIN) 500 MG tablet Take 500 mg by mouth 4 (four) times daily.     Multiple Vitamin (MULTIVITAMIN WITH MINERALS) TABS tablet Take 1 tablet by mouth daily. One A Day Multivitamin     ondansetron (ZOFRAN) 4 MG tablet Take 1 tablet (4 mg total) by mouth  every 8 (eight) hours as needed. 20 tablet 0   oxyCODONE-acetaminophen (PERCOCET) 7.5-325 MG tablet Take 1 tablet by mouth every 6 (six) hours as needed for severe pain. 20 tablet 0   traZODone (DESYREL) 100 MG tablet Take 100 mg by mouth at bedtime.     No current facility-administered medications on file prior to visit.   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   Allergies  Allergen Reactions   Pneumovax 23 [Pneumococcal Vac Polyvalent] Other (See Comments)    Arm mass   Seroquel [Quetiapine Fumarate] Nausea Only    Nausea, headache and lethargy   Social History   Socioeconomic History   Marital status: Divorced    Spouse name: Not on file   Number of children: 3   Years of education: Not on file   Highest education level: Not on file  Occupational History   Not on file  Tobacco Use   Smoking status: Former    Packs/day: 1.00    Years: 30.00    Additional pack years: 0.00    Total pack years: 30.00    Types: Cigarettes    Start date: 72    Quit date: 12/01/2022    Years since quitting: 0.0    Passive exposure: Current   Smokeless tobacco: Never  Vaping Use   Vaping Use: Never used  Substance and Sexual Activity   Alcohol use: No   Drug use: No   Sexual activity: Not Currently  Other Topics Concern   Not on file  Social History Narrative   Not on file   Social Determinants of Health   Financial Resource Strain: Low Risk  (02/12/2022)   Overall Financial Resource Strain (CARDIA)    Difficulty of Paying Living Expenses: Not hard at all  Food Insecurity: No Food  Insecurity (08/17/2022)   Hunger Vital Sign    Worried About Running Out of Food in the Last Year: Never true    Ran Out of Food in the Last Year: Never true  Transportation Needs: No Transportation Needs (08/17/2022)   PRAPARE - Hydrologist (Medical): No    Lack of Transportation (Non-Medical): No  Physical Activity: Insufficiently Active (02/12/2022)   Exercise Vital Sign    Days of Exercise per Week: 4 days    Minutes of Exercise per Session: 30 min  Stress: Stress Concern Present (02/12/2022)  [place] Questionnaire    Feeling of Stress : To some extent  Social Connections: Moderately Isolated (02/07/2021)   Social Connection and Isolation Panel [NHANES]    Frequency of Communication with Friends and Family: More than three times a week    Frequency of Social Gatherings with Friends and Family: More than three times a week    Attends Religious Services: More than 4 times per year    Active Member of Clubs or Organizations: No    Attends Archivist Meetings: Never    Marital Status: Divorced   Vitals:   12/19/22 1152  BP: 128/80  Pulse: 94  Resp: 16  Temp: 98.4 F (36.9 C)  SpO2: 99%   Wt Readings from Last 3 Encounters:  12/19/22 225 lb 4 oz (102.2 kg)  09/11/22 216 lb 8 oz (98.2 kg)  08/21/22 215 lb (97.5 kg)   Body mass index is 39.28 kg/m.  Physical Exam Vitals and nursing note reviewed.  Constitutional:      General: She is not in acute distress.    Appearance: She is well-developed.  HENT:     Head: Normocephalic and atraumatic.  Eyes:     Conjunctiva/sclera: Conjunctivae normal.  Cardiovascular:     Rate and Rhythm: Normal rate and regular rhythm.     Heart sounds: No murmur heard. Pulmonary:     Effort: Pulmonary effort is normal. No respiratory distress.     Breath sounds: Normal breath sounds.  Abdominal:     Palpations: Abdomen is soft. There is no  hepatomegaly or mass.     Tenderness: There is no abdominal tenderness.  Musculoskeletal:     Comments: Tender trigger points in back,chest wall,and extremities.  Lymphadenopathy:     Cervical: No cervical adenopathy.  Skin:    General: Skin is warm.     Findings: No erythema or rash.     Comments: Palpable nodular lesions in right arm and LUQ abdominal wall.Mobile, tender, no skin changes.  Neurological:     General: No focal deficit present.     Mental Status: She is alert and oriented to person, place, and time.     Cranial Nerves: No cranial nerve deficit.     Gait: Gait normal.     Comments: Antalgic gait, not assisted.  Psychiatric:        Mood and Affect: Affect normal. Mood is anxious.   ASSESSMENT AND PLAN: Ms. Burrous was seen today for follow up.  Prediabetes Assessment & Plan: Encouraged consistency with a healthy life style for diabetes prevention. Last HgA1C was 6.1. She is not interested in Metformin.  Orders: -     Hemoglobin A1c; Future  Dercum disease  Assessment & Plan: Multiple lipomas, aggravating chronic pain (fibromyalgia). She would like some lipomas removed. Referred to general surgeon as requested.  Orders: -     Ambulatory referral to General Surgery  Hyperlipidemia, unspecified hyperlipidemia type Assessment & Plan: Non pharmacologic treatment recommended for now. Further recommendations will be given according to 10 years CVD risk score and lipid panel numbers.  Orders: -     Lipid panel; Future  Hyperkalemia 08/2022 K+ 5.8, no hemolysis   -     Basic metabolic panel; Future  Morbid obesity ([place]) Assessment & Plan: Comorbilities: Depression, OA, and HLD. Gained about 9 Lb since 08/2022. She understands the benefits of wt loss as well as adverse effects of obesity. Consistency with healthy diet and physical activity encouraged.  Vitamin D deficiency Assessment & Plan: She has not been on vit D supplementation. 26 OH vit D  added to next blood work.  Orders: -     VITAMIN D 25 Hydroxy (Vit-D Deficiency, Fractures); Future   Return in about 6 months (around 06/21/2023) for CPE.  Elon Lomeli G. [name], MD  [facility]. [place] office.

## 2022-12-19 ENCOUNTER — Encounter: Payer: Self-pay | Admitting: Family Medicine

## 2022-12-19 ENCOUNTER — Ambulatory Visit: Payer: 59 | Admitting: Family Medicine

## 2022-12-19 ENCOUNTER — Ambulatory Visit (INDEPENDENT_AMBULATORY_CARE_PROVIDER_SITE_OTHER): Payer: 59 | Admitting: Family Medicine

## 2022-12-19 VITALS — BP 128/80 | HR 94 | Temp 98.4°F | Resp 16 | Ht 63.5 in | Wt 225.2 lb

## 2022-12-19 DIAGNOSIS — E785 Hyperlipidemia, unspecified: Secondary | ICD-10-CM | POA: Diagnosis not present

## 2022-12-19 DIAGNOSIS — E882 Lipomatosis, not elsewhere classified: Secondary | ICD-10-CM | POA: Diagnosis not present

## 2022-12-19 DIAGNOSIS — G894 Chronic pain syndrome: Secondary | ICD-10-CM | POA: Diagnosis not present

## 2022-12-19 DIAGNOSIS — M5412 Radiculopathy, cervical region: Secondary | ICD-10-CM | POA: Diagnosis not present

## 2022-12-19 DIAGNOSIS — M797 Fibromyalgia: Secondary | ICD-10-CM | POA: Diagnosis not present

## 2022-12-19 DIAGNOSIS — E559 Vitamin D deficiency, unspecified: Secondary | ICD-10-CM

## 2022-12-19 DIAGNOSIS — E875 Hyperkalemia: Secondary | ICD-10-CM

## 2022-12-19 DIAGNOSIS — D179 Benign lipomatous neoplasm, unspecified: Secondary | ICD-10-CM

## 2022-12-19 DIAGNOSIS — R7303 Prediabetes: Secondary | ICD-10-CM | POA: Insufficient documentation

## 2022-12-19 NOTE — Patient Instructions (Addendum)
A few things to remember from today's visit:  Prediabetes - Plan: Hemoglobin A1c  Multiple lipomas - Plan: Ambulatory referral to General Surgery  Hyperlipidemia, unspecified hyperlipidemia type - Plan: Lipid panel  Hyperkalemia - Plan: Basic metabolic panel  If you need refills for medications you take chronically, please call your pharmacy. Do not use My Chart to request refills or for acute issues that need immediate attention. If you send a my chart message, it may take a few days to be addressed, specially if I am not in the office.  Please be sure medication list is accurate. If a new problem present, please set up appointment sooner than planned today.

## 2022-12-22 DIAGNOSIS — D179 Benign lipomatous neoplasm, unspecified: Secondary | ICD-10-CM | POA: Insufficient documentation

## 2022-12-22 NOTE — Assessment & Plan Note (Deleted)
She thinks it is aggravating her chronic pain. Referred to general surgeon as requested.

## 2022-12-22 NOTE — Assessment & Plan Note (Signed)
Comorbilities: Depression, OA, and HLD. Gained about 9 Lb since 08/2022. She understands the benefits of wt loss as well as adverse effects of obesity. Consistency with healthy diet and physical activity encouraged.

## 2022-12-22 NOTE — Assessment & Plan Note (Signed)
Multiple lipomas, aggravating chronic pain (fibromyalgia). She would like some lipomas removed. Referred to general surgeon as requested.

## 2022-12-22 NOTE — Assessment & Plan Note (Signed)
She has not been on vit D supplementation. 34 OH vit D added to next blood work.

## 2022-12-22 NOTE — Assessment & Plan Note (Signed)
Encouraged consistency with a healthy life style for diabetes prevention. Last HgA1C was 6.1. She is not interested in Metformin.

## 2022-12-22 NOTE — Assessment & Plan Note (Signed)
Non pharmacologic treatment recommended for now. Further recommendations will be given according to 10 years CVD risk score and lipid panel numbers. 

## 2022-12-27 ENCOUNTER — Other Ambulatory Visit (INDEPENDENT_AMBULATORY_CARE_PROVIDER_SITE_OTHER): Payer: 59

## 2022-12-27 ENCOUNTER — Other Ambulatory Visit: Payer: 59

## 2022-12-27 DIAGNOSIS — E875 Hyperkalemia: Secondary | ICD-10-CM

## 2022-12-27 DIAGNOSIS — E559 Vitamin D deficiency, unspecified: Secondary | ICD-10-CM | POA: Diagnosis not present

## 2022-12-27 DIAGNOSIS — M5412 Radiculopathy, cervical region: Secondary | ICD-10-CM | POA: Diagnosis not present

## 2022-12-27 DIAGNOSIS — Z79899 Other long term (current) drug therapy: Secondary | ICD-10-CM | POA: Diagnosis not present

## 2022-12-27 DIAGNOSIS — M797 Fibromyalgia: Secondary | ICD-10-CM | POA: Diagnosis not present

## 2022-12-27 DIAGNOSIS — R7303 Prediabetes: Secondary | ICD-10-CM | POA: Diagnosis not present

## 2022-12-27 DIAGNOSIS — E785 Hyperlipidemia, unspecified: Secondary | ICD-10-CM

## 2022-12-27 DIAGNOSIS — E882 Lipomatosis, not elsewhere classified: Secondary | ICD-10-CM | POA: Diagnosis not present

## 2022-12-27 LAB — BASIC METABOLIC PANEL
BUN: 22 mg/dL (ref 6–23)
CO2: 27 mEq/L (ref 19–32)
Calcium: 9.4 mg/dL (ref 8.4–10.5)
Chloride: 105 mEq/L (ref 96–112)
Creatinine, Ser: 0.7 mg/dL (ref 0.40–1.20)
GFR: 94.82 mL/min (ref >60.00)
Glucose, Bld: 121 mg/dL — ABNORMAL HIGH (ref 70–99)
Potassium: 4.2 mEq/L (ref 3.5–5.1)
Sodium: 140 mEq/L (ref 135–145)

## 2022-12-27 LAB — LIPID PANEL
Cholesterol: 203 mg/dL — ABNORMAL HIGH (ref 0–200)
HDL: 49.3 mg/dL (ref >39.00)
LDL Cholesterol: 116 mg/dL — ABNORMAL HIGH (ref 0–99)
NonHDL: 153.2
Total CHOL/HDL Ratio: 4
Triglycerides: 187 mg/dL — ABNORMAL HIGH (ref 0.0–149.0)
VLDL: 37.4 mg/dL (ref 0.0–40.0)

## 2022-12-27 LAB — HEMOGLOBIN A1C: Hgb A1c MFr Bld: 6.2 % (ref 4.6–6.5)

## 2022-12-27 LAB — VITAMIN D 25 HYDROXY (VIT D DEFICIENCY, FRACTURES): VITD: 23.25 ng/mL — ABNORMAL LOW (ref 30.00–100.00)

## 2023-01-01 ENCOUNTER — Encounter: Payer: Self-pay | Admitting: Acute Care

## 2023-01-01 ENCOUNTER — Ambulatory Visit (INDEPENDENT_AMBULATORY_CARE_PROVIDER_SITE_OTHER): Payer: 59 | Admitting: Acute Care

## 2023-01-01 DIAGNOSIS — Z87891 Personal history of nicotine dependence: Secondary | ICD-10-CM | POA: Diagnosis not present

## 2023-01-01 NOTE — Progress Notes (Signed)
Virtual Visit via Telephone Note  I connected with [name] on 01/01/23 at  2:30 PM EDT by telephone and verified that I am speaking with the correct person using two identifiers.  Location: Patient:  At home Provider:  [address], [place], [place], [address]    I discussed the limitations, risks, security and privacy concerns of performing an evaluation and management service by telephone and the availability of in person appointments. I also discussed with the patient that there may be a patient responsible charge related to this service. The patient expressed understanding and agreed to proceed.    Shared Decision Making Visit Lung Cancer Screening Program [phone])   Eligibility: Age 57 y.o. Pack Years Smoking History Calculation 31 pack year smoking history (# packs/per year x # years smoked) Recent History of coughing up blood  no Unexplained weight loss? no ( >Than 15 pounds within the last 6 months ) Prior History Lung / other cancer no (Diagnosis within the last 5 years already requiring surveillance chest CT Scans). Smoking Status Former Smoker Former Smokers: Years since quit: < 1 year  Quit Date: 12/01/2022  Visit Components: Discussion included one or more decision making aids. yes Discussion included risk/benefits of screening. yes Discussion included potential follow up diagnostic testing for abnormal scans. yes Discussion included meaning and risk of over diagnosis. yes Discussion included meaning and risk of False Positives. yes Discussion included meaning of total radiation exposure. yes  Counseling Included: Importance of adherence to annual lung cancer LDCT screening. yes Impact of comorbidities on ability to participate in the program. yes Ability and willingness to under diagnostic treatment. yes  Smoking Cessation Counseling: Current Smokers:  Discussed importance of smoking cessation. yes Information about tobacco cessation  classes and interventions provided to patient. yes Patient provided with "ticket" for LDCT Scan. yes Symptomatic Patient. no  Counseling NA Diagnosis Code: Tobacco Use Z72.0 Asymptomatic Patient yes  Counseling (Intermediate counseling: > three minutes counseling) [id number] Former Smokers:  Discussed the importance of maintaining cigarette abstinence. yes Diagnosis Code: Personal History of Nicotine Dependence. [id number] Information about tobacco cessation classes and interventions provided to patient. Yes Patient provided with "ticket" for LDCT Scan. yes Written Order for Lung Cancer Screening with LDCT placed in Epic. Yes (CT Chest Lung Cancer Screening Low Dose W/O CM) [id number] Z12.2-Screening of respiratory organs Z87.891-Personal history of nicotine dependence  I spent 25 minutes of face to face time/virtual visit time  with  [name] discussing the risks and benefits of lung cancer screening. We took the time to pause the power point at intervals to allow for questions to be asked and answered to ensure understanding. We discussed that she had taken the single most powerful action possible to decrease her risk of developing lung cancer when she quit smoking. I counseled her to remain smoke free, and to contact me if she ever had the desire to smoke again so that I can provide resources and tools to help support the effort to remain smoke free. We discussed the time and location of the scan, and that either  Doroteo Glassman RN, Joella Prince, RN or I  or I will call / send a letter with the results within  24-72 hours of receiving them. She has the office contact information in the event she needs to speak with me,  she verbalized understanding of all of the above and had no further questions upon leaving the office.     I explained to the patient  that there has been a high incidence of coronary artery disease noted on these exams. I explained that this is a non-gated exam therefore degree or  severity cannot be determined. This patient is not on statin therapy. I have asked the patient to follow-up with their PCP regarding any incidental finding of coronary artery disease and management with diet or medication as they feel is clinically indicated. The patient verbalized understanding of the above and had no further questions.     Magdalen Spatz, NP 01/01/2023

## 2023-01-01 NOTE — Patient Instructions (Signed)
Thank you for participating in the [facility]. It was our pleasure to meet you today. We will call you with the results of your scan within the next few days. Your scan will be assigned a Lung RADS category score by the physicians reading the scans.  This Lung RADS score determines follow up scanning.  See below for description of categories, and follow up screening recommendations. We will be in touch to schedule your follow up screening annually or based on recommendations of our providers. We will fax a copy of your scan results to your Primary Care Physician, or the physician who referred you to the program, to ensure they have the results. Please call the office if you have any questions or concerns regarding your scanning experience or results.  Our office number is [phone]. Please speak with Denise Phelps, RN. , or  Denise Buckner RN, They are  our Lung Cancer Screening RN.'s If They are unavailable when you call, Please leave a message on the voice mail. We will return your call at our earliest convenience.This voice mail is monitored several times a day.  Remember, if your scan is normal, we will scan you annually as long as you continue to meet the criteria for the program. (Age 50-80, Current smoker or smoker who has quit within the last 15 years). If you are a smoker, remember, quitting is the single most powerful action that you can take to decrease your risk of lung cancer and other pulmonary, breathing related problems. We know quitting is hard, and we are here to help.  Please let us know if there is anything we can do to help you meet your goal of quitting. If you are a former smoker, congratulations. We are proud of you! Remain smoke free! Remember you can refer friends or family members through the number above.  We will screen them to make sure they meet criteria for the program. Thank you for helping us take better care of you by  participating in Lung Screening.  You can receive free nicotine replacement therapy ( patches, gum or mints) by calling 1-800-QUIT NOW. Please call so we can get you on the path to becoming  a non-smoker. I know it is hard, but you can do this!  Lung RADS Categories:  Lung RADS 1: no nodules or definitely non-concerning nodules.  Recommendation is for a repeat annual scan in 12 months.  Lung RADS 2:  nodules that are non-concerning in appearance and behavior with a very low likelihood of becoming an active cancer. Recommendation is for a repeat annual scan in 12 months.  Lung RADS 3: nodules that are probably non-concerning , includes nodules with a low likelihood of becoming an active cancer.  Recommendation is for a 6-month repeat screening scan. Often noted after an upper respiratory illness. We will be in touch to make sure you have no questions, and to schedule your 6-month scan.  Lung RADS 4 A: nodules with concerning findings, recommendation is most often for a follow up scan in 3 months or additional testing based on our provider's assessment of the scan. We will be in touch to make sure you have no questions and to schedule the recommended 3 month follow up scan.  Lung RADS 4 B:  indicates findings that are concerning. We will be in touch with you to schedule additional diagnostic testing based on our provider's  assessment of the scan.  Other options for assistance in smoking cessation (   As covered by your insurance benefits)  Hypnosis for smoking cessation  [employer] [phone]  Acupuncture for smoking cessation  [facility] [phone]   

## 2023-01-02 ENCOUNTER — Ambulatory Visit
Admission: RE | Admit: 2023-01-02 | Discharge: 2023-01-02 | Disposition: A | Discharge status: Home / Self Care | Payer: 59 | Source: Ambulatory Visit | Attending: Acute Care | Admitting: Acute Care

## 2023-01-02 DIAGNOSIS — Z9049 Acquired absence of other specified parts of digestive tract: Secondary | ICD-10-CM | POA: Diagnosis not present

## 2023-01-02 DIAGNOSIS — Z87891 Personal history of nicotine dependence: Secondary | ICD-10-CM | POA: Diagnosis not present

## 2023-01-02 DIAGNOSIS — Z122 Encounter for screening for malignant neoplasm of respiratory organs: Secondary | ICD-10-CM

## 2023-01-02 DIAGNOSIS — K760 Fatty (change of) liver, not elsewhere classified: Secondary | ICD-10-CM | POA: Diagnosis not present

## 2023-01-04 ENCOUNTER — Other Ambulatory Visit: Payer: Self-pay

## 2023-01-04 DIAGNOSIS — Z87891 Personal history of nicotine dependence: Secondary | ICD-10-CM

## 2023-01-04 DIAGNOSIS — Z122 Encounter for screening for malignant neoplasm of respiratory organs: Secondary | ICD-10-CM

## 2023-01-10 ENCOUNTER — Telehealth: Payer: Self-pay | Admitting: Family Medicine

## 2023-01-10 MED ORDER — FLUTICASONE PROPIONATE 50 MCG/ACT NA SUSP: NASAL | 1 refills | Status: DC

## 2023-01-10 NOTE — Telephone Encounter (Signed)
Rx sent in

## 2023-01-10 NOTE — Telephone Encounter (Signed)
Prescription Request  01/10/2023  LOV: 12/19/2022  What is the name of the medication or equipment? fluticasone (FLONASE) 50 MCG/ACT nasal spray  Have you contacted your pharmacy to request a refill? Yes believe pharm sent it to the wrong Dr.   [facility] pharmacy would you like this sent to?  [id number] [place],  - [address] [place] [address] Phone: [phone] Fax: [phone]    Patient notified that their request is being sent to the clinical staff for review and that they should receive a response within 2 business days.   Please advise at Mobile [phone] (mobile)   Note: Pt called the pharm earlier this week to send the req but of course we never received it and pt is out and really needs it as soon as you all can send in the refill.

## 2023-01-14 DIAGNOSIS — M25561 Pain in right knee: Secondary | ICD-10-CM | POA: Diagnosis not present

## 2023-01-16 ENCOUNTER — Telehealth: Payer: Self-pay | Admitting: Family Medicine

## 2023-01-16 NOTE — Telephone Encounter (Addendum)
Pt states she had a CT Chest Lung Cancer Screening on 01/02/23.  Pt wants to know why no one has called her to go over these results?  She states she is very disappointed that she has been waiting 2 weeks.  Pt would like a call back ASAP, to go over these results.

## 2023-01-16 NOTE — Telephone Encounter (Signed)
Hi!  I spoke with Ms. Shekita and she hasn't heard anything back about her lung cancer screening results. Is someone from your office able to contact her with the results?  Thank you! Maralyn Sago, CMA

## 2023-01-17 NOTE — Telephone Encounter (Signed)
Attempted to contact pt. Left message for pt to call back to discuss Lung screening CT results.

## 2023-01-21 IMAGING — CT CT ANGIO CHEST
2 of 6 series · 19 of 36 positions shown · IV contrast (omnipaque)
Comparison: Chest CT dated 08/19/2014.

CLINICAL DATA: 55-year-old female with concern for pulmonary
embolism.

EXAM:
CT ANGIOGRAPHY CHEST WITH CONTRAST
TECHNIQUE: Multidetector CT imaging of the chest was performed using the
standard protocol during bolus administration of intravenous
contrast. Multiplanar CT image reconstructions and MIPs were
obtained to evaluate the vascular anatomy.
CONTRAST:  100mL OMNIPAQUE IOHEXOL 350 MG/ML SOLN

[Series 7: pe thins · axial · 0.71mm/px · z∈[-295,-46]mm · 18 of 396 slices shown]
[im 20/396  lung]
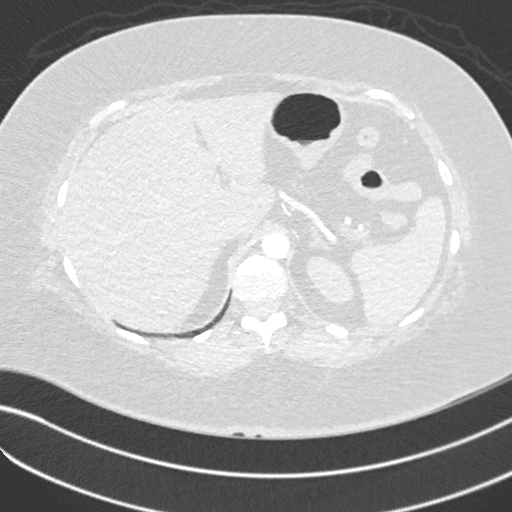
[im 40/396  mediastinal]
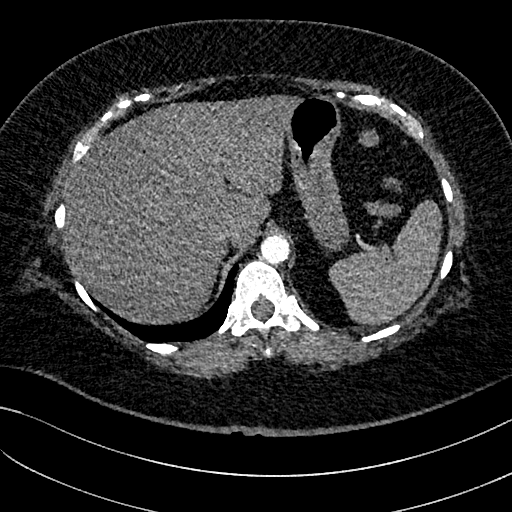
[im 60/396  lung]
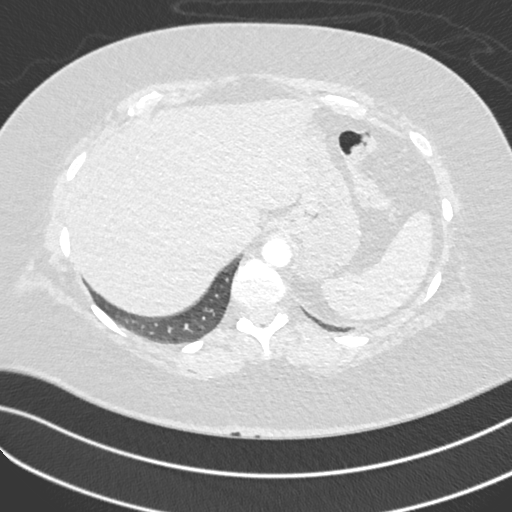
[im 80/396  mediastinal]
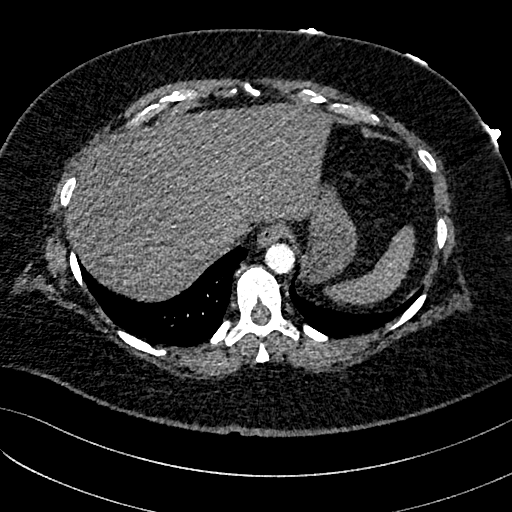
[im 99/396  lung]
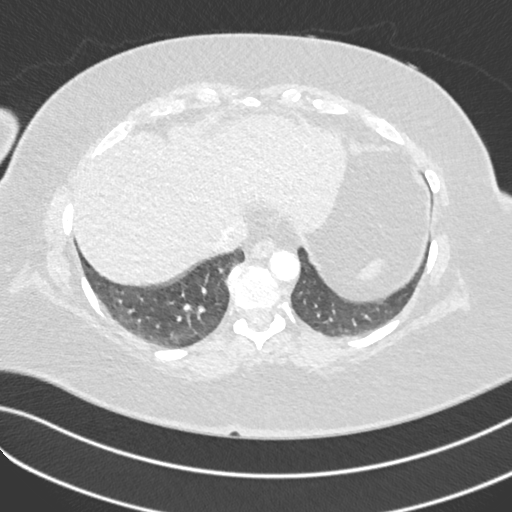
[im 119/396  mediastinal]
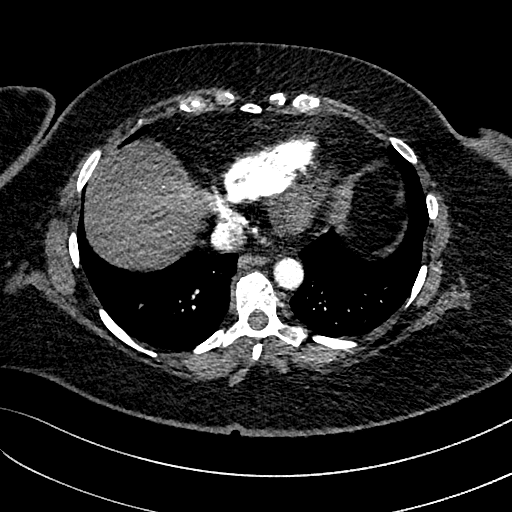
[im 139/396  lung]
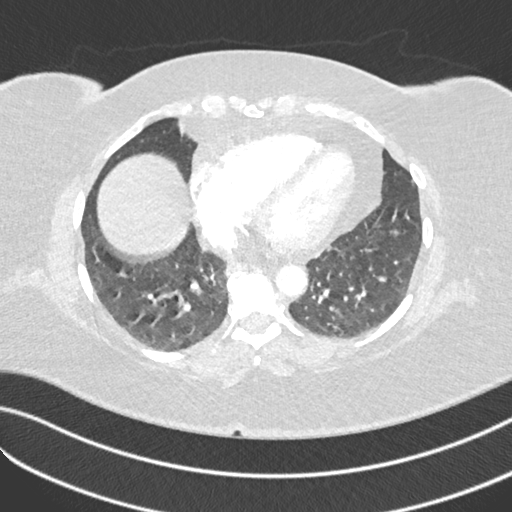
[im 159/396  mediastinal]
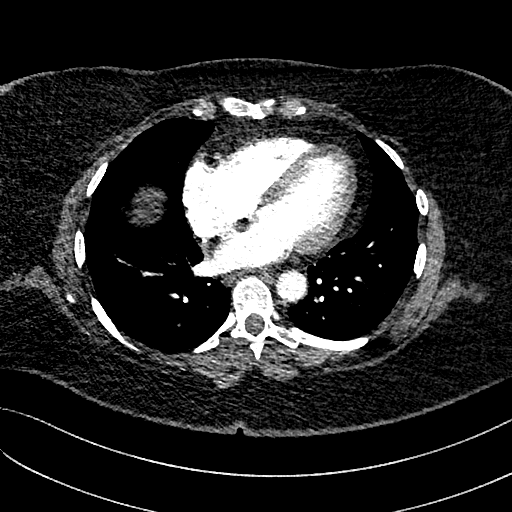
[im 178/396  lung]
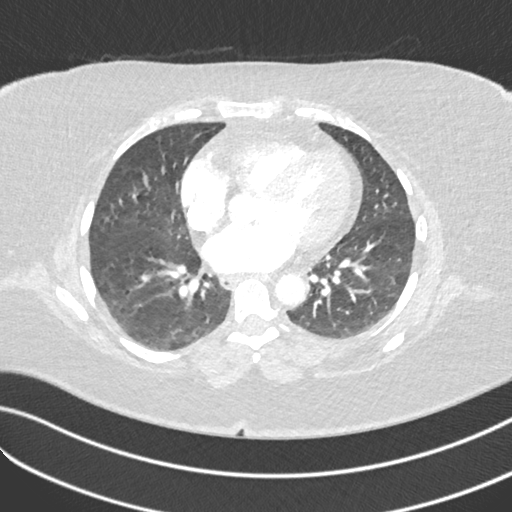
[im 218/396  mediastinal]
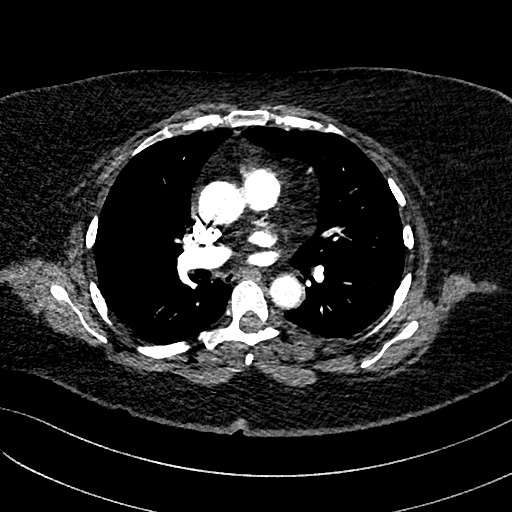
[im 238/396  lung]
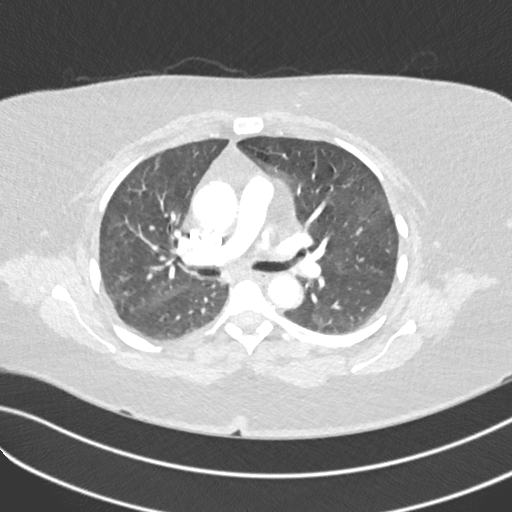
[im 257/396  mediastinal]
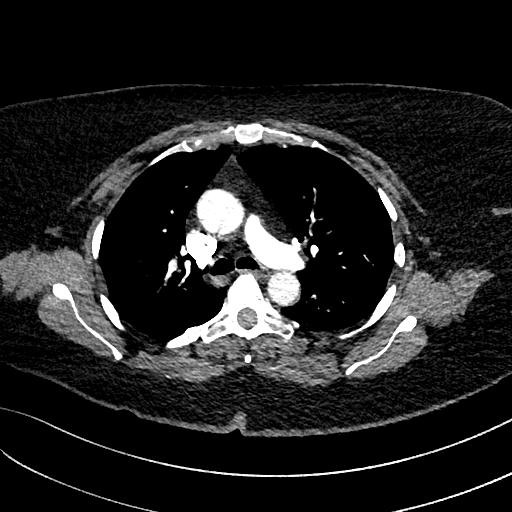
[im 277/396  lung]
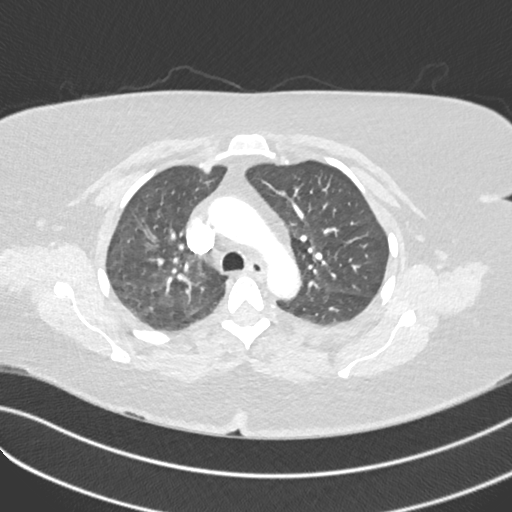
[im 297/396  mediastinal]
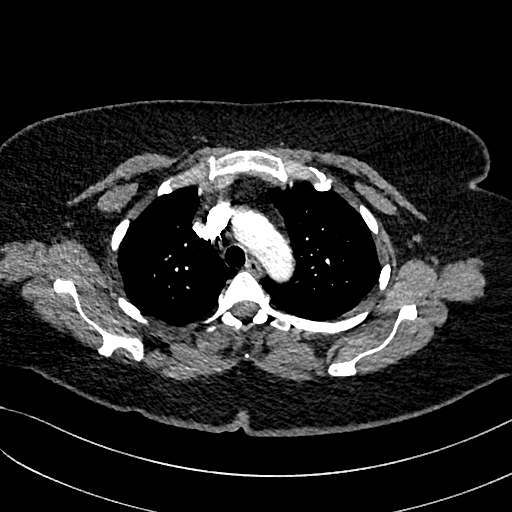
[im 317/396  lung]
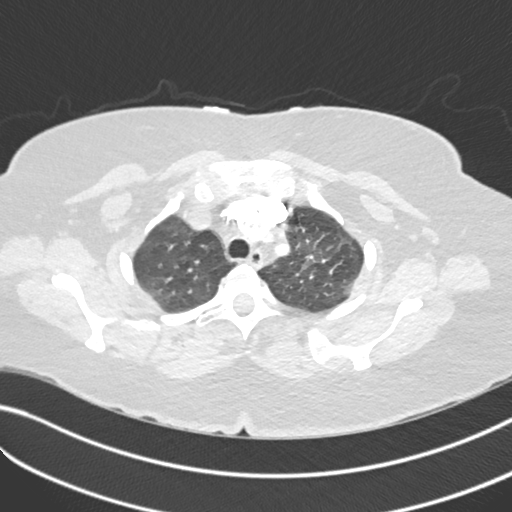
[im 336/396  mediastinal]
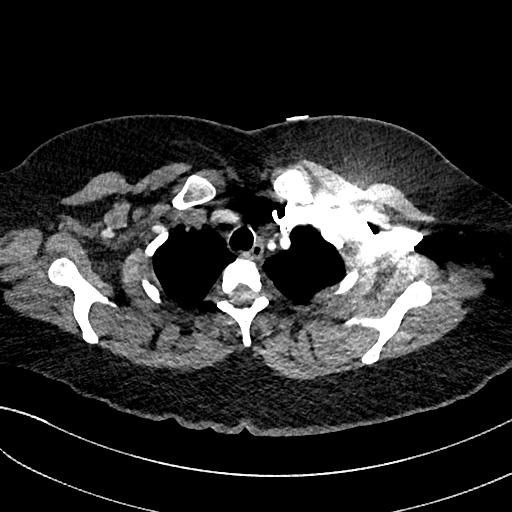
[im 356/396  lung]
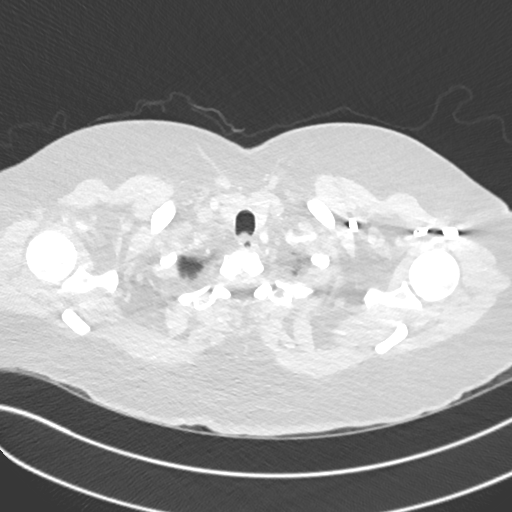
[im 376/396  mediastinal]
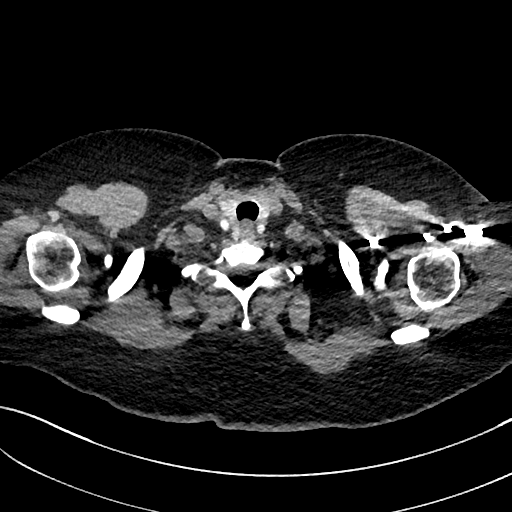

[Series 8: pe 2mm cor · coronal · 0.62mm/px · 1 of 151 slices shown]
[im 76/151  mediastinal]
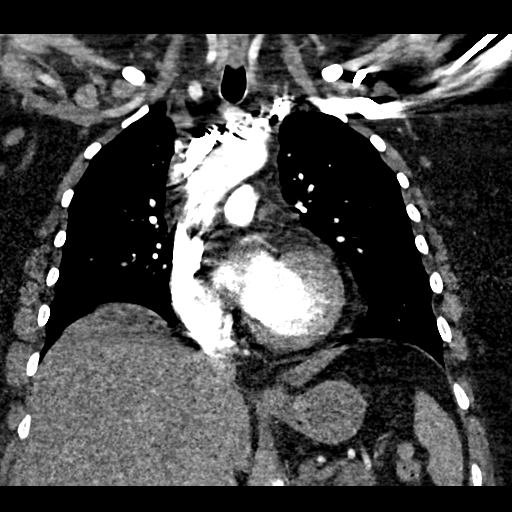

[19 of 36 positions shown; findings below may reference images not displayed]

FINDINGS: Cardiovascular: There is no cardiomegaly or pericardial effusion.
The thoracic aorta is unremarkable. The origins of the great vessels
of the aortic arch appear patent. No pulmonary artery embolus
identified.

Mediastinum/Nodes: No hilar or mediastinal adenopathy. The esophagus
and the thyroid gland are grossly unremarkable. No mediastinal fluid
collection.

Lungs/Pleura: No focal consolidation, pleural effusion, or
pneumothorax. Faint bilateral diffuse ground-glass densities, likely
atelectasis. The central airways are patent.

Upper Abdomen: No acute abnormality.

Musculoskeletal: No chest wall abnormality. No acute or significant
osseous findings.

Review of the MIP images confirms the above findings.
IMPRESSION: No acute intrathoracic pathology. No CT evidence of pulmonary
embolism.

## 2023-01-25 ENCOUNTER — Telehealth: Payer: Self-pay | Admitting: Family Medicine

## 2023-01-25 NOTE — Telephone Encounter (Signed)
Contacted [name] to schedule their annual wellness visit. Appointment made for 02/14/23.  [name] AWV direct phone # [phone]

## 2023-01-29 DIAGNOSIS — H10503 Unspecified blepharoconjunctivitis, bilateral: Secondary | ICD-10-CM | POA: Diagnosis not present

## 2023-02-13 DIAGNOSIS — M25561 Pain in right knee: Secondary | ICD-10-CM | POA: Diagnosis not present

## 2023-02-14 ENCOUNTER — Ambulatory Visit (INDEPENDENT_AMBULATORY_CARE_PROVIDER_SITE_OTHER): Payer: 59 | Admitting: Family Medicine

## 2023-02-14 VITALS — Ht 64.0 in | Wt 225.0 lb

## 2023-02-14 DIAGNOSIS — Z0000 Encounter for general adult medical examination without abnormal findings: Secondary | ICD-10-CM

## 2023-02-14 NOTE — Progress Notes (Signed)
PATIENT CHECK-IN and HEALTH RISK ASSESSMENT QUESTIONNAIRE:  -completed by phone/video for upcoming Medicare Preventive Visit  Pre-Visit Check-in: 1)Vitals (height, wt, BP, etc) - record in vitals section for visit on day of visit 2)Review and Update Medications, Allergies PMH, Surgeries, Social history in Epic 3)Hospitalizations in the last year with date/reason? Yes, for R knee surgery on 11/10-15, 2023.   4)Review and Update Care Team (patient's specialists) in Epic 5) Complete PHQ9 in Epic  6) Complete Fall Screening in Epic 7)Review all Health Maintenance Due and order under PCP if not done.  8)Medicare Wellness Questionnaire: Answer theses question about your habits: Do you drink alcohol? No If yes, how many drinks do you have a day?n/a Have you ever smoked?Yes Quit date if applicable? 12/01/2022  How many packs a day do/did you smoke? A pk a day Do you use smokeless tobacco?no Do you use an illicit drugs?no She is using nicotine gum, this was her 2nd time quitting, used patches and gum Do you exercises? Unable due to pain. But used to be active, then hurt knee and has become less and less active. Deals with OA, Fibromyagia and the lipomas. IF so, what type and how many days/minutes per week?n/a Are you sexually active? No Number of partners? Typical breakfast: cereal- cherios Typical lunch: sandwich-PB and J, egg sandwich Typical dinner: spaghiti with ground beef last night, has no restriction in diet Typical snacks: cut fruit or cheese cracker  Beverages: water, 0 zero sugar soda, unsweeten tea, sometimes OJ  Answer theses question about you: Can you perform most household chores?No Do you find it hard to follow a conversation in a noisy room?No Do you often ask people to speak up or repeat themselves?No Do you feel that you have a problem with memory?No Do you balance your checkbook and or bank acounts?yeah Do you feel safe at home? yea Last dentist visit?Last July 2023,  working  on scheduling with Edward Jolly and Edward Jolly Do you need assistance with any of the following: Please note if so     Driving?  Feeding yourself?  Getting from bed to chair?  Getting to the toilet?  Bathing or showering?  Dressing yourself?  Managing money?  Climbing a flight of stairs  Preparing meals? Sometimes due to unable to stand up for a long time.   Do you have Advanced Directives in place (Living Will, Healthcare Power or Attorney)? No however daughter and pt is working on it.    Last eye Exam and location? few wks ago with Dr. Milinda Cave Redionchenko. For pink eye. Has upcoming appt in October.    Do you currently use prescribed or non-prescribed narcotic or opioid pain medications?No  Do you have a history or close family history of breast, ovarian, tubal or peritoneal cancer or a family member with BRCA (breast cancer susceptibility 1 and 2) gene mutations? No  Nurse/Assistant Credentials/time stamp: Karpuih Moyun/CMA/1:58pm   ----------------------------------------------------------------------------------------------------------------------------------------------------------------------------------------------------------------------   MEDICARE ANNUAL PREVENTIVE VISIT WITH PROVIDER: (Welcome to [employer], initial annual wellness or annual wellness exam)  Virtual Visit via Video Note  I connected with [name] on 02/14/23 by a video enabled telemedicine application and verified that I am speaking with the correct person using two identifiers.  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    See HM section in Epic  for other details of completed HM.    ROS: negative for report of fevers, unintentional weight loss, vision  changes, vision loss, hearing loss or change, chest pain, sob, hemoptysis, melena, hematochezia, hematuria, falls, bleeding or bruising, thoughts of suicide or self harm, memory loss  Patient-completed extensive health risk assessment - reviewed and discussed with the patient: See Health Risk Assessment completed with patient prior to the visit either above or in recent phone note. This was reviewed in detailed with the patient today and appropriate recommendations, orders and referrals were placed as needed per Summary below and patient instructions.   Review of Medical History: -PMH, PSH, Family History and current specialty and care providers reviewed and updated and listed below   Patient Care Team: [place], Betty G, MD as PCP - General (Family Medicine)   Past Medical History:  Diagnosis Date   Allergy    seasonal   Anxiety    panic attack   Arthritis    hips, knees, shoulders,neck   Cataract    bilateral   Chronic pain    back, neck and body   Degenerative spinal arthritis    Depression    "had in the past,no medications now"   Dercum's disease    Painful lypomas throughout her body and loose connective tissue   Fibromyalgia    Pre-diabetes    Pt is loosing weight to lower A1c.    Past Surgical History:  Procedure Laterality Date   APPENDECTOMY  2002   BACK SURGERY  2007   L5 Lt discectiomy   CHOLECYSTECTOMY  2005   DENTAL SURGERY     multiple extraction   KNEE ARTHROSCOPY WITH MENISCAL REPAIR Right 08/10/2022   Procedure: KNEE ARTHROSCOPY WITH ROOT MEDIAL MENISCAL REPAIR;  Surgeon: Yolonda Kida, MD;  Location: [facility];  Service: Orthopedics;  Laterality: Right;  90   NOSE SURGERY  1974   fracture   SPINAL FUSION  2011   rod and screws   TONSILLECTOMY  1977   as a child   TUMOR REMOVAL Left 2002   lipoma under breast    Social History   Socioeconomic History   Marital status: Divorced    Spouse name: Not on file   Number of children: 3   Years  of education: Not on file   Highest education level: Not on file  Occupational History   Not on file  Tobacco Use   Smoking status: Former    Packs/day: 1.00    Years: 31.00    Additional pack years: 0.00    Total pack years: 31.00    Types: Cigarettes    Start date: 61    Quit date: 12/01/2022    Years since quitting: 0.2    Passive exposure: Current   Smokeless tobacco: Never  Vaping Use   Vaping Use: Never used  Substance and Sexual Activity   Alcohol use: No   Drug use: No   Sexual activity: Not Currently  Other Topics Concern   Not on file  Social History Narrative   Not on file   Social Determinants of Health   Financial Resource Strain: Low Risk  (02/12/2022)   Overall Financial Resource Strain (CARDIA)    Difficulty of Paying Living Expenses: Not hard at all  Food Insecurity: No Food Insecurity (02/14/2023)   Hunger Vital Sign    Worried About Running Out of Food in the Last Year: Never true    Ran Out of Food in the Last Year: Never  true  Transportation Needs: No Transportation Needs (02/14/2023)   PRAPARE - Administrator, Civil Service (Medical): No    Lack of Transportation (Non-Medical): No  Physical Activity: Unknown (02/14/2023)   Exercise Vital Sign    Days of Exercise per Week: 0 days    Minutes of Exercise per Session: Patient unable to answer  Stress: Stress Concern Present (02/14/2023)   [employer] of Occupational Health - Occupational Stress Questionnaire    Feeling of Stress : Very much  Social Connections: Unknown (02/14/2023)   Social Connection and Isolation Panel [NHANES]    Frequency of Communication with Friends and Family: Twice a week    Frequency of Social Gatherings with Friends and Family: Patient unable to answer    Attends Religious Services: Patient unable to answer    Active Member of Clubs or Organizations: Yes    Attends Banker Meetings: Patient unable to answer    Marital Status: Divorced   Intimate Partner Violence: Not At Risk (02/14/2023)   Humiliation, Afraid, Rape, and Kick questionnaire    Fear of Current or Ex-Partner: No    Emotionally Abused: No    Physically Abused: No    Sexually Abused: No    Family History  Problem Relation Age of Onset   Aneurysm Mother    Colon polyps Father    Lung cancer Father    Heart disease Brother        CABG at 48, fatal MI at 24   Heart disease Brother    Colon cancer Neg Hx    Esophageal cancer Neg Hx    Inflammatory bowel disease Neg Hx    Liver disease Neg Hx    Pancreatic cancer Neg Hx    Rectal cancer Neg Hx    Stomach cancer Neg Hx    Celiac disease Neg Hx    Crohn's disease Neg Hx     Current Outpatient Medications on File Prior to Visit  Medication Sig Dispense Refill   acetaminophen (TYLENOL) 500 MG tablet Take 1,000 mg by mouth every 6 (six) hours as needed for mild pain or moderate pain.     antiseptic oral rinse (BIOTENE) LIQD 15 mLs by Mouth Rinse route as needed for dry mouth.     aspirin EC 81 MG tablet Take 81 mg by mouth once. In the a.m     Benzocaine (KANK-A MT) Use as directed 1 Application in the mouth or throat as needed (oral sores).     buprenorphine (BUTRANS) 15 MCG/HR Place 1 patch onto the skin once a week.     DULoxetine (CYMBALTA) 60 MG capsule Take 60 mg by mouth every evening.     fluticasone (FLONASE) 50 MCG/ACT nasal spray SPRAY 2 SPRAYS INTO EACH NOSTRIL EVERY DAY 16 mL 1   gabapentin (NEURONTIN) 600 MG tablet Take 1,200 mg by mouth 3 (three) times daily.     HYDROXYZINE HCL PO Take by mouth.     meloxicam (MOBIC) 15 MG tablet Take 15 mg by mouth in the morning.     methocarbamol (ROBAXIN) 500 MG tablet Take 500 mg by mouth in the morning and at bedtime.     Multiple Vitamin (MULTIVITAMIN WITH MINERALS) TABS tablet Take 1 tablet by mouth daily. One A Day Multivitamin     traZODone (DESYREL) 100 MG tablet Take 100 mg by mouth at bedtime.     No current facility-administered medications  on file prior to visit.    Allergies  Allergen  Reactions   Pneumovax 23 [Pneumococcal Vac Polyvalent] Other (See Comments)    Arm mass   Seroquel [Quetiapine Fumarate] Nausea Only    Nausea, headache and lethargy       Physical Exam There were no vitals filed for this visit. Estimated body mass index is 38.62 kg/m as calculated from the following:   Height as of this encounter: 5\' 4"  (1.626 m).   Weight as of this encounter: 225 lb (102.1 kg).  EKG (optional): deferred due to virtual visit  GENERAL: alert, oriented, no acute distress detected, full vision exam deferred due to pandemic and/or virtual encounter  HEENT: atraumatic, conjunttiva clear, no obvious abnormalities on inspection of external nose and ears  NECK: normal movements of the head and neck  LUNGS: on inspection no signs of respiratory distress, breathing rate appears normal, no obvious gross SOB, gasping or wheezing  CV: no obvious cyanosis  MS: moves all visible extremities without noticeable abnormality  PSYCH/NEURO: pleasant and cooperative, no obvious depression or anxiety, speech and thought processing grossly intact, Cognitive function grossly intact  Flowsheet Row Office Visit from 02/14/2023 in [facility]  PHQ-9 Total Score 9           02/14/2023    1:38 PM 12/19/2022   12:29 PM 12/19/2022   11:59 AM 09/11/2022    3:34 PM 08/17/2022    1:08 PM  Depression screen PHQ 2/9  Decreased Interest 3 1 0 0 0  Down, Depressed, Hopeless 3 1 0 0 0  PHQ - 2 Score 6 2 0 0 0  Altered sleeping 0 1  0   Tired, decreased energy 3 1  0   Change in appetite 0 1  0   Feeling bad or failure about yourself  0 0  0   Trouble concentrating 0 0     Moving slowly or fidgety/restless 0 0  0   Suicidal thoughts 0 0  0   PHQ-9 Score 9 5  0   Difficult doing work/chores Extremely dIfficult Very difficult     See a great therapist - there when she needs it. Reports this is all just  related to the pain for OA, fibro and the dercum's. Does not feels needs anything more than her therapist. She does see psychiatrist as well.      08/12/2022    7:30 PM 08/13/2022    8:40 AM 09/11/2022    3:34 PM 12/19/2022   11:58 AM 02/14/2023    1:37 PM  Fall Risk  Falls in the past year?   0 0 0  Was there an injury with Fall?   0 0 0  Fall Risk Category Calculator   0 0 0  Fall Risk Category (Retired)   Low    (RETIRED) Patient Fall Risk Level High fall risk High fall risk High fall risk    Patient at Risk for Falls Due to   History of fall(s) No Fall Risks Other (Comment)  Fall risk Follow up   Falls evaluation completed Falls evaluation completed Falls evaluation completed     SUMMARY AND PLAN:  Encounter for Medicare annual wellness exam    Discussed applicable health maintenance/preventive health measures and advised and referred or ordered per patient preferences: -discussed hep c screening and vaccines due and recs, she plans to get   Health Maintenance  Topic Date Due   Hepatitis C Screening  Never done   DTaP/Tdap/Td (2 - Tdap) 05/01/2016  COVID-19 Vaccine (5 - 2023-24 season) 03/02/2023 (Originally 12/07/2022)   Zoster Vaccines- Shingrix (2 of 2) 05/17/2023 (Originally 06/28/2021)   INFLUENZA VACCINE  05/02/2023   Lung Cancer Screening  01/02/2024   Medicare Annual Wellness (AWV)  02/14/2024   MAMMOGRAM  06/21/2024   COLONOSCOPY (Pts 45-82yrs Insurance coverage will need to be confirmed)  01/17/2025   PAP SMEAR-Modifier  06/21/2025   HIV Screening  Completed   HPV VACCINES  Aged [employer] and counseling on the following was provided based on the above review of health and a plan/checklist for the patient, along with additional information discussed, was provided for the patient in the patient instructions :    -Advised and counseled on a healthy lifestyle - including the importance of a healthy diet, regular physical activity, social connections and  stress management. -Reviewed patient's current diet. Advised and counseled on a whole foods based healthy diet. A summary of a healthy diet was provided in the Patient Instructions. Goal to add 2 servings of berries and 3 servings leafy greens or cruciferous veggies per day. -reviewed patient's current physical activity level and discussed exercise guidelines for adults. Discussed community resources and ideas for safe exercise at home to assist in meeting exercise guideline recommendations in a safe and healthy way. Goal to start home chair yoga or chair tai chi - 5 minutes to start, then increase slowly. PCP has advised to exercise per her report.  -Advise yearly dental visits at minimum and regular eye exams -congratulated on smoking cessation and discussed ways to reduce nicotine gum as is able.   Follow up: see patient instructions     Patient Instructions  I really enjoyed getting to talk with you today! I am available on Tuesdays and Thursdays for virtual visits if you have any questions or concerns, or if I can be of any further assistance.   CHECKLIST FROM ANNUAL WELLNESS VISIT:  -Follow up (please call to schedule if not scheduled after visit):   -yearly for annual wellness visit with primary care office  Here is a list of your preventive care/health maintenance measures and the plan for each if any are due:  PLAN For any measures below that may be due:  -can get hepatitis c screening with labs if you wish, check with your insuranc eon coverage -can get the tetanus booster at office or pharmacy -can get the shingles vaccine and covid boosters at pharmacy -please let us know whe you get vaccines elsewhere so that we can update your chart Health Maintenance  Topic Date Due   Hepatitis C Screening  Never done   DTaP/Tdap/Td (2 - Tdap) 05/01/2016   COVID-19 Vaccine (5 - 2023-24 season) 03/02/2023 (Originally 12/07/2022)   Zoster Vaccines- Shingrix (2 of 2) 05/17/2023 (Originally  06/28/2021)   INFLUENZA VACCINE  05/02/2023   Lung Cancer Screening  01/02/2024   Medicare Annual Wellness (AWV)  02/14/2024   MAMMOGRAM  06/21/2024   COLONOSCOPY (Pts 45-1yrs Insurance coverage will need to be confirmed)  01/17/2025   PAP SMEAR-Modifier  06/21/2025   HIV Screening  Completed   HPV VACCINES  Aged Out    -See a dentist at least yearly  -Get your eyes checked and then per your eye specialist's recommendations  -Other issues addressed today:  -consider increasing berries, leafy greens and cruciferous veggies (broccoli, cauliflower, cabbage, etc) -consider starting 5 minutes per day of gentle chair exercise, gradually increase over time - take it slow  -I have included below  further information regarding a healthy whole foods based diet, physical activity guidelines for adults, stress management and opportunities for social connections. I hope you find this information useful.   -----------------------------------------------------------------------------------------------------------------------------------------------------------------------------------------------------------------------------------------------------------  NUTRITION: -eat real food: lots of colorful vegetables (half the plate) and fruits -5-7 servings of vegetables and fruits per day (fresh or steamed is best), exp. 2 servings of vegetables with lunch and dinner and 2 servings of fruit per day. Berries and greens such as kale and collards are great choices.  -consume on a regular basis: whole grains (make sure first ingredient on label contains the word "whole"), fresh fruits, fish, nuts, seeds, healthy oils (such as olive oil, avocado oil, grape seed oil) -may eat small amounts of dairy and lean meat on occasion, but avoid processed meats such as ham, bacon, lunch meat, etc. -drink water -try to avoid fast food and pre-packaged foods, processed meat -most experts advise limiting sodium to < 2300mg  per  day, should limit further is any chronic conditions such as high blood pressure, heart disease, diabetes, etc. The American Heart Association advised that < 1500mg  is is ideal -try to avoid foods that contain any ingredients with names you do not recognize  -try to avoid sugar/sweets (except for the natural sugar that occurs in fresh fruit) -try to avoid sweet drinks -try to avoid white rice, white bread, pasta (unless whole grain), white or yellow potatoes  EXERCISE GUIDELINES FOR ADULTS: -if you wish to increase your physical activity, do so gradually and with the approval of your doctor -STOP and seek medical care immediately if you have any chest pain, chest discomfort or trouble breathing when starting or increasing exercise  -move and stretch your body, legs, feet and arms when sitting for long periods -Physical activity guidelines for optimal health in adults: -least 150 minutes per week of aerobic exercise (can talk, but not sing) once approved by your doctor, 20-30 minutes of sustained activity or two 10 minute episodes of sustained activity every day.  -resistance training at least 2 days per week if approved by your doctor -balance exercises 3+ days per week:   Stand somewhere where you have something sturdy to hold onto if you lose balance.    1) lift up on toes, start with 5x per day and work up to 20x   2) stand and lift on leg straight out to the side so that foot is a few inches of the floor, start with 5x each side and work up to 20x each side   3) stand on one foot, start with 5 seconds each side and work up to 20 seconds on each side  If you need ideas or help with getting more active:  -Silver sneakers https://tools.silversneakers.com  -Walk with a Doc: http://www.duncan-williams.com/  -try to include resistance (weight lifting/strength building) and balance exercises twice per week: or the following link for  ideas: http://castillo-powell.com/  BuyDucts.dk  STRESS MANAGEMENT: -can try meditating, or just sitting quietly with deep breathing while intentionally relaxing all parts of your body for 5 minutes daily -if you need further help with stress, anxiety or depression please follow up with your primary doctor or contact the wonderful folks at [facility]: [phone]  SOCIAL CONNECTIONS: -options in [place] if you wish to engage in more social and exercise related activities:  -Silver sneakers https://tools.silversneakers.com  -Walk with a Doc: http://www.duncan-williams.com/  -Check out the [facility] Active Adults 50+ section on the [place] [employer] (hiking clubs, book clubs, cards and games, chess,  exercise classes, aquatic classes and much more) - see the website for details: https://www.[place].gov/departments/parks-recreation/active-adults50  -YouTube has lots of exercise videos for different ages and abilities as well  -[facility] (a variety of indoor and outdoor inperson activities for adults). [phone]. [address]  -Virtual Online Classes (a variety of topics): see seniorplanet.org or call (213)427-5186  -consider volunteering at a school, hospice center, church, senior center or elsewhere           Terressa Koyanagi, DO

## 2023-02-14 NOTE — Patient Instructions (Addendum)
I really enjoyed getting to talk with you today! I am available on Tuesdays and Thursdays for virtual visits if you have any questions or concerns, or if I can be of any further assistance.   CHECKLIST FROM ANNUAL WELLNESS VISIT:  -Follow up (please call to schedule if not scheduled after visit):   -yearly for annual wellness visit with primary care office  Here is a list of your preventive care/health maintenance measures and the plan for each if any are due:  PLAN For any measures below that may be due:  -can get hepatitis c screening with labs if you wish, check with your insuranc eon coverage -can get the tetanus booster at office or pharmacy -can get the shingles vaccine and covid boosters at pharmacy -please let us know whe you get vaccines elsewhere so that we can update your chart Health Maintenance  Topic Date Due   Hepatitis C Screening  Never done   DTaP/Tdap/Td (2 - Tdap) 05/01/2016   COVID-19 Vaccine (5 - 2023-24 season) 03/02/2023 (Originally 12/07/2022)   Zoster Vaccines- Shingrix (2 of 2) 05/17/2023 (Originally 06/28/2021)   INFLUENZA VACCINE  05/02/2023   Lung Cancer Screening  01/02/2024   Medicare Annual Wellness (AWV)  02/14/2024   MAMMOGRAM  06/21/2024   COLONOSCOPY (Pts 45-32yrs Insurance coverage will need to be confirmed)  01/17/2025   PAP SMEAR-Modifier  06/21/2025   HIV Screening  Completed   HPV VACCINES  Aged Out    -See a dentist at least yearly  -Get your eyes checked and then per your eye specialist's recommendations  -Other issues addressed today:  -consider increasing berries, leafy greens and cruciferous veggies (broccoli, cauliflower, cabbage, etc) -consider starting 5 minutes per day of gentle chair exercise, gradually increase over time - take it slow  -I have included below further information regarding a healthy whole foods based diet, physical activity guidelines for adults, stress management and opportunities for social connections. I hope  you find this information useful.   -----------------------------------------------------------------------------------------------------------------------------------------------------------------------------------------------------------------------------------------------------------  NUTRITION: -eat real food: lots of colorful vegetables (half the plate) and fruits -5-7 servings of vegetables and fruits per day (fresh or steamed is best), exp. 2 servings of vegetables with lunch and dinner and 2 servings of fruit per day. Berries and greens such as kale and collards are great choices.  -consume on a regular basis: whole grains (make sure first ingredient on label contains the word "whole"), fresh fruits, fish, nuts, seeds, healthy oils (such as olive oil, avocado oil, grape seed oil) -may eat small amounts of dairy and lean meat on occasion, but avoid processed meats such as ham, bacon, lunch meat, etc. -drink water -try to avoid fast food and pre-packaged foods, processed meat -most experts advise limiting sodium to < 2300mg  per day, should limit further is any chronic conditions such as high blood pressure, heart disease, diabetes, etc. The American Heart Association advised that < 1500mg  is is ideal -try to avoid foods that contain any ingredients with names you do not recognize  -try to avoid sugar/sweets (except for the natural sugar that occurs in fresh fruit) -try to avoid sweet drinks -try to avoid white rice, white bread, pasta (unless whole grain), white or yellow potatoes  EXERCISE GUIDELINES FOR ADULTS: -if you wish to increase your physical activity, do so gradually and with the approval of your doctor -STOP and seek medical care immediately if you have any chest pain, chest discomfort or trouble breathing when starting or increasing exercise  -move and stretch  your body, legs, feet and arms when sitting for long periods -Physical activity guidelines for optimal health in  adults: -least 150 minutes per week of aerobic exercise (can talk, but not sing) once approved by your doctor, 20-30 minutes of sustained activity or two 10 minute episodes of sustained activity every day.  -resistance training at least 2 days per week if approved by your doctor -balance exercises 3+ days per week:   Stand somewhere where you have something sturdy to hold onto if you lose balance.    1) lift up on toes, start with 5x per day and work up to 20x   2) stand and lift on leg straight out to the side so that foot is a few inches of the floor, start with 5x each side and work up to 20x each side   3) stand on one foot, start with 5 seconds each side and work up to 20 seconds on each side  If you need ideas or help with getting more active:  -Silver sneakers https://tools.silversneakers.com  -Walk with a Doc: http://www.duncan-williams.com/  -try to include resistance (weight lifting/strength building) and balance exercises twice per week: or the following link for ideas: http://castillo-powell.com/  BuyDucts.dk  STRESS MANAGEMENT: -can try meditating, or just sitting quietly with deep breathing while intentionally relaxing all parts of your body for 5 minutes daily -if you need further help with stress, anxiety or depression please follow up with your primary doctor or contact the wonderful folks at [facility]: [phone]  SOCIAL CONNECTIONS: -options in [place] if you wish to engage in more social and exercise related activities:  -Silver sneakers https://tools.silversneakers.com  -Walk with a Doc: http://www.duncan-williams.com/  -Check out the [facility] Active Adults 50+ section on the [place] of [employer] (hiking clubs, book clubs, cards and games, chess, exercise classes, aquatic classes and much more) - see the website for  details: https://www.[place].gov/departments/parks-recreation/active-adults50  -YouTube has lots of exercise videos for different ages and abilities as well  -[facility] (a variety of indoor and outdoor inperson activities for adults). [phone]. [address]  -Virtual Online Classes (a variety of topics): see seniorplanet.org or call [phone]  -consider volunteering at a school, hospice center, church, senior center or elsewhere

## 2023-02-15 ENCOUNTER — Other Ambulatory Visit: Payer: Self-pay | Admitting: Podiatry

## 2023-02-26 DIAGNOSIS — E882 Lipomatosis, not elsewhere classified: Secondary | ICD-10-CM | POA: Diagnosis not present

## 2023-02-26 DIAGNOSIS — L821 Other seborrheic keratosis: Secondary | ICD-10-CM | POA: Diagnosis not present

## 2023-03-04 DIAGNOSIS — M797 Fibromyalgia: Secondary | ICD-10-CM | POA: Diagnosis not present

## 2023-03-04 DIAGNOSIS — E882 Lipomatosis, not elsewhere classified: Secondary | ICD-10-CM | POA: Diagnosis not present

## 2023-03-04 DIAGNOSIS — G894 Chronic pain syndrome: Secondary | ICD-10-CM | POA: Diagnosis not present

## 2023-03-04 DIAGNOSIS — Z79899 Other long term (current) drug therapy: Secondary | ICD-10-CM | POA: Diagnosis not present

## 2023-03-04 DIAGNOSIS — M5412 Radiculopathy, cervical region: Secondary | ICD-10-CM | POA: Diagnosis not present

## 2023-03-15 DIAGNOSIS — E882 Lipomatosis, not elsewhere classified: Secondary | ICD-10-CM | POA: Diagnosis not present

## 2023-03-16 DIAGNOSIS — M25561 Pain in right knee: Secondary | ICD-10-CM | POA: Diagnosis not present

## 2023-04-15 DIAGNOSIS — M25561 Pain in right knee: Secondary | ICD-10-CM | POA: Diagnosis not present

## 2023-04-17 DIAGNOSIS — Z1231 Encounter for screening mammogram for malignant neoplasm of breast: Secondary | ICD-10-CM | POA: Diagnosis not present

## 2023-04-24 DIAGNOSIS — Z79899 Other long term (current) drug therapy: Secondary | ICD-10-CM | POA: Diagnosis not present

## 2023-04-24 DIAGNOSIS — G894 Chronic pain syndrome: Secondary | ICD-10-CM | POA: Diagnosis not present

## 2023-04-24 DIAGNOSIS — M797 Fibromyalgia: Secondary | ICD-10-CM | POA: Diagnosis not present

## 2023-04-24 DIAGNOSIS — M5412 Radiculopathy, cervical region: Secondary | ICD-10-CM | POA: Diagnosis not present

## 2023-05-16 DIAGNOSIS — M25561 Pain in right knee: Secondary | ICD-10-CM | POA: Diagnosis not present

## 2023-05-31 ENCOUNTER — Other Ambulatory Visit: Payer: Self-pay

## 2023-05-31 MED ORDER — FLUTICASONE PROPIONATE 50 MCG/ACT NA SUSP: NASAL | 1 refills | Status: DC

## 2023-06-04 ENCOUNTER — Encounter: Payer: Self-pay | Admitting: Family Medicine

## 2023-06-04 ENCOUNTER — Ambulatory Visit (INDEPENDENT_AMBULATORY_CARE_PROVIDER_SITE_OTHER): Payer: 59 | Admitting: Family Medicine

## 2023-06-04 VITALS — BP 136/70 | HR 93 | Temp 98.4°F | Resp 16 | Ht 64.0 in | Wt 233.8 lb

## 2023-06-04 DIAGNOSIS — E559 Vitamin D deficiency, unspecified: Secondary | ICD-10-CM | POA: Diagnosis not present

## 2023-06-04 DIAGNOSIS — Z0000 Encounter for general adult medical examination without abnormal findings: Secondary | ICD-10-CM | POA: Diagnosis not present

## 2023-06-04 DIAGNOSIS — R7303 Prediabetes: Secondary | ICD-10-CM

## 2023-06-04 DIAGNOSIS — E785 Hyperlipidemia, unspecified: Secondary | ICD-10-CM | POA: Diagnosis not present

## 2023-06-04 DIAGNOSIS — Z1159 Encounter for screening for other viral diseases: Secondary | ICD-10-CM

## 2023-06-04 NOTE — Assessment & Plan Note (Signed)
We discussed the importance of regular physical activity and healthy diet for prevention of chronic illness and/or complications. Preventive guidelines reviewed. Vaccination: It seem like she is due for Tdap and second Shingrix, instructed to get it at her pharmacy. Ca++ and vit D supplementation to continue. Continue her female preventive care with her gynecologist. Next CPE in a year.

## 2023-06-04 NOTE — Assessment & Plan Note (Addendum)
Non pharmacologic treatment recommended for now. Further recommendations will be given according to 10 years CVD risk score and lipid panel numbers. She would like to continue following every 6 months.

## 2023-06-04 NOTE — Patient Instructions (Addendum)
A few things to remember from today's visit:  Hyperlipidemia, unspecified hyperlipidemia type  Vitamin D deficiency  Prediabetes  Routine general medical examination at a health care facility  If you need refills for medications you take chronically, please call your pharmacy. Do not use My Chart to request refills or for acute issues that need immediate attention. If you send a my chart message, it may take a few days to be addressed, specially if I am not in the office.  Please be sure medication list is accurate. If a new problem present, please set up appointment sooner than planned today.  Health Maintenance, Female Adopting a healthy lifestyle and getting preventive care are important in promoting health and wellness. Ask your health care provider about: The right schedule for you to have regular tests and exams. Things you can do on your own to prevent diseases and keep yourself healthy. What should I know about diet, weight, and exercise? Eat a healthy diet  Eat a diet that includes plenty of vegetables, fruits, low-fat dairy products, and lean protein. Do not eat a lot of foods that are high in solid fats, added sugars, or sodium. Maintain a healthy weight Body mass index (BMI) is used to identify weight problems. It estimates body fat based on height and weight. Your health care provider can help determine your BMI and help you achieve or maintain a healthy weight. Get regular exercise Get regular exercise. This is one of the most important things you can do for your health. Most adults should: Exercise for at least 150 minutes each week. The exercise should increase your heart rate and make you sweat (moderate-intensity exercise). Do strengthening exercises at least twice a week. This is in addition to the moderate-intensity exercise. Spend less time sitting. Even light physical activity can be beneficial. Watch cholesterol and blood lipids Have your blood tested for  lipids and cholesterol at 59 years of age, then have this test every 5 years. Have your cholesterol levels checked more often if: Your lipid or cholesterol levels are high. You are older than 59 years of age. You are at high risk for heart disease. What should I know about cancer screening? Depending on your health history and family history, you may need to have cancer screening at various ages. This may include screening for: Breast cancer. Cervical cancer. Colorectal cancer. Skin cancer. Lung cancer. What should I know about heart disease, diabetes, and high blood pressure? Blood pressure and heart disease High blood pressure causes heart disease and increases the risk of stroke. This is more likely to develop in people who have high blood pressure readings or are overweight. Have your blood pressure checked: Every 3-5 years if you are 57-68 years of age. Every year if you are 50 years old or older. Diabetes Have regular diabetes screenings. This checks your fasting blood sugar level. Have the screening done: Once every three years after age 17 if you are at a normal weight and have a low risk for diabetes. More often and at a younger age if you are overweight or have a high risk for diabetes. What should I know about preventing infection? Hepatitis B If you have a higher risk for hepatitis B, you should be screened for this virus. Talk with your health care provider to find out if you are at risk for hepatitis B infection. Hepatitis C Testing is recommended for: Everyone born from [birth year] through 1967. Anyone with known risk factors for hepatitis C. Sexually transmitted  infections (STIs) Get screened for STIs, including gonorrhea and chlamydia, if: You are sexually active and are younger than 59 years of age. You are older than 59 years of age and your health care provider tells you that you are at risk for this type of infection. Your sexual activity has changed since you were last  screened, and you are at increased risk for chlamydia or gonorrhea. Ask your health care provider if you are at risk. Ask your health care provider about whether you are at high risk for HIV. Your health care provider may recommend a prescription medicine to help prevent HIV infection. If you choose to take medicine to prevent HIV, you should first get tested for HIV. You should then be tested every 3 months for as long as you are taking the medicine. Pregnancy If you are about to stop having your period (premenopausal) and you may become pregnant, seek counseling before you get pregnant. Take 400 to 800 micrograms (mcg) of folic acid every day if you become pregnant. Ask for birth control (contraception) if you want to prevent pregnancy. Osteoporosis and menopause Osteoporosis is a disease in which the bones lose minerals and strength with aging. This can result in bone fractures. If you are 62 years old or older, or if you are at risk for osteoporosis and fractures, ask your health care provider if you should: Be screened for bone loss. Take a calcium or vitamin D supplement to lower your risk of fractures. Be given hormone replacement therapy (HRT) to treat symptoms of menopause. Follow these instructions at home: Alcohol use Do not drink alcohol if: Your health care provider tells you not to drink. You are pregnant, may be pregnant, or are planning to become pregnant. If you drink alcohol: Limit how much you have to: 0-1 drink a day. Know how much alcohol is in your drink. In the [place] one drink equals one 12 oz bottle of beer (355 mL), one 5 oz glass of wine (148 mL), or one 1 oz glass of hard liquor (44 mL). Lifestyle Do not use any products that contain nicotine or tobacco. These products include cigarettes, chewing tobacco, and vaping devices, such as e-cigarettes. If you need help quitting, ask your health care provider. Do not use street drugs. Do not share needles. Ask your health  care provider for help if you need support or information about quitting drugs. General instructions Schedule regular health, dental, and eye exams. Stay current with your vaccines. Tell your health care provider if: You often feel depressed. You have ever been abused or do not feel safe at home. Summary Adopting a healthy lifestyle and getting preventive care are important in promoting health and wellness. Follow your health care provider's instructions about healthy diet, exercising, and getting tested or screened for diseases. Follow your health care provider's instructions on monitoring your cholesterol and blood pressure. This information is not intended to replace advice given to you by your health care provider. Make sure you discuss any questions you have with your health care provider. Document Revised: 02/06/2021 Document Reviewed: 02/06/2021 Elsevier Patient Education  2024 [employer].

## 2023-06-04 NOTE — Progress Notes (Unsigned)
HPI: [name] is a 57 y.o. female with past medical history significant for chronic pain syndrome, prediabetes, vitamin D deficiency,anxiety,depression,and fibromyalgia here today for her routine physical.  Last CPE: 03/2022.  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 [facility] treatments.  Her diet consists mainly of home-cooked meals with increased vegetables, chicken, and red meat twice weekly. She eats ice cream daily and has switched from whole milk to 2% for cereal. [place] foods infrequent except tostones every 2 weeks. She engages in light physical activity like walking on "good days" but she has not been consistent,  limited by pain.  She sleeps 8-9 hours on average with help of hydroxyzine and Tramadol prescribed by psychiatrist and pain management respectively. She quit smoking in February-2023 but had a relapse this weekend due to family issues.   She does not consume alcohol regularly but had a coconut rum drink occasionally, had one recently due to emotional distress.  She has kept up with lung cancer screening,last one on 01/03/23. She follows with her gynecologist annually.  Immunization History  Administered Date(s) Administered   Influenza, Quadrivalent, Recombinant, Inj, Pf 09/20/2020   Influenza,inj,Quad PF,6+ Mos 06/09/2021, 07/13/2022   Influenza-Unspecified 07/12/2020   PFIZER(Purple Top)SARS-COV-2 Vaccination 12/17/2019, 01/11/2020, 07/12/2020, 10/12/2022   Pneumococcal Polysaccharide-23 06/09/2021   Td 05/01/2006   Zoster Recombinant(Shingrix) 05/03/2021   Health Maintenance  Topic Date Due   Hepatitis C Screening  Never done   DTaP/Tdap/Td (2 - Tdap) 05/01/2016   Zoster Vaccines- Shingrix (2 of 2) 06/28/2021   COVID-19 Vaccine (5 - 2023-24 season)  06/20/2023 (Originally 06/02/2023)   INFLUENZA VACCINE  07/09/2023 (Originally 05/02/2023)   Lung Cancer Screening  01/02/2024   Medicare Annual Wellness (AWV)  02/14/2024   MAMMOGRAM  06/21/2024   Colonoscopy  01/17/2025   PAP SMEAR-Modifier  06/21/2025   HIV Screening  Completed   HPV VACCINES  Aged Out   Vit D deficiency: She is currently taking vitamin D supplement but does not recall the dosage. HLD: She is not on pharmacologic treatment.  Lab Results  Component Value Date   CHOL 203 (H) 12/27/2022   HDL 49.30 12/27/2022   LDLCALC 116 (H) 12/27/2022   TRIG 187.0 (H) 12/27/2022   CHOLHDL 4 12/27/2022   Prediabetes:  Negative for polydipsia,polyuria, or polyphagia.  Lab Results  Component Value Date   HGBA1C 6.2 12/27/2022   Review of Systems  Constitutional:  Positive for fatigue. Negative for activity change, appetite change and fever.  HENT:  Negative for hearing loss, mouth sores, sore throat and trouble swallowing.   Eyes:  Negative for redness and visual disturbance.  Respiratory:  Negative for cough, shortness of breath and wheezing.   Cardiovascular:  Negative for chest pain and leg swelling.  Gastrointestinal:  Negative for abdominal pain, nausea and vomiting.       No changes in bowel habits.  Endocrine: Negative for cold intolerance, heat intolerance, polydipsia, polyphagia and polyuria.  Genitourinary:  Negative for decreased urine volume, dysuria and hematuria.  Musculoskeletal:  Positive for arthralgias, back pain and myalgias. Negative for gait problem.  Skin:  Negative for color change and rash.  Allergic/Immunologic: Positive for environmental allergies.  Neurological:  Negative for seizures, syncope, weakness and headaches.  Hematological:  Negative for adenopathy. Does not bruise/bleed easily.  Psychiatric/Behavioral:  Positive for sleep disturbance. Negative  for confusion and hallucinations. The patient is nervous/anxious.   All other systems reviewed and  are negative.  Current Outpatient Medications on File Prior to Visit  Medication Sig Dispense Refill   acetaminophen (TYLENOL) 500 MG tablet Take 1,000 mg by mouth every 6 (six) hours as needed for mild pain or moderate pain.     antiseptic oral rinse (BIOTENE) LIQD 15 mLs by Mouth Rinse route as needed for dry mouth.     Benzocaine (KANK-A MT) Use as directed 1 Application in the mouth or throat as needed (oral sores).     buprenorphine (BUTRANS) 15 MCG/HR Place 1 patch onto the skin once a week.     DULoxetine (CYMBALTA) 60 MG capsule Take 60 mg by mouth every evening.     fluticasone (FLONASE) 50 MCG/ACT nasal spray SPRAY 2 SPRAYS INTO EACH NOSTRIL EVERY DAY 48 mL 1   gabapentin (NEURONTIN) 600 MG tablet Take 1,200 mg by mouth 3 (three) times daily.     hydrOXYzine (ATARAX) 25 MG tablet Take 25 mg by mouth 2 (two) times daily as needed.     HYDROXYZINE HCL PO Take by mouth.     meloxicam (MOBIC) 15 MG tablet Take 15 mg by mouth in the morning.     methocarbamol (ROBAXIN) 500 MG tablet Take 500 mg by mouth in the morning and at bedtime.     Multiple Vitamin (MULTIVITAMIN WITH MINERALS) TABS tablet Take 1 tablet by mouth daily. One A Day Multivitamin     tobramycin-dexamethasone (TOBRADEX) ophthalmic solution Apply to eye.     traZODone (DESYREL) 100 MG tablet Take 100 mg by mouth at bedtime.     No current facility-administered medications on file prior to visit.    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    Past Surgical History:  Procedure Laterality Date   APPENDECTOMY  2002    BACK SURGERY  2007   L5 Lt discectiomy   CHOLECYSTECTOMY  2005   DENTAL SURGERY     multiple extraction   KNEE ARTHROSCOPY WITH MENISCAL REPAIR Right 08/10/2022   Procedure: KNEE ARTHROSCOPY WITH ROOT MEDIAL MENISCAL REPAIR;  Surgeon: Yolonda Kida, MD;  Location: [facility];  Service: Orthopedics;  Laterality: Right;  88   NOSE SURGERY  1974   fracture   SPINAL FUSION  2011   rod and screws   TONSILLECTOMY  1977   as a child   TUMOR REMOVAL Left 2002   lipoma under breast    Allergies  Allergen Reactions   Pneumovax 23 [Pneumococcal Vac Polyvalent] Other (See Comments)    Arm mass   Seroquel [Quetiapine Fumarate] Nausea Only    Nausea, headache and lethargy    Family History  Problem Relation Age of Onset   Aneurysm Mother    Colon polyps Father    Lung cancer Father    Heart disease Brother        CABG at 88, fatal MI at 43  Heart disease Brother    Colon cancer Neg Hx    Esophageal cancer Neg Hx    Inflammatory bowel disease Neg Hx    Liver disease Neg Hx    Pancreatic cancer Neg Hx    Rectal cancer Neg Hx    Stomach cancer Neg Hx    Celiac disease Neg Hx    Crohn's disease Neg Hx     Social History   Socioeconomic History   Marital status: Divorced    Spouse name: Not on file   Number of children: 3   Years of education: Not on file   Highest education level: Not on file  Occupational History   Not on file  Tobacco Use   Smoking status: Former    Current packs/day: 0.00    Average packs/day: 1 pack/day for 43.2 years (43.2 ttl pk-yrs)    Types: Cigarettes    Start date: 69    Quit date: 12/01/2022    Years since quitting: 0.5    Passive exposure: Current   Smokeless tobacco: Never  Vaping Use   Vaping status: Never Used  Substance and Sexual Activity   Alcohol use: No   Drug use: No   Sexual activity: Not Currently  Other Topics Concern   Not on file  Social History Narrative   Not on file   Social Determinants of Health    Financial Resource Strain: Low Risk  (02/12/2022)   Overall Financial Resource Strain (CARDIA)    Difficulty of Paying Living Expenses: Not hard at all  Food Insecurity: No Food Insecurity (02/14/2023)   Hunger Vital Sign    Worried About Running Out of Food in the Last Year: Never true    Ran Out of Food in the Last Year: Never true  Transportation Needs: No Transportation Needs (02/14/2023)   PRAPARE - Administrator, Civil Service (Medical): No    Lack of Transportation (Non-Medical): No  Physical Activity: Unknown (02/14/2023)   Exercise Vital Sign    Days of Exercise per Week: 0 days    Minutes of Exercise per Session: Patient unable to answer  Stress: Stress Concern Present (02/14/2023)   [employer] of Occupational Health - Occupational Stress Questionnaire    Feeling of Stress : Very much  Social Connections: Unknown (02/14/2023)   Social Connection and Isolation Panel [NHANES]    Frequency of Communication with Friends and Family: Twice a week    Frequency of Social Gatherings with Friends and Family: Patient unable to answer    Attends Religious Services: Patient unable to answer    Active Member of Clubs or Organizations: Yes    Attends Banker Meetings: Patient unable to answer    Marital Status: Divorced   Vitals:   06/04/23 1517  BP: 136/70  Pulse: 93  Resp: 16  Temp: 98.4 F (36.9 C)  SpO2: 97%   Body mass index is 40.13 kg/m.  Wt Readings from Last 3 Encounters:  06/04/23 233 lb 12.8 oz (106.1 kg)  02/14/23 225 lb (102.1 kg)  12/19/22 225 lb 4 oz (102.2 kg)   Physical Exam Vitals and nursing note reviewed.  Constitutional:      General: She is not in acute distress.    Appearance: She is well-developed.  HENT:     Head: Normocephalic and atraumatic.     Right Ear: Hearing, tympanic membrane, ear canal and external ear normal.     Left Ear: Hearing, tympanic membrane, ear canal and external  ear normal.     Mouth/Throat:      Mouth: Mucous membranes are moist.     Pharynx: Oropharynx is clear. Uvula midline.  Eyes:     Extraocular Movements: Extraocular movements intact.     Conjunctiva/sclera: Conjunctivae normal.     Pupils: Pupils are equal, round, and reactive to light.  Neck:     Thyroid: No thyroid mass.  Cardiovascular:     Rate and Rhythm: Normal rate and regular rhythm.     Pulses:          Dorsalis pedis pulses are 2+ on the right side and 2+ on the left side.     Heart sounds: No murmur heard. Pulmonary:     Effort: Pulmonary effort is normal. No respiratory distress.     Breath sounds: Normal breath sounds.  Abdominal:     Palpations: Abdomen is soft. There is no hepatomegaly or mass.     Tenderness: There is no abdominal tenderness.  Genitourinary:    Comments: Deferred to gyn. Musculoskeletal:     Comments: No major deformity or signs of synovitis appreciated.  Lymphadenopathy:     Cervical: No cervical adenopathy.     Upper Body:     Right upper body: No supraclavicular adenopathy.     Left upper body: No supraclavicular adenopathy.  Skin:    General: Skin is warm.     Findings: No erythema or rash.  Neurological:     General: No focal deficit present.     Mental Status: She is alert and oriented to person, place, and time.     Cranial Nerves: No cranial nerve deficit.     Coordination: Coordination normal.     Gait: Gait normal.     Deep Tendon Reflexes:     Reflex Scores:      Bicep reflexes are 2+ on the right side and 2+ on the left side.      Patellar reflexes are 2+ on the right side and 2+ on the left side. Psychiatric:        Mood and Affect: Affect normal. Mood is anxious.   ASSESSMENT AND PLAN: Ms. Panagiota Faul was here today annual physical examination.  Orders Placed This Encounter  Procedures   Comprehensive metabolic panel   Lipid panel   Hemoglobin A1c   VITAMIN D 25 Hydroxy (Vit-D Deficiency, Fractures)   Hepatitis C antibody   Lab  Results  Component Value Date   NA 139 06/04/2023   CL 101 06/04/2023   K 4.8 06/04/2023   CO2 29 06/04/2023   BUN 17 06/04/2023   CREATININE 0.73 06/04/2023   GFR 89.88 06/04/2023   CALCIUM 10.1 06/04/2023   ALBUMIN 4.2 06/04/2023   GLUCOSE 93 06/04/2023   Lab Results  Component Value Date   ALT 32 06/04/2023   AST 31 06/04/2023   ALKPHOS 74 06/04/2023   BILITOT 0.4 06/04/2023   Lab Results  Component Value Date   HGBA1C 6.3 06/04/2023   Routine general medical examination at a health care facility Assessment & Plan: We discussed the importance of regular physical activity and healthy diet for prevention of chronic illness and/or complications. Preventive guidelines reviewed. Vaccination: It seem like she is due for Tdap and second Shingrix, instructed to get it at her pharmacy. Ca++ and vit D supplementation to continue. Continue her female preventive care with her gynecologist. Next CPE in a year.   Hyperlipidemia, unspecified hyperlipidemia type Assessment & Plan: Non pharmacologic treatment recommended for  now. Further recommendations will be given according to 10 years CVD risk score and lipid panel numbers. She would like to continue following every 6 months.  Orders: -     Comprehensive metabolic panel; Future -     Lipid panel; Future  Vitamin D deficiency Assessment & Plan: Continue current dose of vitamin D supplementation. Further recommendation will be given according to 25 OH vitamin D result.  Orders: -     VITAMIN D 25 Hydroxy (Vit-D Deficiency, Fractures); Future  Prediabetes Assessment & Plan: Last hemoglobin A1c was 6.2 in 11/2022. Consistency with a healthy lifestyle encouraged for diabetes prevention.  Orders: -     Hemoglobin A1c; Future  Encounter for HCV screening test for low risk patient -     Hepatitis C antibody; Future   Return in 6 months (on 12/02/2023) for CPE, chronic problems.  Zera Markwardt G. [place], MD  [facility]. [place] office.

## 2023-06-04 NOTE — Assessment & Plan Note (Signed)
Continue current dose of vitamin D supplementation. Further recommendation will be given according to 25 OH vitamin D result. 

## 2023-06-04 NOTE — Assessment & Plan Note (Signed)
Last hemoglobin A1c was 6.2 in 11/2022. Consistency with a healthy lifestyle encouraged for diabetes prevention.

## 2023-06-05 LAB — COMPREHENSIVE METABOLIC PANEL
ALT: 32 U/L (ref 0–35)
AST: 31 U/L (ref 0–37)
Albumin: 4.2 g/dL (ref 3.5–5.2)
Alkaline Phosphatase: 74 U/L (ref 39–117)
BUN: 17 mg/dL (ref 6–23)
CO2: 29 meq/L (ref 19–32)
Calcium: 10.1 mg/dL (ref 8.4–10.5)
Chloride: 101 meq/L (ref 96–112)
Creatinine, Ser: 0.73 mg/dL (ref 0.40–1.20)
GFR: 89.88 mL/min (ref >60.00)
Glucose, Bld: 93 mg/dL (ref 70–99)
Potassium: 4.8 meq/L (ref 3.5–5.1)
Sodium: 139 meq/L (ref 135–145)
Total Bilirubin: 0.4 mg/dL (ref 0.2–1.2)
Total Protein: 7.5 g/dL (ref 6.0–8.3)

## 2023-06-05 LAB — HEPATITIS C ANTIBODY: Hepatitis C Ab: NONREACTIVE

## 2023-06-05 LAB — VITAMIN D 25 HYDROXY (VIT D DEFICIENCY, FRACTURES): VITD: 34.05 ng/mL (ref 30.00–100.00)

## 2023-06-05 LAB — LIPID PANEL
Cholesterol: 183 mg/dL (ref 0–200)
HDL: 45.5 mg/dL (ref >39.00)
LDL Cholesterol: 102 mg/dL — ABNORMAL HIGH (ref 0–99)
NonHDL: 137.17
Total CHOL/HDL Ratio: 4
Triglycerides: 175 mg/dL — ABNORMAL HIGH (ref 0.0–149.0)
VLDL: 35 mg/dL (ref 0.0–40.0)

## 2023-06-05 LAB — HEMOGLOBIN A1C: Hgb A1c MFr Bld: 6.3 % (ref 4.6–6.5)

## 2023-07-09 ENCOUNTER — Other Ambulatory Visit (HOSPITAL_BASED_OUTPATIENT_CLINIC_OR_DEPARTMENT_OTHER): Payer: Self-pay

## 2023-07-09 ENCOUNTER — Encounter (HOSPITAL_BASED_OUTPATIENT_CLINIC_OR_DEPARTMENT_OTHER): Payer: Self-pay | Admitting: Emergency Medicine

## 2023-07-09 ENCOUNTER — Emergency Department (HOSPITAL_BASED_OUTPATIENT_CLINIC_OR_DEPARTMENT_OTHER)
Admission: EM | Admit: 2023-07-09 | Discharge: 2023-07-09 | Disposition: A | Discharge status: Home / Self Care | Payer: 59 | Attending: Emergency Medicine | Admitting: Emergency Medicine

## 2023-07-09 ENCOUNTER — Other Ambulatory Visit: Payer: Self-pay

## 2023-07-09 DIAGNOSIS — Z87891 Personal history of nicotine dependence: Secondary | ICD-10-CM | POA: Diagnosis not present

## 2023-07-09 DIAGNOSIS — M797 Fibromyalgia: Secondary | ICD-10-CM | POA: Insufficient documentation

## 2023-07-09 DIAGNOSIS — U071 COVID-19: Secondary | ICD-10-CM | POA: Insufficient documentation

## 2023-07-09 DIAGNOSIS — Z20822 Contact with and (suspected) exposure to covid-19: Secondary | ICD-10-CM | POA: Diagnosis not present

## 2023-07-09 DIAGNOSIS — R059 Cough, unspecified: Secondary | ICD-10-CM | POA: Insufficient documentation

## 2023-07-09 DIAGNOSIS — R0989 Other specified symptoms and signs involving the circulatory and respiratory systems: Secondary | ICD-10-CM | POA: Insufficient documentation

## 2023-07-09 LAB — RESP PANEL BY RT-PCR (RSV, FLU A&B, COVID)  RVPGX2
Influenza A by PCR: NEGATIVE
Influenza B by PCR: NEGATIVE
Resp Syncytial Virus by PCR: NEGATIVE
SARS Coronavirus 2 by RT PCR: POSITIVE — AB

## 2023-07-09 LAB — GROUP A STREP BY PCR: Group A Strep by PCR: NOT DETECTED

## 2023-07-09 MED ORDER — BENZONATATE 100 MG PO CAPS
100.0000 mg | ORAL_CAPSULE | Freq: Three times a day (TID) | ORAL | 0 refills | Status: DC
  Filled 2023-07-09: qty 21, 7d supply, fill #0

## 2023-07-09 MED ORDER — ACETAMINOPHEN 325 MG PO TABS
650.0000 mg | ORAL_TABLET | Freq: Once | ORAL | Status: AC
  Administered 2023-07-09: 650 mg via ORAL
  Filled 2023-07-09: qty 2

## 2023-07-09 MED ORDER — ALBUTEROL SULFATE HFA 108 (90 BASE) MCG/ACT IN AERS
2.0000 | INHALATION_SPRAY | Freq: Once | RESPIRATORY_TRACT | Status: AC
  Administered 2023-07-09: 2 via RESPIRATORY_TRACT
  Filled 2023-07-09: qty 6.7

## 2023-07-09 MED ORDER — AEROCHAMBER PLUS FLO-VU MEDIUM MISC: 1.0000 | Freq: Once | Status: DC

## 2023-07-09 MED ORDER — AEROCHAMBER PLUS FLO-VU MEDIUM MISC
1.0000 | Freq: Once | Status: AC
  Administered 2023-07-09: 1
  Filled 2023-07-09: qty 1

## 2023-07-09 MED ORDER — PAXLOVID (300/100) 20 X 150 MG & 10 X 100MG PO TBPK
3.0000 | ORAL_TABLET | Freq: Two times a day (BID) | ORAL | 0 refills | Status: AC
  Filled 2023-07-09: qty 30, 5d supply, fill #0

## 2023-07-09 NOTE — ED Notes (Signed)
RT Note: Patient was educated on the use of an Albuterol inhaler with the spacer for relief of symptoms and frequency to use.

## 2023-07-09 NOTE — ED Provider Notes (Signed)
[place] EMERGENCY DEPARTMENT AT [facility] Provider Note   CSN: [id number] Arrival date & time: 07/09/23  1308     History  Chief Complaint  Patient presents with   Sore Throat    [name] is a 57 y.o. female.  Patient here with cough and ingestion.  Exposure to COVID.  Denies any fever chills.  Symptoms started yesterday.  History of fibromyalgia, chronic pain, anxiety depression.  Former smoker.  Denies any shortness of breath chest pain weakness numbness tingling.  The history is provided by the patient.       Home Medications Prior to Admission medications   Medication Sig Start Date End Date Taking? Authorizing Provider  benzonatate (TESSALON) 100 MG capsule Take 1 capsule (100 mg total) by mouth every 8 (eight) hours. 07/09/23  Yes Kentrell Hallahan, DO  nirmatrelvir/ritonavir (PAXLOVID, 300/100,) 20 x 150 MG & 10 x 100MG  TBPK Take 3 tablets by mouth 2 (two) times daily for 5 days. Patient GFR is > 60. Take nirmatrelvir (150 mg) two tablets twice daily for 5 days and ritonavir (100 mg) one tablet twice daily for 5 days. 07/09/23 07/14/23 Yes Verdia Bolt, DO  acetaminophen (TYLENOL) 500 MG tablet Take 1,000 mg by mouth every 6 (six) hours as needed for mild pain or moderate pain.    [provider]  antiseptic oral rinse (BIOTENE) LIQD 15 mLs by Mouth Rinse route as needed for dry mouth.    [provider]  Benzocaine (KANK-A MT) Use as directed 1 Application in the mouth or throat as needed (oral sores).    [provider]  buprenorphine (BUTRANS) 15 MCG/HR Place 1 patch onto the skin once a week. 06/21/21   [provider]  DULoxetine (CYMBALTA) 60 MG capsule Take 60 mg by mouth every evening.    [provider]  fluticasone (FLONASE) 50 MCG/ACT nasal spray SPRAY 2 SPRAYS INTO EACH NOSTRIL EVERY DAY 05/31/23   [place], Betty G, MD  gabapentin (NEURONTIN) 600 MG tablet Take 1,200 mg by mouth 3 (three) times  daily. 01/23/19   [provider]  hydrOXYzine (ATARAX) 25 MG tablet Take 25 mg by mouth 2 (two) times daily as needed. 04/05/23   [provider]  HYDROXYZINE HCL PO Take by mouth.    [provider]  meloxicam (MOBIC) 15 MG tablet Take 15 mg by mouth in the morning. 02/16/21   [provider]  methocarbamol (ROBAXIN) 500 MG tablet Take 500 mg by mouth in the morning and at bedtime. 07/10/22   [provider]  Multiple Vitamin (MULTIVITAMIN WITH MINERALS) TABS tablet Take 1 tablet by mouth daily. One A Day Multivitamin    [provider]  tobramycin-dexamethasone (TOBRADEX) ophthalmic solution Apply to eye. 02/08/23   [provider]  traZODone (DESYREL) 100 MG tablet Take 100 mg by mouth at bedtime. 04/04/21   [provider]      Allergies    Pneumovax 23 [pneumococcal vac polyvalent] and Seroquel [quetiapine fumarate]    Review of Systems   Review of Systems  Physical Exam Updated Vital Signs BP (!) 148/96 (BP Location: Right Arm)   Pulse 96   Temp 98.2 F (36.8 C)   Resp 19   LMP 07/17/2014 Comment: Pt stated that she is going through menopause and that she will go for 6-8 months without a period and then have it twice a month.   SpO2 100%  Physical Exam Vitals and nursing note reviewed.  Constitutional:  General: She is not in acute distress.    Appearance: She is well-developed. She is not ill-appearing.  HENT:     Head: Normocephalic and atraumatic.     Mouth/Throat:     Mouth: Mucous membranes are moist. No oral lesions.     Pharynx: No pharyngeal swelling, oropharyngeal exudate or posterior oropharyngeal erythema.     Tonsils: No tonsillar exudate or tonsillar abscesses.  Eyes:     Conjunctiva/sclera: Conjunctivae normal.  Cardiovascular:     Rate and Rhythm: Normal rate and regular rhythm.     Heart sounds: No murmur heard. Pulmonary:     Effort: Pulmonary effort is normal. No respiratory  distress.     Breath sounds: Normal breath sounds.  Abdominal:     Palpations: Abdomen is soft.     Tenderness: There is no abdominal tenderness.  Musculoskeletal:        General: No swelling.     Cervical back: Neck supple.  Skin:    General: Skin is warm and dry.     Capillary Refill: Capillary refill takes less than 2 seconds.  Neurological:     Mental Status: She is alert.  Psychiatric:        Mood and Affect: Mood normal.     ED Results / Procedures / Treatments   Labs (all labs ordered are listed, but only abnormal results are displayed) Labs Reviewed  RESP PANEL BY RT-PCR (RSV, FLU A&B, COVID)  [id number] - Abnormal; Notable for the following components:      Result Value   SARS Coronavirus 2 by RT PCR POSITIVE (*)    All other components within normal limits  GROUP A STREP BY PCR    EKG None  Radiology No results found.  Procedures Procedures    Medications Ordered in ED Medications  albuterol (VENTOLIN HFA) 108 (90 Base) MCG/ACT inhaler 2 puff (has no administration in time range)  acetaminophen (TYLENOL) tablet 650 mg (has no administration in time range)    ED Course/ Medical Decision Making/ A&P                                 Medical Decision Making Risk OTC drugs. Prescription drug management.   [name] is here with flulike symptoms.  Normal vitals.  No fever.  Comorbidities of fibromyalgia and chronic pain.  Differential diagnosis likely flu versus COVID.  Have no concern for pneumonia or other acute process.  COVID test is positive.  Strep test negative.  Throat exam is unremarkable.  Albuterol given to help with cough.  Will prescribe Paxlovid and Tessalon Perles.  Recommend follow-up with primary care doctor.  Understands return precautions.  Discharged in good condition.  This chart was dictated using voice recognition software.  Despite best efforts to proofread,  errors can occur which can change the documentation meaning.           Final Clinical Impression(s) / ED Diagnoses Final diagnoses:  COVID    Rx / DC Orders ED Discharge Orders          Ordered    benzonatate (TESSALON) 100 MG capsule  Every 8 hours        07/09/23 1156    nirmatrelvir/ritonavir (PAXLOVID, 300/100,) 20 x 150 MG & 10 x 100MG  TBPK  2 times daily        07/09/23 1156  Virgina Norfolk, DO 07/09/23 1200

## 2023-07-09 NOTE — ED Triage Notes (Signed)
Sore throat, congestion, cough, body aches. Friend has covid.

## 2023-07-31 DIAGNOSIS — G894 Chronic pain syndrome: Secondary | ICD-10-CM | POA: Diagnosis not present

## 2023-07-31 DIAGNOSIS — E882 Lipomatosis, not elsewhere classified: Secondary | ICD-10-CM | POA: Diagnosis not present

## 2023-07-31 DIAGNOSIS — M797 Fibromyalgia: Secondary | ICD-10-CM | POA: Diagnosis not present

## 2023-07-31 DIAGNOSIS — M5412 Radiculopathy, cervical region: Secondary | ICD-10-CM | POA: Diagnosis not present

## 2023-07-31 DIAGNOSIS — Z79899 Other long term (current) drug therapy: Secondary | ICD-10-CM | POA: Diagnosis not present

## 2023-08-02 DIAGNOSIS — H04123 Dry eye syndrome of bilateral lacrimal glands: Secondary | ICD-10-CM | POA: Diagnosis not present

## 2023-08-02 DIAGNOSIS — Q120 Congenital cataract: Secondary | ICD-10-CM | POA: Diagnosis not present

## 2023-08-02 DIAGNOSIS — H5213 Myopia, bilateral: Secondary | ICD-10-CM | POA: Diagnosis not present

## 2023-08-02 DIAGNOSIS — H52203 Unspecified astigmatism, bilateral: Secondary | ICD-10-CM | POA: Diagnosis not present

## 2023-08-02 DIAGNOSIS — H524 Presbyopia: Secondary | ICD-10-CM | POA: Diagnosis not present

## 2023-08-02 DIAGNOSIS — H2513 Age-related nuclear cataract, bilateral: Secondary | ICD-10-CM | POA: Diagnosis not present

## 2023-08-09 ENCOUNTER — Telehealth: Payer: Self-pay

## 2023-08-09 NOTE — Telephone Encounter (Signed)
Transition Care Management Unsuccessful Follow-up Telephone Call  Date of discharge and from where:  07/09/2023 [facility]  Attempts:  1st Attempt  Reason for unsuccessful TCM follow-up call:  Left voice message  Donne Robillard [facility]  [facility], [facility] Guide Direct Dial: [phone]  Website: [email or web address]

## 2023-08-12 ENCOUNTER — Telehealth: Payer: Self-pay

## 2023-08-12 NOTE — Telephone Encounter (Signed)
Transition Care Management Unsuccessful Follow-up Telephone Call  Date of discharge and from where:  07/09/2023 [facility]  Attempts:  2nd Attempt  Reason for unsuccessful TCM follow-up call:  Left voice message  Lacorey Brusca [facility]  [facility], [facility] Guide Direct Dial: [phone]  Website: [email or web address]

## 2023-08-31 IMAGING — MR MR HUMERUS*R* WO/W CM
7 of 10 series · 27 of 40 positions shown · IV contrast (Multihance 20cc)
Comparison: Ultrasound of the region of concern 08/21/2020.

CLINICAL DATA: The patient reports a lump in the right shoulder
since receiving vaccination in June 2021.

EXAM:
MRI OF THE RIGHT HUMERUS WITHOUT AND WITH CONTRAST
TECHNIQUE: Multiplanar, multisequence MR imaging of the right humerus was
performed before and after the administration of intravenous
contrast.
CONTRAST:  20 mL MULTIHANCE GADOBENATE DIMEGLUMINE 529 MG/ML IV SOLN

[Series 9: T2 fat-sat · axial · left · 5.0mm · 0.75mm/px · z∈[-103,+202]mm · 4 of 53 slices shown]
[im 1/53]
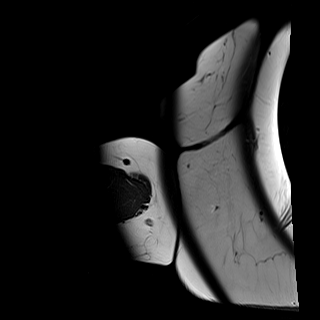
[im 18/53]
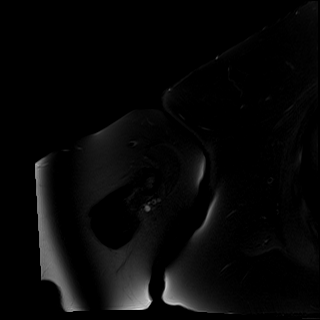
[im 35/53]
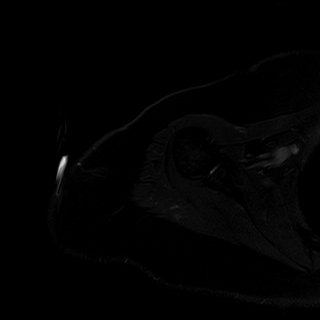
[im 53/53]
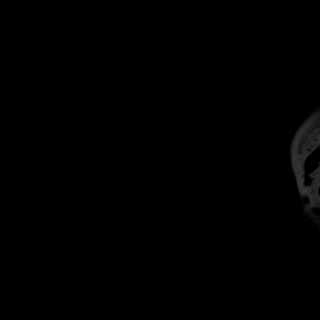

[Series 10: T1 · axial · left · 5.0mm · 0.75mm/px · z∈[-103,+202]mm · 4 of 53 slices shown (1 of 3)]
[im 1/53]
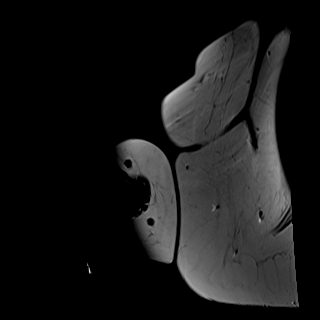
[im 18/53]
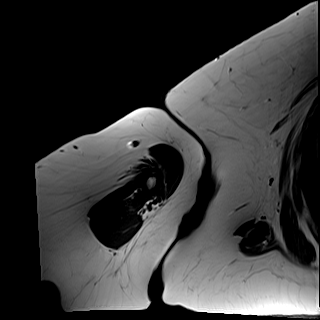
[im 35/53]
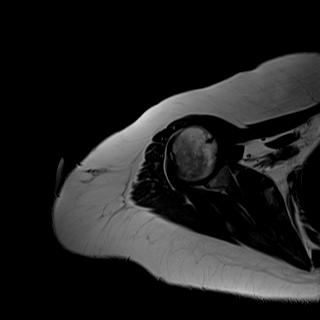
[im 53/53]
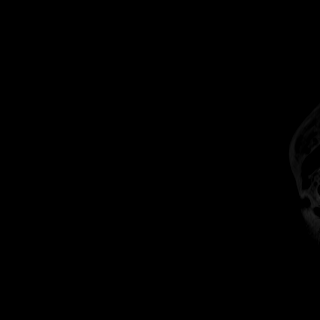

[Series 12: T1 · sagittal · left · 4.0mm · 0.39mm/px · 4 of 43 slices shown (2 of 3)]
[im 1/43]
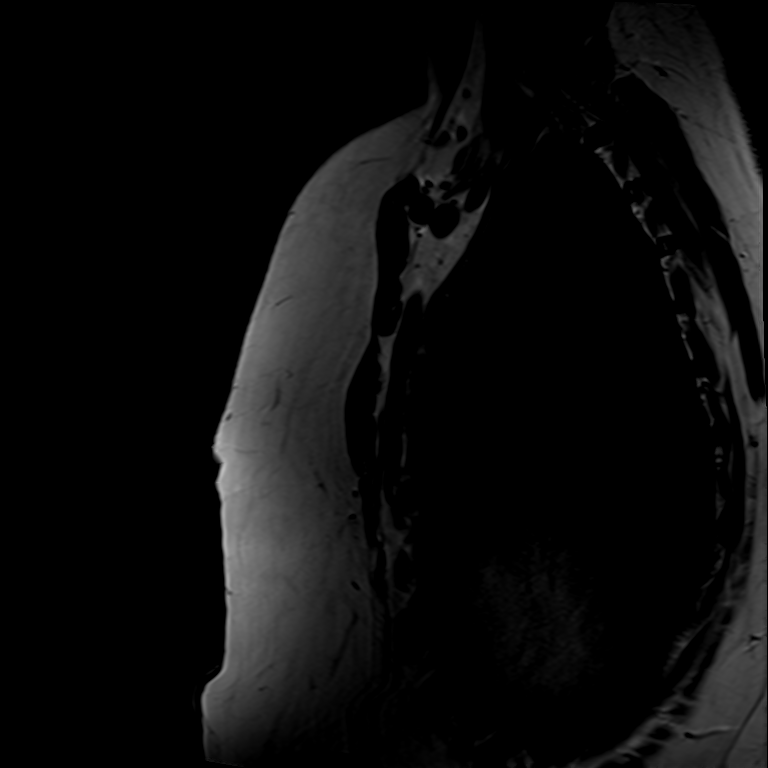
[im 15/43]
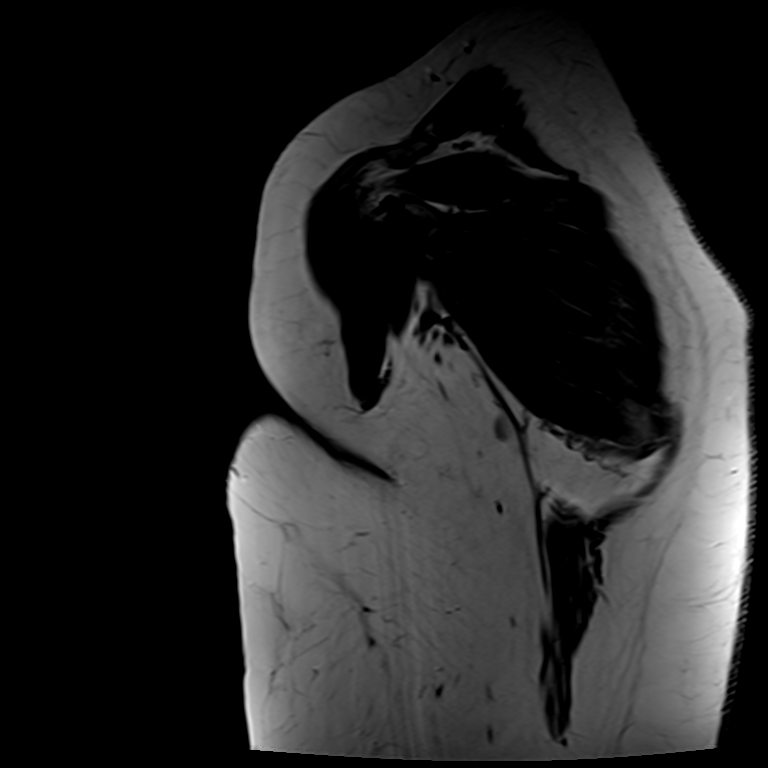
[im 29/43]
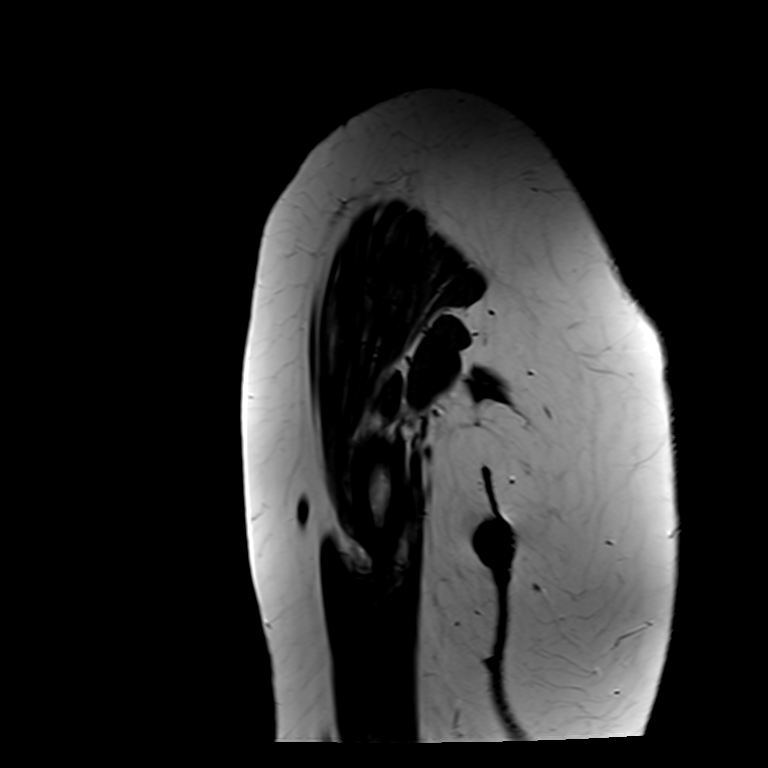
[im 43/43]
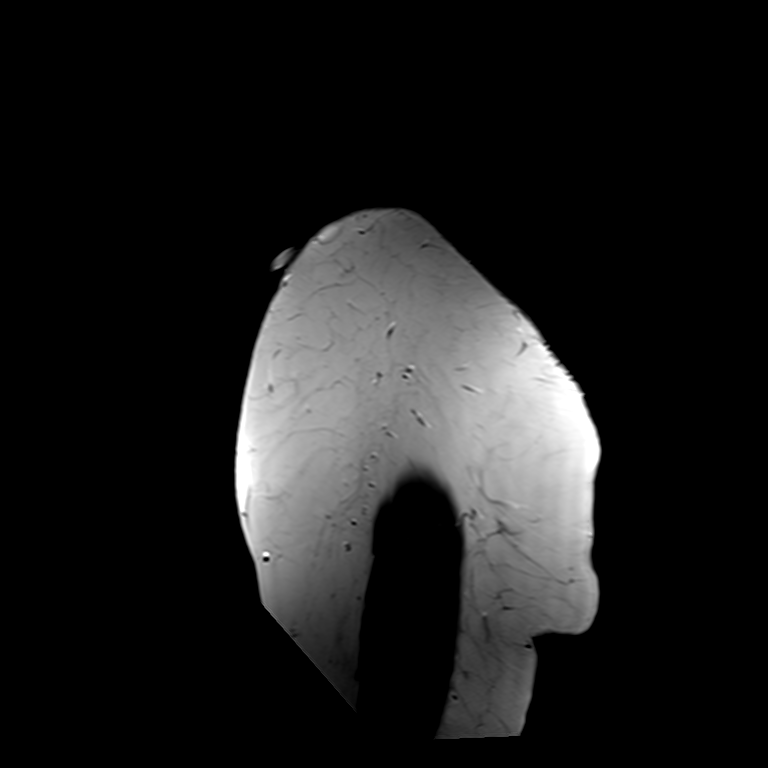

[Series 13: T1 · oblique · right · 4.0mm · 0.94mm/px · 3 of 35 slices shown (3 of 3)]
[im 1/35]
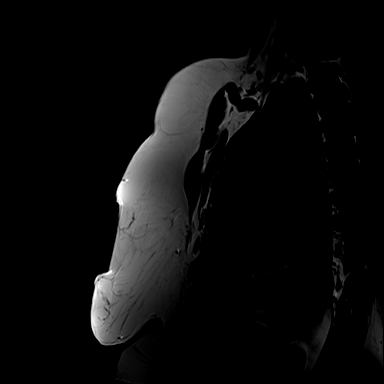
[im 18/35]
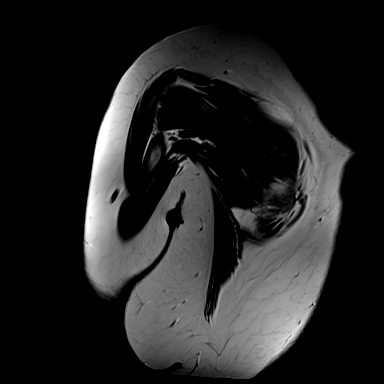
[im 35/35]
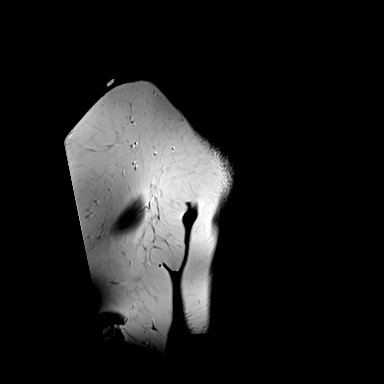

[Series 14: T1 fat-sat · axial · left · 5.0mm · 0.62mm/px · z∈[-109,+197]mm · 5 of 53 slices shown]
[im 1/53]
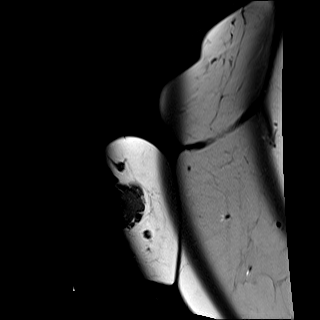
[im 14/53]
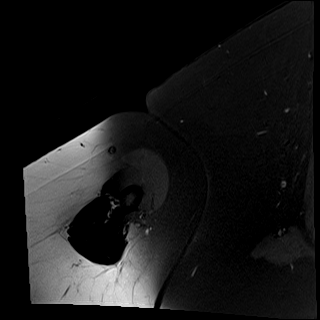
[im 27/53]
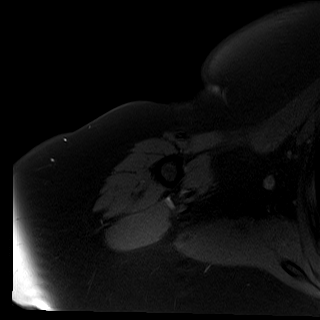
[im 40/53]
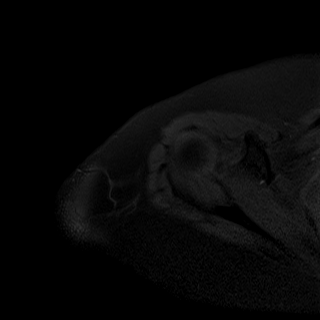
[im 53/53]
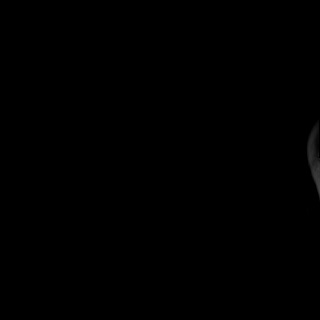

[Series 16: T1 fat-sat post-contrast · axial · left · 5.0mm · 0.62mm/px · z∈[-109,+197]mm · 5 of 53 slices shown (1 of 2)]
[im 1/53]
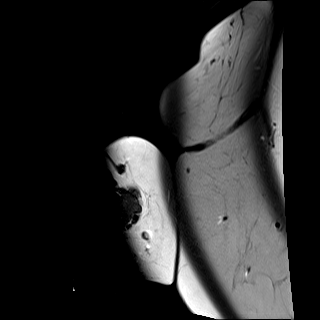
[im 14/53]
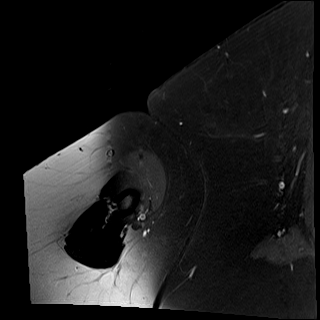
[im 27/53]
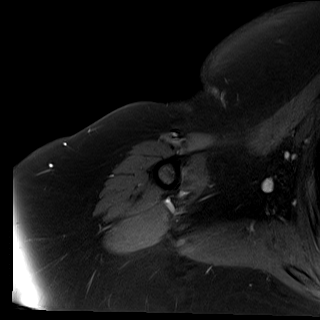
[im 40/53]
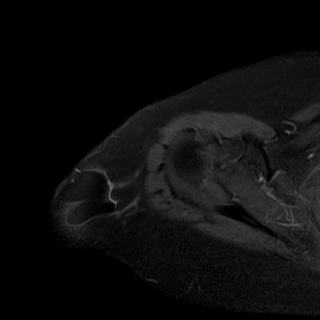
[im 53/53]
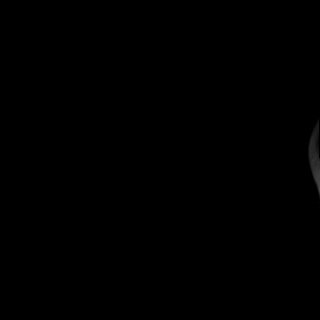

[Series 17: T1 fat-sat post-contrast · oblique · left · 4.0mm · 0.78mm/px · 2 of 35 slices shown (2 of 2)]
[im 1/35]
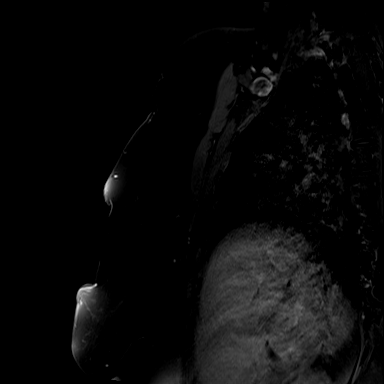
[im 18/35]
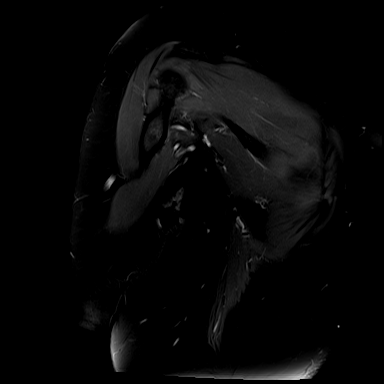

[27 of 40 positions shown; findings below may reference images not displayed]

FINDINGS: Bones/Joint/Cartilage

No fracture, stress change or worrisome lesion is identified.
Moderate acromioclavicular osteoarthritis is noted. No
acromioclavicular or glenohumeral joint effusion.

Ligaments

Negative.

Muscles and Tendons

Intact. A thin cystic lesion along the subscapularis measures 0.7 cm
craniocaudal by 2.7 cm transverse by 0.5 cm AP.

Soft tissues

Markers are placed about the region of concern. Subjacent to the
markers, there is a lesion measuring approximately 3.4 cm transverse
by 3.1 cm AP by 1.5 cm craniocaudal. The lesion is markedly
hypointense on T1 fat saturated imaging, hyperintense to fat on T1
weighted imaging and has a thin rim of enhancement. The lesion is
immediately subjacent to the skin.
IMPRESSION: Lesion in the region of concern is nonspecific but does not have an
appearance at all suggestive of an aggressive neoplasm. Favored
diagnosis is fat necrosis. Signal characteristics are also typical
for lipoma but the lesion is clearly fluid containing on the prior
MRI. As it is immediately subjacent to subcutaneous tissue common
should be amenable to aspiration.

Moderate acromioclavicular osteoarthritis.

Cystic lesion at the musculotendinous junction of the subscapularis
is likely a ganglion.

## 2023-09-16 DIAGNOSIS — M797 Fibromyalgia: Secondary | ICD-10-CM | POA: Diagnosis not present

## 2023-09-16 DIAGNOSIS — G894 Chronic pain syndrome: Secondary | ICD-10-CM | POA: Diagnosis not present

## 2023-09-16 DIAGNOSIS — E882 Lipomatosis, not elsewhere classified: Secondary | ICD-10-CM | POA: Diagnosis not present

## 2023-09-16 DIAGNOSIS — Z79899 Other long term (current) drug therapy: Secondary | ICD-10-CM | POA: Diagnosis not present

## 2023-11-11 ENCOUNTER — Ambulatory Visit: Payer: Self-pay | Admitting: Family Medicine

## 2023-11-11 NOTE — Telephone Encounter (Signed)
 Chief Complaint: Fatigue Symptoms: SOB with activity Frequency: Progressively worse Pertinent Negatives: Patient denies gastric reflux, indigestion, chest pain, fever, cough, vomiting Disposition: [] ED /[] Urgent Care (no appt availability in office) / [x] Appointment(In office/virtual)/ []  [facility]/ [] Home Care/ [] Refused Recommended Disposition /[] [facility]/ []  Follow-up with PCP Additional Notes: Pt states she normally has fatigue but it has gotten worse. Pt states she had a cold a few weeks ago. Pt states her hands shake when she writes and her legs have felt wobbly. Pt feels like her heart rate has felt fast. Pt states she has always been borderline for diabetes and is not sure if this could be due to her sugar. Pt states she is eating and drinking normally. An appt was scheduled for tomorrow. This RN educated pt on home care, new-worsening symptoms, when to call back/seek emergent care. Pt verbalized understanding and agrees to plan.   Reason for Disposition  [1] MODERATE weakness (i.e., interferes with work, school, normal activities) AND [2] cause unknown  (Exceptions: Weakness from acute minor illness or poor fluid intake; weakness is chronic and not worse.)  [1] MODERATE weakness (i.e., interferes with work, school, normal activities) AND [2] persists > 3 days  Answer Assessment - Initial Assessment Questions 1. DESCRIPTION: "Describe how you are feeling."     Fatigue 2. SEVERITY: "How bad is it?"  "Can you stand and walk?"   - MILD (0-3): Feels weak or tired, but does not interfere with work, school or normal activities.   - MODERATE (4-7): Able to stand and walk; weakness interferes with work, school, or normal activities.   - SEVERE (8-10): Unable to stand or walk; unable to do usual activities.     Moderate 3. ONSET: "When did these symptoms begin?" (e.g., hours, days, weeks, months)     Noticing for past month but getting worse 5. NEW MEDICINES:  "Have  you started on any new medicines recently?" (e.g., opioid pain medicines, benzodiazepines, muscle relaxants, antidepressants, antihistamines, neuroleptics, beta blockers)     No 6. OTHER SYMPTOMS: "Do you have any other symptoms?" (e.g., chest pain, fever, cough, SOB, vomiting, diarrhea, bleeding, other areas of pain)     SOB  Protocols used: Weakness (Generalized) and Fatigue-A-AH

## 2023-11-11 NOTE — Telephone Encounter (Signed)
 This RN made first attempt to triage. No answer, left a message. Will continue to attempt.  Copied From CRM [id number]. Reason for Triage: Patient calling to schedule appointment stating the symptoms have become worse - states it may be due to her sugar levels - sleeping all the time & no energy for anything.

## 2023-11-12 ENCOUNTER — Ambulatory Visit: Payer: 59 | Admitting: Family Medicine

## 2023-11-15 ENCOUNTER — Encounter: Payer: Self-pay | Admitting: Family Medicine

## 2023-11-15 ENCOUNTER — Ambulatory Visit: Payer: 59 | Admitting: Family Medicine

## 2023-11-15 VITALS — BP 128/80 | HR 97 | Temp 97.9°F | Resp 16 | Ht 64.0 in | Wt 236.2 lb

## 2023-11-15 DIAGNOSIS — R251 Tremor, unspecified: Secondary | ICD-10-CM | POA: Diagnosis not present

## 2023-11-15 DIAGNOSIS — R5382 Chronic fatigue, unspecified: Secondary | ICD-10-CM | POA: Diagnosis not present

## 2023-11-15 DIAGNOSIS — R7303 Prediabetes: Secondary | ICD-10-CM | POA: Diagnosis not present

## 2023-11-15 DIAGNOSIS — R002 Palpitations: Secondary | ICD-10-CM

## 2023-11-15 DIAGNOSIS — E559 Vitamin D deficiency, unspecified: Secondary | ICD-10-CM

## 2023-11-15 DIAGNOSIS — R0609 Other forms of dyspnea: Secondary | ICD-10-CM | POA: Diagnosis not present

## 2023-11-15 LAB — CBC
HCT: 43.8 % (ref 36.0–46.0)
Hemoglobin: 14.9 g/dL (ref 12.0–15.0)
MCHC: 33.9 g/dL (ref 30.0–36.0)
MCV: 94 fL (ref 78.0–100.0)
Platelets: 370 10*3/uL (ref 150.0–400.0)
RBC: 4.66 Mil/uL (ref 3.87–5.11)
RDW: 12.5 % (ref 11.5–15.5)
WBC: 7.8 10*3/uL (ref 4.0–10.5)

## 2023-11-15 LAB — COMPREHENSIVE METABOLIC PANEL
ALT: 34 U/L (ref 0–35)
AST: 31 U/L (ref 0–37)
Albumin: 4.2 g/dL (ref 3.5–5.2)
Alkaline Phosphatase: 71 U/L (ref 39–117)
BUN: 19 mg/dL (ref 6–23)
CO2: 30 meq/L (ref 19–32)
Calcium: 9 mg/dL (ref 8.4–10.5)
Chloride: 99 meq/L (ref 96–112)
Creatinine, Ser: 0.8 mg/dL (ref 0.40–1.20)
GFR: 80.28 mL/min (ref >60.00)
Glucose, Bld: 188 mg/dL — ABNORMAL HIGH (ref 70–99)
Potassium: 3.6 meq/L (ref 3.5–5.1)
Sodium: 138 meq/L (ref 135–145)
Total Bilirubin: 0.3 mg/dL (ref 0.2–1.2)
Total Protein: 7.4 g/dL (ref 6.0–8.3)

## 2023-11-15 LAB — C-REACTIVE PROTEIN: CRP: 2 mg/dL (ref 0.5–20.0)

## 2023-11-15 LAB — HEMOGLOBIN A1C: Hgb A1c MFr Bld: 6.5 % (ref 4.6–6.5)

## 2023-11-15 LAB — VITAMIN D 25 HYDROXY (VIT D DEFICIENCY, FRACTURES): VITD: 27.36 ng/mL — ABNORMAL LOW (ref 30.00–100.00)

## 2023-11-15 LAB — TSH: TSH: 1.88 u[IU]/mL (ref 0.35–5.50)

## 2023-11-15 NOTE — Patient Instructions (Addendum)
A few things to remember from today's visit:  Exertional dyspnea - Plan: Ambulatory referral to Cardiology  Chronic fatigue - Plan: Comprehensive metabolic panel, CBC  Tremor of both hands - Plan: Comprehensive metabolic panel, CBC, C-reactive protein, TSH  Vitamin D deficiency  Prediabetes - Plan: Hemoglobin A1c  Palpitations - Plan: EKG 12-Lead, Ambulatory referral to Cardiology  Vitamin D deficiency, unspecified - Plan: VITAMIN D 25 Hydroxy (Vit-D Deficiency, Fractures)  If you need refills for medications you take chronically, please call your pharmacy. Do not use My Chart to request refills or for acute issues that need immediate attention. If you send a my chart message, it may take a few days to be addressed, specially if I am not in the office.  Please be sure medication list is accurate. If a new problem present, please set up appointment sooner than planned today.

## 2023-11-15 NOTE — Progress Notes (Signed)
 ACUTE VISIT Chief Complaint  Patient presents with   Fatigue   HPI: [name] is a 58 y.o. female with a PMHx significant for chronic pain syndrome, prediabetes, vitamin D deficiency, anxiety, depression, and fibromyalgia, who is here today complaining of worsening fatigue.   Patient complains of extreme fatigue over the last month. She says she has chronic fatigue, but this is much worse than her usual.  She says she is sleeping a total of 12-14 hours per day, including from ~1 am to 9 or 10 am, and then napping for 4-6 hours through the afternoon. States that she does not wake up feeling tired, fatigue onset a couple hours after waking up.   Currently on trazodone 100 mg for sleep as well as hydroxyzine 25 mg twice daily prn and duloxetine 60 mg daily for anxiety.  Anxiety and depression: Follows with psychiatrist regularly.  In addition to the fatigue, she says she is having frequent "weakness", palpitations while resting, occasional SOB when active, and some tremors in her hands and legs. No FMHx of essential tremors or parkinson.   She does not have a history of OSA, but has been told she snores by her daughter.   Pertinent negatives include changes in her medications, unusual headaches, numbness, tingling, focal weakness, CP, abdominal pain, nausea, vomiting, or changes in bowel habits. .   Additionally, she complains of increased chronic generalized pain. She will be following with pain management in 11/2023.  Currently she is on Gabapentin, Meloxicam, and Buprenorphine 15 mg patch.  Prediabetes with last HgA1C 6.3 in 06/2023. Vit D deficiency on a daily multivitamin.  Review of Systems  Constitutional:  Positive for activity change and fatigue. Negative for appetite change, fever and unexpected weight change.  HENT:  Negative for mouth sores and sore throat.   Respiratory:  Negative for cough and wheezing.   Cardiovascular:  Negative for leg swelling.   Endocrine: Negative for cold intolerance and heat intolerance.  Genitourinary:  Negative for decreased urine volume, dysuria and hematuria.  Musculoskeletal:  Positive for arthralgias and myalgias.  Skin:  Negative for rash.  Neurological:  Negative for syncope and facial asymmetry.  Psychiatric/Behavioral:  Negative for confusion and hallucinations. The patient is nervous/anxious.   See other pertinent positives and negatives in HPI.  Current Outpatient Medications on File Prior to Visit  Medication Sig Dispense Refill   acetaminophen (TYLENOL) 500 MG tablet Take 1,000 mg by mouth every 6 (six) hours as needed for mild pain or moderate pain.     antiseptic oral rinse (BIOTENE) LIQD 15 mLs by Mouth Rinse route as needed for dry mouth.     Benzocaine (KANK-A MT) Use as directed 1 Application in the mouth or throat as needed (oral sores).     benzonatate (TESSALON) 100 MG capsule Take 1 capsule (100 mg total) by mouth every 8 (eight) hours. 21 capsule 0   buprenorphine (BUTRANS) 15 MCG/HR Place 1 patch onto the skin once a week.     DULoxetine (CYMBALTA) 60 MG capsule Take 60 mg by mouth every evening.     fluticasone (FLONASE) 50 MCG/ACT nasal spray SPRAY 2 SPRAYS INTO EACH NOSTRIL EVERY DAY 48 mL 1   gabapentin (NEURONTIN) 600 MG tablet Take 1,200 mg by mouth 3 (three) times daily.     hydrOXYzine (ATARAX) 25 MG tablet Take 25 mg by mouth 2 (two) times daily as needed.     HYDROXYZINE HCL PO Take by mouth.     meloxicam (  MOBIC) 15 MG tablet Take 15 mg by mouth in the morning.     methocarbamol (ROBAXIN) 500 MG tablet Take 500 mg by mouth in the morning and at bedtime.     Multiple Vitamin (MULTIVITAMIN WITH MINERALS) TABS tablet Take 1 tablet by mouth daily. One A Day Multivitamin     tobramycin-dexamethasone (TOBRADEX) ophthalmic solution Apply to eye.     traZODone (DESYREL) 100 MG tablet Take 100 mg by mouth at bedtime.     No current facility-administered medications on file prior to  visit.    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   Allergies  Allergen Reactions   Pneumovax 23 [Pneumococcal Vac Polyvalent] Other (See Comments)    Arm mass   Seroquel [Quetiapine Fumarate] Nausea Only    Nausea, headache and lethargy    Social History   Socioeconomic History   Marital status: Divorced    Spouse name: Not on file   Number of children: 3   Years of education: Not on file   Highest education level: Not on file  Occupational History   Not on file  Tobacco Use   Smoking status: Former    Current packs/day: 0.00    Average packs/day: 1 pack/day for 43.2 years (43.2 ttl pk-yrs)    Types: Cigarettes    Start date: 59    Quit date: 12/01/2022    Years since quitting: 0.9    Passive exposure: Current   Smokeless tobacco: Never  Vaping Use   Vaping status: Never Used  Substance and Sexual Activity   Alcohol use: No   Drug use: No   Sexual activity: Not Currently  Other Topics Concern   Not on file  Social History Narrative   Not on file   Social Drivers of Health   Financial Resource Strain: Low Risk  (02/12/2022)   Overall Financial Resource Strain (CARDIA)    Difficulty of Paying Living Expenses: Not hard at all  Food Insecurity: No Food Insecurity (02/14/2023)   Hunger Vital Sign    Worried About Running Out of Food in the Last Year: Never true    Ran Out of Food in the Last Year: Never true  Transportation Needs: No Transportation Needs (02/14/2023)   PRAPARE - Administrator, Civil Service (Medical): No    Lack of Transportation  (Non-Medical): No  Physical Activity: Unknown (02/14/2023)   Exercise Vital Sign    Days of Exercise per Week: 0 days    Minutes of Exercise per Session: Patient unable to answer  Stress: Stress Concern Present (02/14/2023)   [employer] of Occupational Health - Occupational Stress Questionnaire    Feeling of Stress : Very much  Social Connections: Unknown (02/14/2023)   Social Connection and Isolation Panel [NHANES]    Frequency of Communication with Friends and Family: Twice a week    Frequency of Social Gatherings with Friends and Family: Patient unable to answer    Attends Religious  Services: Patient unable to answer    Active Member of Clubs or Organizations: Yes    Attends Club or Organization Meetings: Patient unable to answer    Marital Status: Divorced    Vitals:   11/15/23 1251  BP: 128/80  Pulse: 97  Resp: 16  Temp: 97.9 F (36.6 C)  SpO2: 99%   Body mass index is 40.55 kg/m.  Physical Exam Vitals and nursing note reviewed.  Constitutional:      General: She is not in acute distress.    Appearance: She is well-developed.  HENT:     Head: Normocephalic and atraumatic.     Mouth/Throat:     Mouth: Mucous membranes are moist.     Pharynx: Oropharynx is clear. Uvula midline.  Eyes:     Conjunctiva/sclera: Conjunctivae normal.  Cardiovascular:     Rate and Rhythm: Normal rate and regular rhythm.     Pulses:          Dorsalis pedis pulses are 2+ on the right side and 2+ on the left side.     Heart sounds: No murmur heard. Pulmonary:     Effort: Pulmonary effort is normal. No respiratory distress.     Breath sounds: Normal breath sounds.  Abdominal:     Palpations: Abdomen is soft. There is no mass.     Tenderness: There is no abdominal tenderness.  Musculoskeletal:     Right lower leg: No edema.     Left lower leg: No edema.  Lymphadenopathy:     Cervical: No cervical adenopathy.  Skin:    General: Skin is warm.     Findings: No erythema or rash.   Neurological:     General: No focal deficit present.     Mental Status: She is alert and oriented to person, place, and time.     Cranial Nerves: No cranial nerve deficit.     Motor: Tremor present.     Gait: Gait normal.     Comments: Fine bilateral hand tremor, not present at rest.   Psychiatric:        Mood and Affect: Affect normal. Mood is anxious.    ASSESSMENT AND PLAN:  [name] was seen today for fatigue.   Lab Results  Component Value Date   NA 138 11/15/2023   CL 99 11/15/2023   K 3.6 11/15/2023   CO2 30 11/15/2023   BUN 19 11/15/2023   CREATININE 0.80 11/15/2023   GFR 80.28 11/15/2023   CALCIUM 9.0 11/15/2023   ALBUMIN 4.2 11/15/2023   GLUCOSE 188 (H) 11/15/2023   Lab Results  Component Value Date   HGBA1C 6.5 11/15/2023   Lab Results  Component Value Date   VD25OH 27.36 (L) 11/15/2023   Lab Results  Component Value Date   WBC 7.8 11/15/2023   HGB 14.9 11/15/2023   HCT 43.8 11/15/2023   MCV 94.0 11/15/2023   PLT 370.0 11/15/2023   Lab Results  Component Value Date   TSH 1.88 11/15/2023   Lab Results  Component Value Date   CRP 2.0 11/15/2023  Exertional dyspnea Assessment & Plan: She has been evaluated by cardiologist in the past for this problem, about 5 years ago, 07/2019. Echo with LVEF 58% and grade I diastolic dysfunction. EKG today with SR, normal axis, unspecific T wave changes that are new when compared with prior EKG's. Reporting new onset palpitations. Instructed about warning signs. Cardiology referral placed.  Orders: -     Ambulatory referral to Cardiology  Chronic fatigue Assessment & Plan: Getting worse for the past month or so. A few of her co morbilities and medications could be aggravating problem.  She will decrease Trazodone from 100 mg to 50 mg daily and continue weaning off as tolerated, stop Hydroxyzine and continue as needed for acute anxiety.  Examination today does not suggest a serious process. Further  recommendations will be given according to lab results.  Orders: -     Comprehensive metabolic panel; Future -     CBC; Future  Prediabetes Assessment & Plan: Last hemoglobin A1c was 6.2 in 06/2023. Consistency with a healthy lifestyle encouraged for diabetes prevention.  Orders: -     Hemoglobin A1c; Future  Tremor of both hands We discussed possible etiologies. ? Essential tremor. Monitor for new symptoms. If worsening, we can consider neuro consultation.  -     Comprehensive metabolic panel; Future -     CBC; Future -     C-reactive protein; Future -     TSH; Future  Vitamin D deficiency Assessment & Plan: Could be contributing to fatigue. Continue current dose of vitamin D supplementation. Further recommendation will be given according to 25 OH vitamin D result.  Palpitations With DOE and worsening fatigue. Cardiology referral placed.  -     EKG 12-Lead -     Ambulatory referral to Cardiology  Vitamin D deficiency, unspecified Assessment & Plan: Could be contributing to fatigue. Continue current dose of vitamin D supplementation. Further recommendation will be given according to 25 OH vitamin D result.  Orders: -     VITAMIN D 25 Hydroxy (Vit-D Deficiency, Fractures); Future  I spent a total of 32 minutes in both face to face and non face to face activities for this visit on the date of this encounter. During this time history was obtained and documented, examination was performed, prior labs/imaging reviewed, and assessment/plan discussed.  Return if symptoms worsen or fail to improve, for keep next appointment.  I, Rolla Etienne Wierda, acting as a scribe for Mycala Warshawsky [place], MD., have documented all relevant documentation on the behalf of Gail Spaziani [place], MD, as directed by  Kaylany Tesoriero [place], MD while in the presence of Rendon Howell [place], MD.   I, Leigh Blas [place], MD, have reviewed all documentation for this visit. The documentation on 11/15/23 for the exam, diagnosis,  procedures, and orders are all accurate and complete.  Austyn Seier G. [place], MD  [facility]. [place] office.

## 2023-11-16 NOTE — Assessment & Plan Note (Signed)
 Could be contributing to fatigue. Continue current dose of vitamin D supplementation. Further recommendation will be given according to 25 OH vitamin D result.

## 2023-11-16 NOTE — Assessment & Plan Note (Signed)
 Last hemoglobin A1c was 6.2 in 06/2023. Consistency with a healthy lifestyle encouraged for diabetes prevention.

## 2023-11-16 NOTE — Assessment & Plan Note (Signed)
 She has been evaluated by cardiologist in the past for this problem, about 5 years ago, 07/2019. Echo with LVEF 58% and grade I diastolic dysfunction. EKG today with SR, normal axis, unspecific T wave changes that are new when compared with prior EKG's. Reporting new onset palpitations. Instructed about warning signs. Cardiology referral placed.

## 2023-11-16 NOTE — Assessment & Plan Note (Signed)
 Getting worse for the past month or so. A few of her co morbilities and medications could be aggravating problem.  She will decrease Trazodone from 100 mg to 50 mg daily and continue weaning off as tolerated, stop Hydroxyzine and continue as needed for acute anxiety.  Examination today does not suggest a serious process. Further recommendations will be given according to lab results.

## 2023-11-20 ENCOUNTER — Telehealth: Payer: Self-pay | Admitting: *Deleted

## 2023-11-20 NOTE — Telephone Encounter (Signed)
 Copied from CRM [id number]. Topic: Clinical - Lab/Test Results >> Nov 19, 2023  5:01 PM Denese Killings wrote: Reason for CRM: Patient is returning a call in regards to lab results.

## 2023-11-25 NOTE — Telephone Encounter (Signed)
 See result note.

## 2023-11-29 ENCOUNTER — Other Ambulatory Visit: Payer: Self-pay | Admitting: Family Medicine

## 2023-12-23 ENCOUNTER — Emergency Department (HOSPITAL_COMMUNITY)
Admission: EM | Admit: 2023-12-23 | Discharge: 2023-12-23 | Disposition: A | Discharge status: Home / Self Care | Attending: Emergency Medicine | Admitting: Emergency Medicine

## 2023-12-23 ENCOUNTER — Emergency Department (HOSPITAL_COMMUNITY)

## 2023-12-23 ENCOUNTER — Other Ambulatory Visit: Payer: Self-pay

## 2023-12-23 ENCOUNTER — Encounter (HOSPITAL_COMMUNITY): Payer: Self-pay

## 2023-12-23 DIAGNOSIS — R109 Unspecified abdominal pain: Secondary | ICD-10-CM | POA: Diagnosis not present

## 2023-12-23 DIAGNOSIS — K5790 Diverticulosis of intestine, part unspecified, without perforation or abscess without bleeding: Secondary | ICD-10-CM | POA: Diagnosis not present

## 2023-12-23 DIAGNOSIS — R0781 Pleurodynia: Secondary | ICD-10-CM | POA: Diagnosis not present

## 2023-12-23 DIAGNOSIS — K5730 Diverticulosis of large intestine without perforation or abscess without bleeding: Secondary | ICD-10-CM | POA: Diagnosis not present

## 2023-12-23 DIAGNOSIS — S301XXA Contusion of abdominal wall, initial encounter: Secondary | ICD-10-CM | POA: Diagnosis not present

## 2023-12-23 DIAGNOSIS — R079 Chest pain, unspecified: Secondary | ICD-10-CM | POA: Diagnosis not present

## 2023-12-23 DIAGNOSIS — R233 Spontaneous ecchymoses: Secondary | ICD-10-CM | POA: Insufficient documentation

## 2023-12-23 DIAGNOSIS — R0789 Other chest pain: Secondary | ICD-10-CM | POA: Diagnosis not present

## 2023-12-23 DIAGNOSIS — R42 Dizziness and giddiness: Secondary | ICD-10-CM | POA: Diagnosis not present

## 2023-12-23 DIAGNOSIS — K760 Fatty (change of) liver, not elsewhere classified: Secondary | ICD-10-CM | POA: Insufficient documentation

## 2023-12-23 LAB — TROPONIN I (HIGH SENSITIVITY)
Troponin I (High Sensitivity): 4 ng/L
Troponin I (High Sensitivity): 3 ng/L (ref <18)

## 2023-12-23 LAB — BASIC METABOLIC PANEL WITH GFR
Anion gap: 8 (ref 5–15)
BUN: 23 mg/dL — ABNORMAL HIGH (ref 6–20)
CO2: 25 mmol/L (ref 22–32)
Calcium: 9.3 mg/dL (ref 8.9–10.3)
Chloride: 105 mmol/L (ref 98–111)
Creatinine, Ser: 0.78 mg/dL (ref 0.44–1.00)
GFR, Estimated: >60 mL/min
Glucose, Bld: 144 mg/dL — ABNORMAL HIGH (ref 70–99)
Potassium: 3.9 mmol/L (ref 3.5–5.1)
Sodium: 138 mmol/L (ref 135–145)

## 2023-12-23 LAB — CBC
HCT: 45.8 % (ref 36.0–46.0)
Hemoglobin: 15.1 g/dL — ABNORMAL HIGH (ref 12.0–15.0)
MCH: 30.8 pg (ref 26.0–34.0)
MCHC: 33 g/dL (ref 30.0–36.0)
MCV: 93.5 fL (ref 80.0–100.0)
Platelets: 342 10*3/uL (ref 150–400)
RBC: 4.9 MIL/uL (ref 3.87–5.11)
RDW: 12.7 % (ref 11.5–15.5)
WBC: 7.5 10*3/uL (ref 4.0–10.5)
nRBC: 0 % (ref 0.0–0.2)

## 2023-12-23 LAB — D-DIMER, QUANTITATIVE: D-Dimer, Quant: 0.36 ug{FEU}/mL (ref 0.00–0.50)

## 2023-12-23 MED ORDER — HYDROXYZINE HCL 25 MG PO TABS: 25.0000 mg | ORAL_TABLET | Freq: Four times a day (QID) | ORAL | 0 refills | Status: DC

## 2023-12-23 MED ORDER — FENTANYL CITRATE PF 50 MCG/ML IJ SOSY
50.0000 ug | PREFILLED_SYRINGE | Freq: Once | INTRAMUSCULAR | Status: AC
  Administered 2023-12-23: 50 ug via INTRAVENOUS
  Filled 2023-12-23: qty 1

## 2023-12-23 MED ORDER — MORPHINE SULFATE (PF) 4 MG/ML IV SOLN
4.0000 mg | Freq: Once | INTRAVENOUS | Status: AC
  Administered 2023-12-23: 4 mg via INTRAVENOUS
  Filled 2023-12-23: qty 1

## 2023-12-23 MED ORDER — OXYCODONE-ACETAMINOPHEN 5-325 MG PO TABS: 1.0000 | ORAL_TABLET | Freq: Once | ORAL | Status: DC

## 2023-12-23 MED ORDER — IOHEXOL 350 MG/ML SOLN
75.0000 mL | Freq: Once | INTRAVENOUS | Status: AC | PRN
  Administered 2023-12-23: 75 mL via INTRAVENOUS

## 2023-12-23 MED ORDER — KETOROLAC TROMETHAMINE 15 MG/ML IJ SOLN
15.0000 mg | Freq: Once | INTRAMUSCULAR | Status: AC
  Administered 2023-12-23: 15 mg via INTRAVENOUS
  Filled 2023-12-23: qty 1

## 2023-12-23 MED ORDER — HYDROMORPHONE HCL 1 MG/ML IJ SOLN
1.0000 mg | Freq: Once | INTRAMUSCULAR | Status: AC
  Administered 2023-12-23: 1 mg via INTRAVENOUS
  Filled 2023-12-23: qty 1

## 2023-12-23 NOTE — ED Triage Notes (Signed)
 Pt c/o pain in left sided rib area that radiates into left mid backx3d. Pt c/o SOB w/inspiration started yesterday. Pt c/o rash on left side of backx3d. Pt states its' itching. The rash has tiny bumps. Pt able to apeak complete sentences

## 2023-12-23 NOTE — ED Provider Notes (Signed)
 [place] EMERGENCY DEPARTMENT AT [facility] Provider Note   CSN: [id number] Arrival date & time: 12/23/23  1112     History  No chief complaint on file.   [name] is a 58 y.o. female, hx of fibromyalgia, chronic pain, who presents to the ED 2/2 to left rib cage pain, and rash, this been going on for the last 4 days.  She is for the last 4 days, she has had severe left-sided rib cage pain, or/flank pain, has been going on she states that sharp and stabbing, and worse with a deep inspiration.  She reports that she has been itching the area, and has now developed a rash, on her left side now.  Notes that she is not have any nausea, vomiting, but has been using her buprenorphine, patches, and meloxicam without relief.  States the pain is a 10 out of 10 especially when she takes a deep breath.  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 Denies any recent injuries, surgeries, or use of hormonal products.  No history of DVT or PE    Home Medications Prior to Admission medications   Medication Sig Start Date End Date Taking? Authorizing Provider  hydrOXYzine (ATARAX) 25 MG tablet Take 1 tablet (25 mg total) by mouth every 6 (six) hours. 12/23/23  Yes Talis Iwan L, PA  acetaminophen (TYLENOL) 500 MG tablet Take 1,000 mg by mouth every 6 (six) hours as needed for mild pain or moderate pain.    [provider]  antiseptic oral rinse (BIOTENE) LIQD 15 mLs by Mouth Rinse route as needed for dry mouth.    [provider]  Benzocaine (KANK-A MT) Use as directed 1 Application in the mouth or throat as needed (oral sores).    [provider]  benzonatate (TESSALON) 100 MG capsule Take 1 capsule (100 mg total) by mouth every 8 (eight) hours. 07/09/23   Curatolo, Adam, DO  buprenorphine (BUTRANS) 15 MCG/HR Place 1 patch onto the skin once a week. 06/21/21   [provider]  DULoxetine (CYMBALTA) 60 MG capsule Take 60 mg by mouth every evening.    [provider]  fluticasone (FLONASE) 50 MCG/ACT nasal spray SPRAY 2 SPRAYS INTO EACH NOSTRIL EVERY DAY 11/29/23   [place], Betty G, MD  gabapentin (NEURONTIN) 600 MG tablet Take 1,200 mg by mouth 3 (three) times daily. 01/23/19   [provider]  meloxicam (MOBIC) 15 MG tablet Take 15 mg by mouth in the morning. 02/16/21   [provider]  methocarbamol (ROBAXIN) 500 MG tablet Take 500 mg by mouth in the morning and at bedtime. 07/10/22   [provider]  Multiple Vitamin (MULTIVITAMIN WITH MINERALS) TABS tablet Take 1 tablet by mouth daily. One A Day Multivitamin    [provider]  tobramycin-dexamethasone (TOBRADEX) ophthalmic solution Apply to eye. 02/08/23   [provider]  traZODone (DESYREL) 100 MG tablet Take 100 mg by mouth at bedtime. 04/04/21   [provider]      Allergies    Pneumovax 23 [pneumococcal vac polyvalent] and Seroquel [quetiapine fumarate]    Review of Systems   Review of Systems  Cardiovascular:  Positive for chest pain. Negative for leg swelling.    Physical Exam Updated Vital Signs BP (!) 121/91   Pulse 70   Temp 97.9 F (36.6 C)   Resp 18   Ht 5'  4" (1.626 m)   Wt 107.2 kg   LMP 07/17/2014 Comment: Pt stated that she is going through menopause and that she will go for 6-8 months without a period and then have it twice a month.   SpO2 93%   BMI 40.57 kg/m  Physical Exam Vitals and nursing note reviewed.  Constitutional:      General: She is not in acute distress.    Appearance: She is well-developed.  HENT:     Head: Normocephalic and atraumatic.  Eyes:     Conjunctiva/sclera: Conjunctivae normal.  Cardiovascular:     Rate and Rhythm: Normal rate and regular rhythm.     Heart sounds: No murmur heard. Pulmonary:     Effort: Pulmonary effort is normal. No respiratory distress.     Breath sounds:  Normal breath sounds.  Abdominal:     Palpations: Abdomen is soft.     Tenderness: There is no abdominal tenderness.  Musculoskeletal:        General: No swelling.     Cervical back: Neck supple.     Comments: Tenderness to palpation, of left rib cage, along posterior rib cage, lateral, and anterior.    Skin:    General: Skin is warm and dry.     Capillary Refill: Capillary refill takes less than 2 seconds.     Comments: [name] petechiae, inferior to rib cage along the left lateral border  Neurological:     Mental Status: She is alert.  Psychiatric:        Mood and Affect: Mood normal.     ED Results / Procedures / Treatments   Labs (all labs ordered are listed, but only abnormal results are displayed) Labs Reviewed  BASIC METABOLIC PANEL - Abnormal; Notable for the following components:      Result Value   Glucose, Bld 144 (*)    BUN 23 (*)    All other components within normal limits  CBC - Abnormal; Notable for the following components:   Hemoglobin 15.1 (*)    All other components within normal limits  D-DIMER, QUANTITATIVE  TROPONIN I (HIGH SENSITIVITY)  TROPONIN I (HIGH SENSITIVITY)    EKG None  Radiology CT CHEST ABDOMEN PELVIS W CONTRAST Result Date: 12/23/2023 CLINICAL DATA:  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 RADIATION DOSE REDUCTION: This exam was performed according to the departmental dose-optimization program which includes automated exposure control, adjustment of the mA and/or kV according to patient size and/or use of iterative reconstruction technique. CONTRAST:  75mL OMNIPAQUE IOHEXOL 350 MG/ML SOLN COMPARISON:  01/02/2023 FINDINGS: CT CHEST FINDINGS  Cardiovascular: Thoracic aorta is within normal limits. No cardiac enlargement is seen. The pulmonary artery as visualized is within normal limits although not timed for embolus evaluation. Mediastinum/Nodes: Thoracic inlet is within normal limits. No hilar or mediastinal adenopathy is noted. The esophagus as visualized is within normal limits. Lungs/Pleura: Lungs are well aerated bilaterally. No focal infiltrate or sizable effusion is seen. Musculoskeletal: No chest wall mass or suspicious bone lesions identified. CT ABDOMEN PELVIS FINDINGS Hepatobiliary: Mild fatty infiltration of the liver is noted. The gallbladder has been surgically removed. Common bile duct is prominent consistent with the post cholecystectomy state. Pancreas: Unremarkable. No pancreatic ductal dilatation or surrounding inflammatory changes. Spleen: Normal  in size without focal abnormality. Adrenals/Urinary Tract: Adrenal glands are within normal limits. Kidneys demonstrate a normal enhancement pattern bilaterally. No renal calculi or obstructive changes are seen. The bladder is well distended. Stomach/Bowel: Diverticular change of the colon is noted with mild smooth muscle hypertrophy in the sigmoid. No evidence of diverticulitis is seen. The appendix has been surgically removed. Tilden Broz bowel and stomach are within normal limits. Vascular/Lymphatic: No significant vascular findings are present. No enlarged abdominal or pelvic lymph nodes. Reproductive: Uterus and bilateral adnexa are unremarkable. Other: No abdominal wall hernia or abnormality. No abdominopelvic ascites. Musculoskeletal: Postsurgical changes are noted at L5-S1. Mild degenerative anterolisthesis of L4 on L5 is seen. IMPRESSION: CT of the chest: No acute abnormality noted. CT of the abdomen and pelvis: Diverticulosis without diverticulitis. Fatty liver. Electronically Signed   By: Alcide Clever M.D.   On: 12/23/2023 20:05   DG Chest 2 View Result Date: 12/23/2023 CLINICAL  DATA:  Chest pain and dizziness. EXAM: CHEST - 2 VIEW COMPARISON:  Chest radiograph dated 02/06/2021 FINDINGS: No focal consolidation, pleural effusion, pneumothorax. The cardiac silhouette is within normal limits. No acute osseous pathology. IMPRESSION: No active cardiopulmonary disease. Electronically Signed   By: Elgie Collard M.D.   On: 12/23/2023 13:14    Procedures Procedures    Medications Ordered in ED Medications  ketorolac (TORADOL) 15 MG/ML injection 15 mg (15 mg Intravenous Given 12/23/23 1320)  morphine (PF) 4 MG/ML injection 4 mg (4 mg Intravenous Given 12/23/23 1431)  fentaNYL (SUBLIMAZE) injection 50 mcg (50 mcg Intravenous Given 12/23/23 1758)  iohexol (OMNIPAQUE) 350 MG/ML injection 75 mL (75 mLs Intravenous Contrast Given 12/23/23 1746)  HYDROmorphone (DILAUDID) injection 1 mg (1 mg Intravenous Given 12/23/23 2047)    ED Course/ Medical Decision Making/ A&P                                 Medical Decision Making Patient is a 58 year old female, here for left-sided flank/chest pain been going on for the last 4 days.  Is pleuritic.  Denies any recent injuries, or history of DVT or PE.  Given pleuritic chest pain, obesity, we will obtain a D-dimer.  Will also obtain chest x-ray, troponin for further evaluation.  She does have a petechiae, on her left rib cage, will need CT scan.  Amount and/or Complexity of Data Reviewed Labs: ordered.    Details: Blood work unremarkable Radiology: ordered.    Details: CT of chest, abdomen, pelvis unremarkable Discussion of management or test interpretation with external provider(s): Patient is a 58 year old female, here for chest pain, has been going on for the last 4 days.  She has associated shortness of breath with it, D-dimer is negative, less than 0.5, troponins negative.  CTA chest abdomen pelvis obtained given petechiae, history of multiple lipomas given genetic condition.  No acute findings on CT abdomen pelvis.  I believe that the  petechiae are likely secondary to from her itching from the low-grade pain.  I discussed following up with her pain doctor, for further pain management, and her primary care doctor.  The pain may be from lipomas not visualized on the CT.  Unclear at this time.  Discharged home with strict return precautions  Risk Prescription drug management.    Final Clinical Impression(s) / ED Diagnoses Final diagnoses:  Chest wall pain  Petechiae    Rx / DC Orders ED Discharge Orders  Ordered    hydrOXYzine (ATARAX) 25 MG tablet  Every 6 hours        12/23/23 2054              Pete Pelt, [place] 12/23/23 2112    Benjiman Core, MD 12/24/23 [phone]

## 2023-12-23 NOTE — Discharge Instructions (Addendum)
 Your heart enzymes, were within normal limits, there is no evidence, for a blood clot.  Your CT scan was reassuring.  I believe that your pain is likely secondary from your lower lipomas, I recommend following up with general surgery, and your primary care doctor.  I also recommend following up with your pain doctor, so you can have your dose increased.  The marks on that your back, are likely secondary to you rubbing this area from the pain.  You can use hydroxyzine, to help with itching, but I think this is likely related to low-grade pain.

## 2023-12-27 DIAGNOSIS — G894 Chronic pain syndrome: Secondary | ICD-10-CM | POA: Diagnosis not present

## 2023-12-27 DIAGNOSIS — M797 Fibromyalgia: Secondary | ICD-10-CM | POA: Diagnosis not present

## 2023-12-27 DIAGNOSIS — E882 Lipomatosis, not elsewhere classified: Secondary | ICD-10-CM | POA: Diagnosis not present

## 2023-12-27 DIAGNOSIS — Z79899 Other long term (current) drug therapy: Secondary | ICD-10-CM | POA: Diagnosis not present

## 2024-01-03 ENCOUNTER — Encounter: Payer: Self-pay | Admitting: Family Medicine

## 2024-01-03 ENCOUNTER — Ambulatory Visit: Admitting: Family Medicine

## 2024-01-03 VITALS — BP 130/80 | HR 100 | Temp 98.7°F | Resp 16 | Ht 64.0 in | Wt 228.6 lb

## 2024-01-03 DIAGNOSIS — B0229 Other postherpetic nervous system involvement: Secondary | ICD-10-CM

## 2024-01-03 DIAGNOSIS — R7303 Prediabetes: Secondary | ICD-10-CM | POA: Diagnosis not present

## 2024-01-03 DIAGNOSIS — E559 Vitamin D deficiency, unspecified: Secondary | ICD-10-CM

## 2024-01-03 MED ORDER — LIDOCAINE 5 % EX PTCH: 1.0000 | MEDICATED_PATCH | CUTANEOUS | 1 refills | Status: AC

## 2024-01-03 NOTE — Assessment & Plan Note (Signed)
 Continue vit D 2000 U daily. Will add 25 OH vit D to next blood work.

## 2024-01-03 NOTE — Progress Notes (Signed)
 ACUTE VISIT Chief Complaint  Patient presents with   Herpes Zoster    suspected   HPI: [name] is a 58 y.o. female with a PMHx significant for chronic pain syndrome, prediabetes, vitamin D deficiency, anxiety, depression, and fibromyalgia, who is here today complaining of tender rash, which she thinks may be shingles.  She complains of a rash on her back she has had since 3/22.Initially rash was mildly pruritic but then became tender with light touch. She was seen in the ED on 3/24 for this problem. She underwent extensive work up, which included imaging due to chest wall pain, did not receive rx to treat rash. Since then, the rash has continued and pain is getting worse.  Pain is described as burning sensation, and she has also had intermittent chills.  She has tried using castor oil on the area and also taking tylenol.   She is on gabapentin 1200 mg three times daily for fibromyalgia.  Denies fever, sore throat, or oral lesions..   CT of the chest: No acute abnormality noted.  CT of the abdomen and pelvis: Diverticulosis without diverticulitis.  Fatty liver  Lab Results  Component Value Date   NA 138 12/23/2023   CL 105 12/23/2023   K 3.9 12/23/2023   CO2 25 12/23/2023   BUN 23 (H) 12/23/2023   CREATININE 0.78 12/23/2023   GFRNONAA >60 12/23/2023   CALCIUM 9.3 12/23/2023   ALBUMIN 4.2 11/15/2023   GLUCOSE 144 (H) 12/23/2023   Lab Results  Component Value Date   WBC 7.5 12/23/2023   HGB 15.1 (H) 12/23/2023   HCT 45.8 12/23/2023   MCV 93.5 12/23/2023   PLT 342 12/23/2023   Prediabetes: Negative for polydipsia,polyuria, or polyphagia.  Lab Results  Component Value Date   HGBA1C 6.5 11/15/2023   She has not been exercising lately due to her pain.   Vit D deficiency: She is on Vit D 2000 U daily.  Lab Results  Component Value Date   VD25OH 27.36 (L) 11/15/2023   Review of Systems  Constitutional:  Positive for fatigue. Negative for activity  change and appetite change.  Respiratory:  Negative for cough, shortness of breath and wheezing.   Cardiovascular:  Negative for chest pain and leg swelling.  Gastrointestinal:  Negative for abdominal pain, nausea and vomiting.  Genitourinary:  Negative for decreased urine volume, dysuria and hematuria.  Musculoskeletal:  Positive for arthralgias, back pain and myalgias.  Skin:  Negative for wound.  Neurological:  Negative for syncope, facial asymmetry and weakness.  Psychiatric/Behavioral:  Negative for confusion and hallucinations.   See other pertinent positives and negatives in HPI.  Current Outpatient Medications on File Prior to Visit  Medication Sig Dispense Refill   acetaminophen (TYLENOL) 500 MG tablet Take 1,000 mg by mouth every 6 (six) hours as needed for mild pain or moderate pain.     antiseptic oral rinse (BIOTENE) LIQD 15 mLs by Mouth Rinse route as needed for dry mouth.     Benzocaine (KANK-A MT) Use as directed 1 Application in the mouth or throat as needed (oral sores).     benzonatate (TESSALON) 100 MG capsule Take 1 capsule (100 mg total) by mouth every 8 (eight) hours. 21 capsule 0   buprenorphine (BUTRANS) 15 MCG/HR Place 1 patch onto the skin once a week.     DULoxetine (CYMBALTA) 60 MG capsule Take 90 mg by mouth every evening.     fluticasone (FLONASE) 50 MCG/ACT nasal spray SPRAY 2  SPRAYS INTO EACH NOSTRIL EVERY DAY 48 mL 1   gabapentin (NEURONTIN) 600 MG tablet Take 1,200 mg by mouth 3 (three) times daily.     hydrOXYzine (ATARAX) 25 MG tablet Take 1 tablet (25 mg total) by mouth every 6 (six) hours. 12 tablet 0   meloxicam (MOBIC) 15 MG tablet Take 15 mg by mouth in the morning.     methocarbamol (ROBAXIN) 500 MG tablet Take 500 mg by mouth in the morning and at bedtime.     Multiple Vitamin (MULTIVITAMIN WITH MINERALS) TABS tablet Take 1 tablet by mouth daily. One A Day Multivitamin     tobramycin-dexamethasone (TOBRADEX) ophthalmic solution Apply to eye.      traZODone (DESYREL) 100 MG tablet Take 100 mg by mouth at bedtime. (Patient not taking: Reported on 01/03/2024)     No current facility-administered medications on file prior to visit.    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   Allergies  Allergen Reactions   Pneumovax 23 [Pneumococcal Vac Polyvalent] Other (See Comments)    Arm mass   Seroquel [Quetiapine Fumarate] Nausea Only    Nausea, headache and lethargy    Social History   Socioeconomic History   Marital status: Divorced    Spouse name: Not on file   Number of children: 3   Years of education: Not on file   Highest education level: Not on file  Occupational History   Not on file  Tobacco Use   Smoking status: Former    Current packs/day: 0.00    Average packs/day: 1 pack/day for 43.2 years (43.2 ttl pk-yrs)    Types: Cigarettes    Start date: 85    Quit date: 12/01/2022    Years since quitting: 1.0    Passive exposure: Current   Smokeless tobacco: Never  Vaping Use   Vaping status: Never Used  Substance and Sexual Activity   Alcohol use: No   Drug use: No   Sexual activity: Not Currently  Other Topics Concern   Not on file  Social History Narrative   Not on file   Social Drivers of Health   Financial Resource Strain: Low Risk  (02/12/2022)   Overall Financial Resource Strain (CARDIA)    Difficulty of Paying Living Expenses: Not hard at all  Food Insecurity: No Food Insecurity (02/14/2023)   Hunger Vital Sign    Worried About Running Out of Food in the Last Year: Never true    Ran Out of Food in the Last  Year: Never true  Transportation Needs: No Transportation Needs (02/14/2023)   PRAPARE - Administrator, Civil Service (Medical): No    Lack of Transportation (Non-Medical): No  Physical Activity: Unknown (02/14/2023)   Exercise Vital Sign    Days of Exercise per Week: 0 days    Minutes of Exercise per Session: Patient unable to answer  Stress: Stress Concern Present (02/14/2023)   [employer] of Occupational Health -  Occupational Stress Questionnaire    Feeling of Stress : Very much  Social Connections: Unknown (02/14/2023)   Social Connection and Isolation Panel [NHANES]    Frequency of Communication with Friends and Family: Twice a week    Frequency of Social Gatherings with Friends and Family: Patient unable to answer    Attends Religious Services: Patient unable to answer    Active Member of Clubs or Organizations: Yes    Attends Banker Meetings: Patient unable to answer    Marital Status: Divorced   Vitals:   01/03/24 1458  BP: 130/80  Pulse: 100  Resp: 16  Temp: 98.7 F (37.1 C)  SpO2: 98%   Wt Readings from Last 3 Encounters:  01/03/24 228 lb 9.6 oz (103.7 kg)  12/23/23 236 lb 5.3 oz (107.2 kg)  11/15/23 236 lb 4 oz (107.2 kg)   Body mass index is 39.24 kg/m.  Physical Exam Vitals and nursing note reviewed.  Constitutional:      General: She is not in acute distress.    Appearance: She is well-developed.  HENT:     Head: Normocephalic and atraumatic.     Mouth/Throat:     Mouth: Mucous membranes are moist.  Eyes:     Conjunctiva/sclera: Conjunctivae normal.  Cardiovascular:     Rate and Rhythm: Normal rate and regular rhythm.     Heart sounds: No murmur heard. Pulmonary:     Effort: Pulmonary effort is normal. No respiratory distress.     Breath sounds: Normal breath sounds.  Skin:    General: Skin is warm.     Findings: Rash present. No erythema.       Neurological:     General: No focal deficit present.     Mental  Status: She is alert and oriented to person, place, and time.     Gait: Gait normal.  Psychiatric:        Mood and Affect: Affect normal. Mood is anxious.    ASSESSMENT AND PLAN:  Ms. Grajeda was seen today for possible shingles.   Postherpetic neuralgia Rash is improving, at this time she is not going to benefit from antiviral therapy. She is already on Gabapentin and takes Buprenorphine for pain management. Recommend Lidocaine patch, Lidoderm. Monitor for changes.  -     Lidocaine; Place 1 patch onto the skin daily. Remove & Discard patch within 12 hours or as directed by MD  Dispense: 30 patch; Refill: 1  Prediabetes Assessment & Plan: Last HgA1C 6.5 on 11/15/23. We discussed Dx criteria for DM II. Consistency with a healthy life style encouraged.  HgA1C in 4-5 weeks.  Orders: -     Hemoglobin A1c; Future  Vitamin D deficiency, unspecified Assessment & Plan: Continue vit D 2000 U daily. Will add 25 OH vit D to next blood work.  Orders: -     VITAMIN D 25 Hydroxy (Vit-D Deficiency, Fractures); Future  Morbid obesity with BMI 39 and OA,HLD,depression (HCC) Assessment & Plan: Since 11/2023 she has lost about 8 Lb. She understands the benefits of wt loss as well as adverse effects of obesity. Consistency with healthy diet and low impact physical activity encouraged. Due to chronic pain, it is difficult for her to exercise, even low impact physical activity aggravates pain.  Return if symptoms worsen or fail to improve, for keep next appointment.  I, Suanne Marker, acting as a scribe for Jamareon Shimel [place], MD., have documented all relevant documentation on the behalf of Kimori Tartaglia [place], MD,  as directed by  Eula Jaster [place], MD while in the presence of Shauntee Karp [place], MD.   I, Talibah Colasurdo [place], MD, have reviewed all documentation for this visit. The documentation on 01/03/24 for the exam, diagnosis, procedures, and orders are all accurate and complete.  Monya Kozakiewicz G. [place], MD  [facility]. [place] office.

## 2024-01-03 NOTE — Patient Instructions (Addendum)
 A few things to remember from today's visit:  Postherpetic neuralgia  Prediabetes - Plan: Hemoglobin A1c  Vitamin D deficiency, unspecified - Plan: VITAMIN D 25 Hydroxy (Vit-D Deficiency, Fractures) Lidocaine patch daily. Labs in 4-5 weeks.  If you need refills for medications you take chronically, please call your pharmacy. Do not use My Chart to request refills or for acute issues that need immediate attention. If you send a my chart message, it may take a few days to be addressed, specially if I am not in the office.  Please be sure medication list is accurate. If a new problem present, please set up appointment sooner than planned today.

## 2024-01-03 NOTE — Assessment & Plan Note (Signed)
 Last HgA1C 6.5 on 11/15/23. We discussed Dx criteria for DM II. Consistency with a healthy life style encouraged.  HgA1C in 4-5 weeks.

## 2024-01-03 NOTE — Assessment & Plan Note (Addendum)
 Since 11/2023 she has lost about 8 Lb. She understands the benefits of wt loss as well as adverse effects of obesity. Consistency with healthy diet and low impact physical activity encouraged. Due to chronic pain, it is difficult for her to exercise, even low impact physical activity aggravates pain.

## 2024-01-08 ENCOUNTER — Telehealth: Payer: Self-pay | Admitting: Pharmacy Technician

## 2024-01-08 ENCOUNTER — Other Ambulatory Visit (HOSPITAL_COMMUNITY): Payer: Self-pay

## 2024-01-08 NOTE — Telephone Encounter (Signed)
 Pharmacy Patient Advocate Encounter   Received notification from CoverMyMeds that prior authorization for Lidocaine 5% patches is required/requested.   Insurance verification completed.   The patient is insured through [facility] .   Per test claim: Refill too soon. PA is not needed at this time. Medication was filled 01/03/2024. Next eligible fill date is 01/26/2024.  Tried to submit via cover my meds KEY#[id number] see below.

## 2024-01-14 ENCOUNTER — Other Ambulatory Visit: Payer: Self-pay | Admitting: *Deleted

## 2024-01-14 DIAGNOSIS — Z122 Encounter for screening for malignant neoplasm of respiratory organs: Secondary | ICD-10-CM

## 2024-01-14 DIAGNOSIS — Z87891 Personal history of nicotine dependence: Secondary | ICD-10-CM

## 2024-02-07 ENCOUNTER — Other Ambulatory Visit

## 2024-02-07 DIAGNOSIS — R7303 Prediabetes: Secondary | ICD-10-CM | POA: Diagnosis not present

## 2024-02-07 DIAGNOSIS — E559 Vitamin D deficiency, unspecified: Secondary | ICD-10-CM

## 2024-02-08 LAB — HEMOGLOBIN A1C
Est. average glucose Bld gHb Est-mCnc: 134 mg/dL
Hgb A1c MFr Bld: 6.3 % — ABNORMAL HIGH (ref 4.8–5.6)

## 2024-02-08 LAB — VITAMIN D 25 HYDROXY (VIT D DEFICIENCY, FRACTURES): Vit D, 25-Hydroxy: 26.4 ng/mL — ABNORMAL LOW (ref 30.0–100.0)

## 2024-02-11 ENCOUNTER — Ambulatory Visit: Payer: Self-pay | Admitting: Family Medicine

## 2024-03-09 ENCOUNTER — Telehealth: Payer: Self-pay

## 2024-03-09 DIAGNOSIS — R0683 Snoring: Secondary | ICD-10-CM

## 2024-03-09 NOTE — Addendum Note (Signed)
 Addended by: Doll French on: 03/09/2024 02:13 PM   Modules accepted: Orders

## 2024-03-09 NOTE — Telephone Encounter (Signed)
 I called and spoke with patient. She was supposed to have a sleep study done, but was unable to at that time. New referral placed & pt is aware they will call her to schedule.

## 2024-03-09 NOTE — Telephone Encounter (Signed)
 Copied from CRM [id number]. Topic: Referral - Question >> Mar 09, 2024 11:38 AM Keitha Pata L wrote: Reason for CRM: patient is calling in for a referral for a sleep study exam and would like a call back

## 2024-04-01 DIAGNOSIS — E882 Lipomatosis, not elsewhere classified: Secondary | ICD-10-CM | POA: Diagnosis not present

## 2024-04-01 DIAGNOSIS — Z79899 Other long term (current) drug therapy: Secondary | ICD-10-CM | POA: Diagnosis not present

## 2024-04-01 DIAGNOSIS — G894 Chronic pain syndrome: Secondary | ICD-10-CM | POA: Diagnosis not present

## 2024-04-01 DIAGNOSIS — M797 Fibromyalgia: Secondary | ICD-10-CM | POA: Diagnosis not present

## 2024-04-13 DIAGNOSIS — R0602 Shortness of breath: Secondary | ICD-10-CM | POA: Diagnosis not present

## 2024-04-13 DIAGNOSIS — E7800 Pure hypercholesterolemia, unspecified: Secondary | ICD-10-CM | POA: Diagnosis not present

## 2024-04-13 DIAGNOSIS — I499 Cardiac arrhythmia, unspecified: Secondary | ICD-10-CM | POA: Diagnosis not present

## 2024-04-13 DIAGNOSIS — Z136 Encounter for screening for cardiovascular disorders: Secondary | ICD-10-CM | POA: Diagnosis not present

## 2024-04-13 DIAGNOSIS — R9431 Abnormal electrocardiogram [ECG] [EKG]: Secondary | ICD-10-CM | POA: Diagnosis not present

## 2024-04-30 DIAGNOSIS — Z1231 Encounter for screening mammogram for malignant neoplasm of breast: Secondary | ICD-10-CM | POA: Diagnosis not present

## 2024-04-30 LAB — HM MAMMOGRAPHY

## 2024-05-28 DIAGNOSIS — M25512 Pain in left shoulder: Secondary | ICD-10-CM | POA: Diagnosis not present

## 2024-05-28 DIAGNOSIS — M7541 Impingement syndrome of right shoulder: Secondary | ICD-10-CM | POA: Diagnosis not present

## 2024-05-28 DIAGNOSIS — M7062 Trochanteric bursitis, left hip: Secondary | ICD-10-CM | POA: Diagnosis not present

## 2024-06-08 ENCOUNTER — Other Ambulatory Visit: Payer: Self-pay | Admitting: Family Medicine

## 2024-06-08 ENCOUNTER — Telehealth: Payer: Self-pay | Admitting: *Deleted

## 2024-06-08 DIAGNOSIS — R7303 Prediabetes: Secondary | ICD-10-CM

## 2024-06-08 NOTE — Telephone Encounter (Signed)
 Copied from CRM [id number]. Topic: Clinical - Lab/Test Results >> Jun 08, 2024 12:32 PM Amy B wrote: Reason for CRM: Patient requests lab work to check A1C, test for Erlos Danlos syndrome

## 2024-06-08 NOTE — Telephone Encounter (Unsigned)
 Copied from CRM [id number]. Topic: Clinical - Medication Refill >> Jun 08, 2024 12:30 PM Amy B wrote: Medication: FLU shot and COVID vaccine  Has the patient contacted their pharmacy? No (Agent: If no, request that the patient contact the pharmacy for the refill. If patient does not wish to contact the pharmacy document the reason why and proceed with request.) (Agent: If yes, when and what did the pharmacy advise?)  This is the patient's preferred pharmacy:  [id number] [address] [place], [place] - [address] [place] [address] Phone: [phone] Fax: [phone]  Is this the correct pharmacy for this prescription? Yes If no, delete pharmacy and type the correct one.   Has the prescription been filled recently? No  Is the patient out of the medication? Yes  Has the patient been seen for an appointment in the last year OR does the patient have an upcoming appointment? Yes  Can we respond through MyChart? No  Agent: Please be advised that Rx refills may take up to 3 business days. We ask that you follow-up with your pharmacy.

## 2024-06-09 ENCOUNTER — Telehealth: Payer: Self-pay | Admitting: *Deleted

## 2024-06-09 NOTE — Telephone Encounter (Signed)
 Copied from CRM [id number]. Topic: Clinical - Lab/Test Results >> Jun 08, 2024 12:32 PM Amy B wrote: Reason for CRM: Patient requests lab work to check A1C, test for Erlos Danlos syndrome >> Jun 09, 2024 12:27 PM Thersia C wrote: Patient calling in to following about the lab results and vaccines would like a callback today regarding this

## 2024-06-09 NOTE — Telephone Encounter (Signed)
Already addressed. BJ

## 2024-06-09 NOTE — Telephone Encounter (Signed)
 Last hemoglobin A1c done in May/2025, it is okay to place order for test. In regard to Ehlers-Danlos syndrome screening, we can discuss this in the office. Thanks, BJ

## 2024-06-15 ENCOUNTER — Ambulatory Visit: Admitting: Family Medicine

## 2024-06-16 ENCOUNTER — Encounter: Payer: Self-pay | Admitting: Family Medicine

## 2024-06-17 ENCOUNTER — Other Ambulatory Visit: Payer: Self-pay | Admitting: Family Medicine

## 2024-06-17 NOTE — Telephone Encounter (Signed)
 Copied from CRM #[id number]. Topic: Clinical - Medication Refill >> Jun 17, 2024  1:15 PM Burnard DEL wrote: Medication:  hydrOXYzine  (ATARAX ) 25 MG tablet    Has the patient contacted their pharmacy? Yes (Agent: If no, request that the patient contact the pharmacy for the refill. If patient does not wish to contact the pharmacy document the reason why and proceed with request.) (Agent: If yes, when and what did the pharmacy advise?)  This is the patient's preferred pharmacy:  [id number] [place], [place] - [address] [place] [address] Phone: [phone] Fax: [phone]  Is this the correct pharmacy for this prescription? Yes If no, delete pharmacy and type the correct one.   Has the prescription been filled recently? No  Is the patient out of the medication? Yes  Has the patient been seen for an appointment in the last year OR does the patient have an upcoming appointment? Yes  Can we respond through MyChart? No  Agent: Please be advised that Rx refills may take up to 3 business days. We ask that you follow-up with your pharmacy.

## 2024-06-24 ENCOUNTER — Telehealth: Payer: Self-pay | Admitting: *Deleted

## 2024-06-24 NOTE — Telephone Encounter (Signed)
 Ok to refill? Last prescribed by Small, Lyle CROME, PA

## 2024-06-24 NOTE — Telephone Encounter (Signed)
 Copied from CRM #[id number]. Topic: Clinical - Prescription Issue >> Jun 24, 2024 12:20 PM Chiquita SQUIBB wrote: Reason for CRM: Patient is calling in regarding her hydrOXYzine  (ATARAX ) 25 MG tablet. Patient has not heard anything regarding the medication and she is completely out of the medication now. The refill was submitted on 09/17 and is showing refused, patient was not aware it was refused, as she had an appointment on 04/04 and the next one is on 09/26. Patient is upset this has not been done or she has not been called. Please advise patient asap as she would like this today.

## 2024-06-25 ENCOUNTER — Telehealth: Payer: Self-pay | Admitting: *Deleted

## 2024-06-25 NOTE — Telephone Encounter (Signed)
 Copied from CRM [id number]. Topic: Clinical - Prescription Issue >> Jun 25, 2024 10:45 AM Tinnie BROCKS wrote: Reason for CRM: Pt calling regarding refill of hydrOXYzine  (ATARAX ) 25 MG tablet she has been attempting to get since 9/17. She says she has been without the medication and suffers from insomnia. 9/17 refill request says refill not appropriate. She also usually gets 3 mo supply, but these requests are only for a 3 day supply. She has been seen this year and has an appt tomorrow. She is upset and requesting call back to at least let her know why meds are being refused when she has been out.

## 2024-06-25 NOTE — Telephone Encounter (Signed)
 Pt called in frustrated stating that it took 8 days for her to speak with someone concerning her med refill.   Apologized to pt and let her know that Dr. Swaziland is transitioning with getting a new nurse so she is a bit behind with getting things filled; I also stated that this med was originally filled by another Dr so that is also a hiccup.   Pt understood but stated that pts shouldn't be left in the dark concerning things such as medication. I apologized again and pt stated that she just wanted me to be in the know that our office messed up and stuff like this makes pts want to find a new provider.   FYI.

## 2024-06-26 ENCOUNTER — Ambulatory Visit (INDEPENDENT_AMBULATORY_CARE_PROVIDER_SITE_OTHER): Admitting: Family Medicine

## 2024-06-26 ENCOUNTER — Encounter: Payer: Self-pay | Admitting: Family Medicine

## 2024-06-26 VITALS — BP 128/80 | HR 70 | Temp 97.9°F | Resp 16 | Ht 64.0 in | Wt 237.8 lb

## 2024-06-26 DIAGNOSIS — R7303 Prediabetes: Secondary | ICD-10-CM

## 2024-06-26 DIAGNOSIS — E1169 Type 2 diabetes mellitus with other specified complication: Secondary | ICD-10-CM

## 2024-06-26 DIAGNOSIS — F5105 Insomnia due to other mental disorder: Secondary | ICD-10-CM | POA: Diagnosis not present

## 2024-06-26 DIAGNOSIS — F99 Mental disorder, not otherwise specified: Secondary | ICD-10-CM

## 2024-06-26 DIAGNOSIS — M797 Fibromyalgia: Secondary | ICD-10-CM | POA: Diagnosis not present

## 2024-06-26 DIAGNOSIS — Z6841 Body Mass Index (BMI) 40.0 and over, adult: Secondary | ICD-10-CM

## 2024-06-26 DIAGNOSIS — E559 Vitamin D deficiency, unspecified: Secondary | ICD-10-CM | POA: Diagnosis not present

## 2024-06-26 DIAGNOSIS — R233 Spontaneous ecchymoses: Secondary | ICD-10-CM

## 2024-06-26 MED ORDER — HYDROXYZINE HCL 25 MG PO TABS: 25.0000 mg | ORAL_TABLET | Freq: Four times a day (QID) | ORAL | 6 refills | Status: DC

## 2024-06-26 NOTE — Telephone Encounter (Signed)
 Spoke with patient regarding medication and Dr. Gib response. Patient was unhappy they she has called office 4 times and it took 9 days to receive a response regarding her medication. Apologized to patient. Advised patient the transition of not having a CMA permanently right now for the provider, the medication has never been prescribed by Dr. Swaziland, and we were awaiting a response. Patient has appointment scheduled for today and she will talk with Dr. Swaziland about the medication.

## 2024-06-26 NOTE — Telephone Encounter (Signed)
 According to records/med list, this medication has not been prescribed here in the office. It may be from behavioral health provider. We could discuss this during next OV. Thanks, BJ

## 2024-06-26 NOTE — Progress Notes (Signed)
 ACUTE VISIT Chief Complaint  Patient presents with   Medical Management of Chronic Issues    Follow-up and DMV paperwork    Discussed the use of AI scribe software for clinical note transcription with the patient, who gave verbal consent to proceed.  History of Present Illness [name] is a 58 year old female with fibromyalgia, Dercum's disease, and spinal arthritis who presents for a medication refill and evaluation of her chronic pain management.  -She experiences chronic pain due to fibromyalgia, Dercum's disease, and spinal arthritis. She has musculoskeletal pain and multiple lipomas.  Recently called requesting genetic testing for Mertha Danlos syndrome, she wonders if this could be the cause of generalized musculoskeletal pain. She describes being 'double jointed' and bruises easily, often without recollection of trauma and since childhood. States that she has not had a full hug in years due to pain sensitivity. Requesting handicap sticker completed. She follows with pain management. Independent ADL's.  She follows with cardiologist, echo in 07/2021:Left ventricle cavity is normal in size. Mild concentric hypertrophy of  the left ventricle. Normal LV systolic function with EF 58%. Normal global wall motion. Doppler evidence of grade I (impaired) diastolic dysfunction. No significant valvular abnormality.  Small circumferential pericardial effusion with no hemodynamic significance.   She is scheduled for follow up in December/2025.  -She has had a recent lapse in smoking cessation, having smoked due to stress but plans to restart cessation efforts.   Hx of depression and anxiety, she is not longer seeing psychiatrist.States that provider left the practice over a year ago and she has not established with new provider. She has been prescribed Hydroxyzine  25 mg for anxiety and insomnia and it has helped, requesting refills. She is not longer on Trazodone  or any  antidepressants. No side effects from hydroxyzine  and no issues with sleep once she falls asleep. Sleeps 7-9 hours.     06/26/2024    3:10 PM 11/15/2023    1:36 PM 06/04/2023    3:51 PM 02/14/2023    1:38 PM 12/19/2022   12:29 PM  Depression screen PHQ 2/9  Decreased Interest 1 2 2 3 1   Down, Depressed, Hopeless 0 0 1 3 1   PHQ - 2 Score 1 2 3 6 2   Altered sleeping 3 3 1  0 1  Tired, decreased energy 2 0 1 3 1   Change in appetite 0 0 1 0 1  Feeling bad or failure about yourself  0 0 1 0 0  Trouble concentrating 0 1 0 0 0  Moving slowly or fidgety/restless 3 0 0 0 0  Suicidal thoughts 0 0 0 0 0  PHQ-9 Score 9 6 7 9 5   Difficult doing work/chores Somewhat difficult  Not difficult at all Extremely dIfficult Very difficult      06/26/2024    3:11 PM 06/04/2023    3:51 PM 12/19/2022   12:29 PM 04/19/2021    3:27 PM  GAD 7 : Generalized Anxiety Score  Nervous, Anxious, on Edge 0 0 0 0  Control/stop worrying 0 1 0 0  Worry too much - different things 0 1 0 1  Trouble relaxing 0 0 0 0  Restless 0 0 0 0  Easily annoyed or irritable 0 0 0 3  Afraid - awful might happen 0 0 0 0  Total GAD 7 Score 0 2 0 4  Anxiety Difficulty Not difficult at all Not difficult at all Not difficult at all Not difficult at all  Prediabetes: She does not exercise regularly. She follows a specific diet but occasionally indulges in rich foods, which she acknowledges may contribute to weight gain.  Lab Results  Component Value Date   HGBA1C 6.3 (H) 02/07/2024   Vit D deficiency: Forgetting to take vit D supplementation. Lab Results  Component Value Date   VD25OH 26.4 (L) 02/07/2024   Review of Systems  Constitutional:  Positive for fatigue. Negative for activity change, appetite change and unexpected weight change.  HENT:  Negative for sore throat.   Respiratory:  Negative for cough, shortness of breath and wheezing.   Cardiovascular:  Negative for chest pain and leg swelling.  Gastrointestinal:  Negative  for abdominal pain, nausea and vomiting.  Endocrine: Negative for cold intolerance, heat intolerance, polydipsia, polyphagia and polyuria.  Genitourinary:  Negative for decreased urine volume, dysuria and hematuria.  Musculoskeletal:  Positive for arthralgias and myalgias. Negative for gait problem.  Skin:  Negative for rash.  Neurological:  Negative for syncope and facial asymmetry.  Psychiatric/Behavioral:  Negative for confusion and hallucinations.   See other pertinent positives and negatives in HPI.  Current Outpatient Medications on File Prior to Visit  Medication Sig Dispense Refill   acetaminophen  (TYLENOL ) 500 MG tablet Take 1,000 mg by mouth every 6 (six) hours as needed for mild pain or moderate pain.     antiseptic oral rinse (BIOTENE) LIQD 15 mLs by Mouth Rinse route as needed for dry mouth.     Benzocaine (KANK-A MT) Use as directed 1 Application in the mouth or throat as needed (oral sores).     benzonatate  (TESSALON ) 100 MG capsule Take 1 capsule (100 mg total) by mouth every 8 (eight) hours. 21 capsule 0   buprenorphine  (BUTRANS ) 15 MCG/HR Place 1 patch onto the skin once a week.     DULoxetine  (CYMBALTA ) 60 MG capsule Take 90 mg by mouth every evening.     fluticasone  (FLONASE ) 50 MCG/ACT nasal spray SPRAY 2 SPRAYS INTO EACH NOSTRIL EVERY DAY 48 mL 1   gabapentin  (NEURONTIN ) 600 MG tablet Take 1,200 mg by mouth 3 (three) times daily.     lidocaine  (LIDODERM ) 5 % Place 1 patch onto the skin daily. Remove & Discard patch within 12 hours or as directed by MD 30 patch 1   meloxicam  (MOBIC ) 15 MG tablet Take 15 mg by mouth in the morning.     methocarbamol  (ROBAXIN ) 500 MG tablet Take 500 mg by mouth in the morning and at bedtime.     tobramycin-dexamethasone  (TOBRADEX) ophthalmic solution Apply to eye.     Multiple Vitamin (MULTIVITAMIN WITH MINERALS) TABS tablet Take 1 tablet by mouth daily. One A Day Multivitamin     traZODone  (DESYREL ) 100 MG tablet Take 100 mg by mouth at  bedtime. (Patient not taking: Reported on 06/26/2024)     No current facility-administered medications on file prior to visit.   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   Allergies  Allergen Reactions   Pneumovax 23 [Pneumococcal Vac Polyvalent] Other (See Comments)    Arm mass   Seroquel [Quetiapine Fumarate] Nausea Only    Nausea, headache and lethargy    Social History   Socioeconomic History   Marital status: Divorced    Spouse name: Not on file   Number of children: 3   Years of education: Not on file   Highest education level: Not on file  Occupational History   Not on file  Tobacco Use   Smoking status: Former    Current packs/day: 0.00    Average packs/day: 1 pack/day for 43.2 years (43.2 ttl pk-yrs)    Types: Cigarettes    Start date: 100    Quit date: 12/01/2022    Years since quitting: 1.5    Passive exposure: Current   Smokeless tobacco: Never  Vaping Use   Vaping status: Never Used  Substance and Sexual Activity   Alcohol use: No   Drug use: No   Sexual activity: Not Currently  Other Topics Concern   Not on file  Social History Narrative   Not on file   Social Drivers of Health   Financial Resource Strain: Low Risk  (02/12/2022)   Overall Financial Resource Strain (CARDIA)    Difficulty of Paying Living Expenses: Not hard at all  Food Insecurity: No Food Insecurity (02/14/2023)   Hunger Vital Sign    Worried About Running Out of Food in the Last Year: Never true    Ran Out of Food in the Last Year: Never true  Transportation Needs: No Transportation  Needs (02/14/2023)   PRAPARE - Administrator, Civil Service (Medical): No    Lack of Transportation (Non-Medical): No  Physical Activity: Unknown (02/14/2023)   Exercise Vital Sign    Days of Exercise per Week: 0 days    Minutes of Exercise per Session: Patient unable to answer  Stress: Stress Concern Present (02/14/2023)   [employer] of Occupational Health - Occupational Stress Questionnaire    Feeling of Stress : Very much  Social Connections: Unknown (02/14/2023)   Social Connection and Isolation Panel    Frequency of Communication with Friends and Family: Twice a week    Frequency of Social Gatherings with Friends and Family: Patient unable to answer    Attends Religious Services: Patient unable to answer    Active Member of Clubs or Organizations: Yes    Attends Banker Meetings: Patient unable to answer    Marital Status: Divorced   Today's Vitals   06/26/24 1501  BP: 128/80  Pulse: 70  Resp: 16  Temp: 97.9 F (36.6 C)  Weight: 237 lb 12.8 oz (107.9 kg)  Height: 5' 4 (1.626 m)   Body mass index is 40.82 kg/m. Wt Readings from Last 3 Encounters:  06/26/24 237 lb 12.8 oz (107.9 kg)  01/03/24 228 lb 9.6 oz (103.7 kg)  12/23/23 236 lb 5.3 oz (107.2 kg)   Physical Exam Vitals and nursing note reviewed.  Constitutional:      General: She is not in acute distress.    Appearance: She is well-developed.  HENT:     Head: Normocephalic and atraumatic.     Mouth/Throat:     Mouth: Mucous membranes are moist.     Pharynx: Oropharynx is clear.  Eyes:     Conjunctiva/sclera: Conjunctivae normal.  Cardiovascular:     Rate and Rhythm: Normal rate and regular rhythm.     Pulses:  Dorsalis pedis pulses are 2+ on the right side and 2+ on the left side.     Heart sounds: No murmur heard. Pulmonary:     Effort: Pulmonary effort is normal. No respiratory distress.     Breath sounds: Normal breath sounds.  Abdominal:     Palpations: Abdomen  is soft. There is no hepatomegaly or mass.     Tenderness: There is no abdominal tenderness.  Musculoskeletal:     Comments: No signs of synovitis. Thumbs with mild hyperextension. Not able to touch tip toes with hands.  Lymphadenopathy:     Cervical: No cervical adenopathy.  Skin:    General: Skin is warm.     Findings: No erythema or rash.  Neurological:     General: No focal deficit present.     Mental Status: She is alert and oriented to person, place, and time.     Cranial Nerves: No cranial nerve deficit.     Gait: Gait normal.  Psychiatric:        Mood and Affect: Affect normal. Mood is anxious.    ASSESSMENT AND PLAN: Ms. Mccormack was seen today for follow up and to address some concerns. Lab Results  Component Value Date   NA 142 06/26/2024   CL 104 06/26/2024   K 5.7 (H) 06/26/2024   CO2 23 06/26/2024   BUN 26 06/26/2024   CREATININE 0.80 06/26/2024   EGFR 84 06/26/2024   CALCIUM 10.2 06/26/2024   ALBUMIN 4.2 11/15/2023   GLUCOSE 95 06/26/2024   Lab Results  Component Value Date   HGBA1C 6.8 (H) 06/26/2024   Lab Results  Component Value Date   WBC 10.0 06/26/2024   HGB 15.4 06/26/2024   HCT 46.5 06/26/2024   MCV 97 06/26/2024   PLT 361 06/26/2024   Lab Results  Component Value Date   VD25OH 23.8 (L) 06/26/2024   Prediabetes Assessment & Plan: Last HgA1C 6.5 on 11/15/23 and 6.3 on 02/07/24. Consistency with a healthy life style encouraged.  Further recommendations according to HgA1C result.  Orders: -     Basic metabolic panel with GFR; Future -     CBC; Future -     Hemoglobin A1c; Future  Vitamin D  deficiency, unspecified Assessment & Plan: Has not been consistent with vit D supplementation. Further recommendations according to 25 OH vit D result.  Orders: -     VITAMIN D  25 Hydroxy (Vit-D Deficiency, Fractures); Future  Easy bruisability Chronic, since childhood. Hx and examination do not suggest a serious process.  -     CBC;  Future  Insomnia due to other mental disorder Assessment & Plan: Anxiety aggravates problem. She has been on Hydroxyzine  25 mg 1-2 tabs at bedtime and has helped with problem, not longer following with psychiatrist. Rx sent, no changes. Some side effects of medication discussed. Continue appropriate sleep hygiene.  Orders: -     hydrOXYzine  HCl; Take 1 tablet (25 mg total) by mouth every 6 (six) hours.  Dispense: 60 tablet; Refill: 6  Morbid obesity with body mass index (BMI) of 40.0 to 44.9 in adult [facility]) Assessment & Plan: Has gained some wt since her last visit in 12/2023. She understands the benefits of wt loss as well as adverse effects of obesity. Consistency with healthy diet and physical activity as tolerated encouraged.  Fibromyalgia Assessment & Plan: Follows with pain management. Currently on Methocarbamol  500 mg daily as needed, Meloxicam  15 mg daily, Gabapentin  600 mg 2 aps tid, and buprenorphine  15  mg patch. I do not think handicap sticker is needed. She can ask her pain management provider. Low impact regular physical activity encouraged.  In regard to [facility] Danlos syndrome screening, we discussed dx criteria. Most likely her chronic pain is caused by fibromyalgia. She has had echo done. We discussed treatment on pts with disease, as far as I know tehere is not specific pharmacologic treatment for condition. She decided to hold on testing for now.  I personally spent a total of 41 minutes in the care of the patient today including preparing to see the patient, getting/reviewing separately obtained history, performing a medically appropriate exam/evaluation, counseling and educating, placing orders, and documenting clinical information in the EHR.   Return in about 6 months (around 12/24/2024) for chronic problems.  Roniqua Kintz G. [place], MD  [facility]. [place] office.

## 2024-06-26 NOTE — Assessment & Plan Note (Signed)
 Last HgA1C 6.5 on 11/15/23 and 6.3 on 02/07/24. Consistency with a healthy life style encouraged.  Further recommendations according to HgA1C result.

## 2024-06-26 NOTE — Patient Instructions (Signed)
 A few things to remember from today's visit:  Prediabetes - Plan: CBC, Basic metabolic panel with GFR, Hemoglobin A1c, Hemoglobin A1c, Basic metabolic panel with GFR, CBC  Vitamin D  deficiency, unspecified - Plan: VITAMIN D  25 Hydroxy (Vit-D Deficiency, Fractures), VITAMIN D  25 Hydroxy (Vit-D Deficiency, Fractures)  Resume Hydroxyzine  at bedtime.  If you need refills for medications you take chronically, please call your pharmacy. Do not use My Chart to request refills or for acute issues that need immediate attention. If you send a my chart message, it may take a few days to be addressed, specially if I am not in the office.  Please be sure medication list is accurate. If a new problem present, please set up appointment sooner than planned today.

## 2024-06-27 ENCOUNTER — Encounter: Payer: Self-pay | Admitting: Family Medicine

## 2024-06-27 ENCOUNTER — Ambulatory Visit: Payer: Self-pay | Admitting: Family Medicine

## 2024-06-27 DIAGNOSIS — E1169 Type 2 diabetes mellitus with other specified complication: Secondary | ICD-10-CM

## 2024-06-27 DIAGNOSIS — E875 Hyperkalemia: Secondary | ICD-10-CM

## 2024-06-27 LAB — CBC
Hematocrit: 46.5 % (ref 34.0–46.6)
Hemoglobin: 15.4 g/dL (ref 11.1–15.9)
MCH: 32.2 pg (ref 26.6–33.0)
MCHC: 33.1 g/dL (ref 31.5–35.7)
MCV: 97 fL (ref 79–97)
Platelets: 361 x10E3/uL (ref 150–450)
RBC: 4.78 x10E6/uL (ref 3.77–5.28)
RDW: 11.9 % (ref 11.7–15.4)
WBC: 10 x10E3/uL (ref 3.4–10.8)

## 2024-06-27 LAB — BASIC METABOLIC PANEL WITH GFR
BUN/Creatinine Ratio: 33 — ABNORMAL HIGH (ref 12–28)
BUN: 26 mg/dL (ref 8–27)
CO2: 23 mmol/L (ref 20–29)
Calcium: 10.2 mg/dL (ref 8.7–10.3)
Chloride: 104 mmol/L (ref 96–106)
Creatinine, Ser: 0.8 mg/dL (ref 0.57–1.00)
Glucose: 95 mg/dL (ref 70–99)
Potassium: 5.7 mmol/L — ABNORMAL HIGH (ref 3.5–5.2)
Sodium: 142 mmol/L (ref 134–144)
eGFR: 84 mL/min/1.73

## 2024-06-27 LAB — HEMOGLOBIN A1C
Est. average glucose Bld gHb Est-mCnc: 148 mg/dL
Hgb A1c MFr Bld: 6.8 % — ABNORMAL HIGH (ref 4.8–5.6)

## 2024-06-27 LAB — VITAMIN D 25 HYDROXY (VIT D DEFICIENCY, FRACTURES): Vit D, 25-Hydroxy: 23.8 ng/mL — ABNORMAL LOW (ref 30.0–100.0)

## 2024-06-27 NOTE — Assessment & Plan Note (Signed)
 Has gained some wt since her last visit in 12/2023. She understands the benefits of wt loss as well as adverse effects of obesity. Consistency with healthy diet and physical activity as tolerated encouraged.

## 2024-06-27 NOTE — Assessment & Plan Note (Signed)
 Anxiety aggravates problem. She has been on Hydroxyzine  25 mg 1-2 tabs at bedtime and has helped with problem, not longer following with psychiatrist. Rx sent, no changes. Some side effects of medication discussed. Continue appropriate sleep hygiene.

## 2024-06-27 NOTE — Assessment & Plan Note (Signed)
 Has not been consistent with vit D supplementation. Further recommendations according to 25 OH vit D result.

## 2024-06-27 NOTE — Assessment & Plan Note (Signed)
 Follows with pain management. Currently on Methocarbamol  500 mg daily as needed, Meloxicam  15 mg daily, Gabapentin  600 mg 2 aps tid, and buprenorphine  15 mg patch. I do not think handicap sticker is needed. She can ask her pain management provider. Low impact regular physical activity encouraged.

## 2024-06-30 ENCOUNTER — Telehealth: Payer: Self-pay | Admitting: *Deleted

## 2024-06-30 NOTE — Telephone Encounter (Signed)
 Copied from CRM #[id number]. Topic: Clinical - Lab/Test Results >> Jun 30, 2024 11:39 AM [name] wrote: Reason for CRM: Patient is calling back in for her lab work results, relayed to patient , patient understood but would like to know when she should re check the potassium. Please advise patient.

## 2024-07-06 ENCOUNTER — Other Ambulatory Visit: Payer: Self-pay

## 2024-07-06 DIAGNOSIS — F5105 Insomnia due to other mental disorder: Secondary | ICD-10-CM

## 2024-07-10 ENCOUNTER — Ambulatory Visit (INDEPENDENT_AMBULATORY_CARE_PROVIDER_SITE_OTHER)

## 2024-07-10 VITALS — Ht 64.0 in | Wt 237.0 lb

## 2024-07-10 DIAGNOSIS — Z0000 Encounter for general adult medical examination without abnormal findings: Secondary | ICD-10-CM

## 2024-07-10 NOTE — Addendum Note (Signed)
 Addended by: TANDA ROJELIO ORN on: 07/10/2024 01:37 PM   Modules accepted: Orders

## 2024-07-10 NOTE — Progress Notes (Addendum)
 Subjective:   [name] is a 58 y.o. who presents for a Medicare Wellness preventive visit.  As a reminder, Annual Wellness Visits don't include a physical exam, and some assessments may be limited, especially if this visit is performed virtually. We may recommend an in-person follow-up visit with your provider if needed.  Visit Complete: Virtual I connected with  [name] on 07/10/24 by a audio enabled telemedicine application and verified that I am speaking with the correct person using two identifiers.  Patient Location: Home  Provider Location: Home Office  I discussed the limitations of evaluation and management by telemedicine. The patient expressed understanding and agreed to proceed.  Vital Signs: Because this visit was a virtual/telehealth visit, some criteria may be missing or patient reported. Any vitals not documented were not able to be obtained and vitals that have been documented are patient reported.    Persons Participating in Visit: Patient.  AWV Questionnaire: No: Patient Medicare AWV questionnaire was not completed prior to this visit.  Cardiac Risk Factors include: advanced age (>27men, >13 women)     Objective:    Today's Vitals   07/10/24 1125  Weight: 237 lb (107.5 kg)  Height: 5' 4 (1.626 m)   Body mass index is 40.68 kg/m.     07/10/2024   11:34 AM 07/09/2023    9:59 AM 08/10/2022   12:18 PM 08/06/2022    2:29 PM 07/08/2022    2:36 PM 02/12/2022    1:03 PM 09/26/2021    2:49 PM  Advanced Directives  Does Patient Have a Medical Advance Directive? No No No No No No No  Would patient like information on creating a medical advance directive? No - Patient declined  No - Patient declined Yes (MAU/Ambulatory/Procedural Areas - Information given)   No - Patient declined    Current Medications (verified) Outpatient Encounter Medications as of 07/10/2024  Medication Sig   acetaminophen  (TYLENOL ) 500 MG tablet Take 1,000 mg  by mouth every 6 (six) hours as needed for mild pain or moderate pain.   antiseptic oral rinse (BIOTENE) LIQD 15 mLs by Mouth Rinse route as needed for dry mouth.   Benzocaine (KANK-A MT) Use as directed 1 Application in the mouth or throat as needed (oral sores).   buprenorphine  (BUTRANS ) 15 MCG/HR Place 1 patch onto the skin once a week.   DULoxetine  (CYMBALTA ) 60 MG capsule Take 90 mg by mouth every evening.   fluticasone  (FLONASE ) 50 MCG/ACT nasal spray SPRAY 2 SPRAYS INTO EACH NOSTRIL EVERY DAY   gabapentin  (NEURONTIN ) 600 MG tablet Take 1,200 mg by mouth 3 (three) times daily.   hydrOXYzine  (ATARAX ) 25 MG tablet Take 1 tablet (25 mg total) by mouth every 6 (six) hours.   lidocaine  (LIDODERM ) 5 % Place 1 patch onto the skin daily. Remove & Discard patch within 12 hours or as directed by MD   meloxicam  (MOBIC ) 15 MG tablet Take 15 mg by mouth in the morning.   methocarbamol  (ROBAXIN ) 500 MG tablet Take 500 mg by mouth in the morning and at bedtime.   Multiple Vitamin (MULTIVITAMIN WITH MINERALS) TABS tablet Take 1 tablet by mouth daily. One A Day Multivitamin   tobramycin-dexamethasone  (TOBRADEX) ophthalmic solution Apply to eye.   No facility-administered encounter medications on file as of 07/10/2024.    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   Past Surgical History:  Procedure Laterality Date   APPENDECTOMY  2002   BACK SURGERY  2007   L5 Lt discectiomy    CHOLECYSTECTOMY  2005   DENTAL SURGERY     multiple extraction   KNEE ARTHROSCOPY WITH MENISCAL REPAIR Right 08/10/2022   Procedure: KNEE ARTHROSCOPY WITH ROOT MEDIAL MENISCAL REPAIR;  Surgeon: Sharl Selinda Dover, MD;  Location: [facility];  Service: Orthopedics;  Laterality: Right;  90   NOSE SURGERY  1974   fracture   SPINAL FUSION  2011   rod and screws   TONSILLECTOMY  1977   as a child   TUMOR REMOVAL Left 2002   lipoma under breast   Family History  Problem Relation Age of Onset   Aneurysm Mother    Colon polyps Father    Lung cancer Father    Heart disease Brother        CABG at 18, fatal MI at 39   Heart disease Brother    Colon cancer Neg Hx    Esophageal cancer Neg Hx    Inflammatory bowel disease Neg Hx    Liver disease Neg Hx    Pancreatic cancer Neg Hx    Rectal cancer Neg Hx    Stomach cancer Neg Hx    Celiac disease Neg Hx    Crohn's disease Neg Hx    Social History   Socioeconomic History   Marital status: Divorced    Spouse name: Not on file   Number of children: 3   Years of education: Not on file   Highest education level: Not on file  Occupational History   Not on file  Tobacco Use   Smoking status: Every Day    Current packs/day: 0.00    Average packs/day: 1 pack/day for 43.2 years (43.2 ttl pk-yrs)    Types: Cigarettes    Start date: 64    Last attempt to quit: 12/01/2022    Years since quitting: 1.6    Passive exposure: Current   Smokeless tobacco: Never  Vaping Use   Vaping status: Never Used  Substance and Sexual Activity   Alcohol use: No   Drug use: No   Sexual activity: Not Currently  Other Topics Concern   Not on file  Social History Narrative   Not on file   Social Drivers of Health   Financial Resource Strain: Low Risk  (02/12/2022)   Overall Financial Resource Strain (CARDIA)    Difficulty of Paying Living Expenses: Not hard at all  Food Insecurity: No Food Insecurity (07/10/2024)   Hunger Vital Sign    Worried About  Running Out of Food in the Last Year: Never true    Ran Out of Food in the Last Year: Never true  Transportation Needs: No Transportation Needs (07/10/2024)   PRAPARE - Administrator, Civil Service (Medical): No    Lack of Transportation (Non-Medical): No  Physical Activity: Inactive (07/10/2024)   Exercise Vital Sign    Days  of Exercise per Week: 0 days    Minutes of Exercise per Session: 0 min  Stress: No Stress Concern Present (07/10/2024)   [employer] of Occupational Health - Occupational Stress Questionnaire    Feeling of Stress: Not at all  Social Connections: Socially Isolated (07/10/2024)   Social Connection and Isolation Panel    Frequency of Communication with Friends and Family: More than three times a week    Frequency of Social Gatherings with Friends and Family: More than three times a week    Attends Religious Services: Never    Database administrator or Organizations: No    Attends Engineer, structural: Never    Marital Status: Divorced    Tobacco Counseling Ready to quit: Not Answered Counseling given: Not Answered    Clinical Intake:  Pre-visit preparation completed: Yes  Pain : No/denies pain     BMI - recorded: 40.68 Nutritional Status: BMI > 30  Obese Nutritional Risks: None Diabetes: No  Lab Results  Component Value Date   HGBA1C 6.8 (H) 06/26/2024   HGBA1C 6.3 (H) 02/07/2024   HGBA1C 6.5 11/15/2023     How often do you need to have someone help you when you read instructions, pamphlets, or other written materials from your doctor or pharmacy?: 3 - Sometimes (Daughter assist)  Interpreter Needed?: No  Information entered by :: Gail Blush LPN   Activities of Daily Living      07/10/2024   11:32 AM  In your present state of health, do you have any difficulty performing the following activities:  Hearing? 0  Vision? 0  Difficulty concentrating or making decisions? 0  Walking or climbing stairs? 1   Comment Uses a Cane  Dressing or bathing? 0  Doing errands, shopping? 0  Preparing Food and eating ? N  Using the Toilet? N  In the past six months, have you accidently leaked urine? N  Do you have problems with loss of bowel control? N  Managing your Medications? N  Managing your Finances? N  Housekeeping or managing your Housekeeping? N    Patient Care Team: [place], Betty G, MD as PCP - General (Family Medicine)   I have updated your Care Teams any recent Medical Services you may have received from other providers in the past year.     Assessment:   This is a routine wellness examination for [name].  Hearing/Vision screen Hearing Screening - Comments:: Denies hearing difficulties   Vision Screening - Comments:: Wears rx glasses - up to date with routine eye exams with  Dr Modesto   Goals Addressed               This Visit's Progress     Survive (pt-stated)                Depression Screen      07/10/2024   11:32 AM 06/26/2024    3:10 PM 11/15/2023    1:36 PM 06/04/2023    3:51 PM 02/14/2023    1:38 PM 12/19/2022   12:29 PM 12/19/2022   11:59 AM  PHQ 2/9 Scores  PHQ - 2 Score 0 1 2 3 6 2  0  PHQ- 9 Score 0 9 6 7 9 5      Fall Risk      07/10/2024   11:33 AM 06/04/2023    3:51 PM 02/14/2023    1:37 PM 12/19/2022   11:58 AM 09/11/2022    3:34 PM  Fall Risk  Falls in the past year? 0 0 0 0 0  Number falls in past yr: 0 0 0 0 0  Injury with Fall? 0 0 0 0 0  Risk for fall due to : No Fall Risks  Other (Comment) No Fall Risks History of fall(s)  Follow up Falls evaluation completed Falls evaluation completed Falls evaluation completed Falls evaluation completed Falls evaluation completed      Data saved with a previous flowsheet row definition    MEDICARE RISK AT HOME:   Medicare Risk at Home Any stairs in or around the home?: No If so, are there any without handrails?: No Home free of loose throw rugs in walkways, pet beds, electrical cords, etc?:  Yes Adequate lighting in your home to reduce risk of falls?: Yes Life alert?: No Use of a cane, walker or w/c?: Yes Grab bars in the bathroom?: Yes Shower chair or bench in shower?: Yes Elevated toilet seat or a handicapped toilet?: Yes  TIMED UP AND GO:  Was the test performed?  No  Cognitive Function: 6CIT completed        07/10/2024   11:34 AM 02/12/2022    1:06 PM  6CIT Screen  What Year? 0 points 0 points  What month? 0 points 0 points  What time? 0 points 3 points  Count back from 20 0 points 0 points  Months in reverse 0 points 0 points  Repeat phrase 0 points 0 points  Total Score 0 points 3 points    Immunizations Immunization History  Administered Date(s) Administered   Influenza, Quadrivalent, Recombinant, Inj, Pf 09/20/2020   Influenza,inj,Quad PF,6+ Mos 06/09/2021, 07/13/2022   Influenza-Unspecified 07/12/2020   PFIZER(Purple Top)SARS-COV-2 Vaccination 12/17/2019, 01/11/2020, 07/12/2020, 10/12/2022   Pneumococcal Polysaccharide-23 06/09/2021   Td 05/01/2006   Zoster Recombinant(Shingrix) 05/03/2021    Screening Tests Health Maintenance  Topic Date Due   Diabetic kidney evaluation - Urine ACR  Never done   DTaP/Tdap/Td (2 - Tdap) 05/01/2016   Zoster Vaccines- Shingrix (2 of 2) 06/28/2021   Pneumococcal Vaccine: 50+ Years (2 of 2 - PCV) 06/09/2022   Influenza Vaccine  05/01/2024   COVID-19 Vaccine (5 - 2025-26 season) 06/01/2024   Mammogram  06/21/2024   Lung Cancer Screening  12/22/2024   Colonoscopy  01/17/2025   Diabetic kidney evaluation - eGFR measurement  06/26/2025   Medicare Annual Wellness (AWV)  07/10/2025   Cervical Cancer Screening (HPV/Pap Cotest)  03/16/2027   Hepatitis C Screening  Completed   HIV Screening  Completed   Hepatitis B Vaccines 19-59 Average Risk  Aged Out   HPV VACCINES  Aged Out   Meningococcal B Vaccine  Aged Out    Health Maintenance Items Addressed:   Additional Screening:  Vision Screening: Recommended  annual ophthalmology exams for early detection of glaucoma and other disorders of the eye. Is the patient up to date with their annual eye exam?  Yes  Who is the provider or what is the name of the office in which the patient attends annual eye exams? Dr Modesto  Dental Screening: Recommended annual dental exams for proper oral hygiene  Community Resource Referral / Chronic Care Management: CRR required this visit?  No   CCM required this visit?  No   Plan:    I have personally reviewed and noted the following in the patient's chart:   Medical and social history Use of alcohol, tobacco or illicit drugs  Current medications and supplements including opioid prescriptions. Patient is not currently taking  opioid prescriptions. Functional ability and status Nutritional status Physical activity Advanced directives List of other physicians Hospitalizations, surgeries, and ER visits in previous 12 months Vitals Screenings to include cognitive, depression, and falls Referrals and appointments  In addition, I have reviewed and discussed with patient certain preventive protocols, quality metrics, and best practice recommendations. A written personalized care plan for preventive services as well as general preventive health recommendations were provided to patient.   Gail LELON Blush, LPN   [id number]   After Visit Summary: (MyChart) Due to this being a telephonic visit, the after visit summary with patients personalized plan was offered to patient via MyChart   Notes: Nothing significant to report at this time.

## 2024-07-10 NOTE — Patient Instructions (Addendum)
 [name],  Thank you for taking the time for your Medicare Wellness Visit. I appreciate your continued commitment to your health goals. Please review the care plan we discussed, and feel free to reach out if I can assist you further.  Medicare recommends these wellness visits once per year to help you and your care team stay ahead of potential health issues. These visits are designed to focus on prevention, allowing your provider to concentrate on managing your acute and chronic conditions during your regular appointments.  Please note that Annual Wellness Visits do not include a physical exam. Some assessments may be limited, especially if the visit was conducted virtually. If needed, we may recommend a separate in-person follow-up with your provider.  Ongoing Care Seeing your primary care provider every 3 to 6 months helps us  monitor your health and provide consistent, personalized care.   Referrals If a referral was made during today's visit and you haven't received any updates within two weeks, please contact the referred provider directly to check on the status.  Recommended Screenings:  Health Maintenance  Topic Date Due   Yearly kidney health urinalysis for diabetes  Never done   DTaP/Tdap/Td vaccine (2 - Tdap) 05/01/2016   Zoster (Shingles) Vaccine (2 of 2) 06/28/2021   Pneumococcal Vaccine for age over 30 (2 of 2 - PCV) 06/09/2022   Flu Shot  05/01/2024   COVID-19 Vaccine (5 - 2025-26 season) 06/01/2024   Breast Cancer Screening  06/21/2024   Screening for Lung Cancer  12/22/2024   Colon Cancer Screening  01/17/2025   Yearly kidney function blood test for diabetes  06/26/2025   Medicare Annual Wellness Visit  07/10/2025   Pap with HPV screening  03/16/2027   Hepatitis C Screening  Completed   HIV Screening  Completed   Hepatitis B Vaccine  Aged Out   HPV Vaccine  Aged Out   Meningitis B Vaccine  Aged Out       07/10/2024   11:34 AM  Advanced Directives  Does  Patient Have a Medical Advance Directive? No  Would patient like information on creating a medical advance directive? No - Patient declined   Advance Care Planning is important because it: Ensures you receive medical care that aligns with your values, goals, and preferences. Provides guidance to your family and loved ones, reducing the emotional burden of decision-making during critical moments.  Vision: Annual vision screenings are recommended for early detection of glaucoma, cataracts, and diabetic retinopathy. These exams can also reveal signs of chronic conditions such as diabetes and high blood pressure.  Dental: Annual dental screenings help detect early signs of oral cancer, gum disease, and other conditions linked to overall health, including heart disease and diabetes.  Please see the attached documents for additional preventive care recommendations.

## 2024-07-31 DIAGNOSIS — G894 Chronic pain syndrome: Secondary | ICD-10-CM | POA: Diagnosis not present

## 2024-07-31 DIAGNOSIS — E882 Lipomatosis, not elsewhere classified: Secondary | ICD-10-CM | POA: Diagnosis not present

## 2024-07-31 DIAGNOSIS — M797 Fibromyalgia: Secondary | ICD-10-CM | POA: Diagnosis not present

## 2024-07-31 DIAGNOSIS — Z79899 Other long term (current) drug therapy: Secondary | ICD-10-CM | POA: Diagnosis not present

## 2024-08-25 MED ORDER — HYDROXYZINE HCL 25 MG PO TABS: 25.0000 mg | ORAL_TABLET | Freq: Every evening | ORAL | 1 refills | Status: AC | PRN

## 2024-10-09 ENCOUNTER — Ambulatory Visit: Admitting: Family Medicine

## 2024-10-09 ENCOUNTER — Encounter: Payer: Self-pay | Admitting: Family Medicine

## 2024-10-09 VITALS — BP 118/78 | HR 71 | Temp 97.7°F | Resp 16 | Ht 64.0 in | Wt 238.4 lb

## 2024-10-09 DIAGNOSIS — Z6841 Body Mass Index (BMI) 40.0 and over, adult: Secondary | ICD-10-CM

## 2024-10-09 DIAGNOSIS — F99 Mental disorder, not otherwise specified: Secondary | ICD-10-CM | POA: Diagnosis not present

## 2024-10-09 DIAGNOSIS — G894 Chronic pain syndrome: Secondary | ICD-10-CM | POA: Diagnosis not present

## 2024-10-09 DIAGNOSIS — E559 Vitamin D deficiency, unspecified: Secondary | ICD-10-CM

## 2024-10-09 DIAGNOSIS — F5105 Insomnia due to other mental disorder: Secondary | ICD-10-CM | POA: Diagnosis not present

## 2024-10-09 DIAGNOSIS — E1169 Type 2 diabetes mellitus with other specified complication: Secondary | ICD-10-CM

## 2024-10-09 DIAGNOSIS — E782 Mixed hyperlipidemia: Secondary | ICD-10-CM

## 2024-10-09 DIAGNOSIS — Z23 Encounter for immunization: Secondary | ICD-10-CM

## 2024-10-09 DIAGNOSIS — E119 Type 2 diabetes mellitus without complications: Secondary | ICD-10-CM | POA: Insufficient documentation

## 2024-10-09 LAB — POCT GLYCOSYLATED HEMOGLOBIN (HGB A1C): Hemoglobin A1C: 6.5 % — AB (ref 4.0–5.6)

## 2024-10-09 LAB — LIPID PANEL
Cholesterol: 181 mg/dL (ref 28–200)
HDL: 46.9 mg/dL
LDL Cholesterol: 101 mg/dL — ABNORMAL HIGH (ref 10–99)
NonHDL: 133.71
Total CHOL/HDL Ratio: 4
Triglycerides: 165 mg/dL — ABNORMAL HIGH (ref 10.0–149.0)
VLDL: 33 mg/dL (ref 0.0–40.0)

## 2024-10-09 LAB — BASIC METABOLIC PANEL WITH GFR
BUN: 22 mg/dL (ref 6–23)
CO2: 29 meq/L (ref 19–32)
Calcium: 9.3 mg/dL (ref 8.4–10.5)
Chloride: 103 meq/L (ref 96–112)
Creatinine, Ser: 0.76 mg/dL (ref 0.40–1.20)
GFR: 84.84 mL/min
Glucose, Bld: 116 mg/dL — ABNORMAL HIGH (ref 70–99)
Potassium: 4.8 meq/L (ref 3.5–5.1)
Sodium: 140 meq/L (ref 135–145)

## 2024-10-09 LAB — VITAMIN D 25 HYDROXY (VIT D DEFICIENCY, FRACTURES): VITD: 23.36 ng/mL — ABNORMAL LOW (ref 30.00–100.00)

## 2024-10-09 LAB — MICROALBUMIN / CREATININE URINE RATIO
Creatinine,U: 160.3 mg/dL
Microalb Creat Ratio: 7.2 mg/g (ref 0.0–30.0)
Microalb, Ur: 1.2 mg/dL (ref 0.7–1.9)

## 2024-10-09 MED ORDER — ACCU-CHEK AVIVA PLUS W/DEVICE KIT: PACK | 0 refills | Status: AC

## 2024-10-09 NOTE — Assessment & Plan Note (Signed)
 Problem is not well-controlled, interfering with ADLs. I do not think she needs a scooter, which she requested today. Encouraged low impact physical activity. Currently she is following with pain management.

## 2024-10-09 NOTE — Assessment & Plan Note (Signed)
 Currently she is on OTC vitamin D  supplementation, she is not sure about dose but acknowledges she has not been consistent with taking it daily. Further recommendation will be given according to 25 [place] vitamin D  result.

## 2024-10-09 NOTE — Assessment & Plan Note (Signed)
 HgA1C at goal, 6.5 today. Continue nonpharmacologic treatment. Regular exercise and healthy diet with avoidance of added sugar food intake is an important part of treatment and recommended. Annual eye exam, periodic dental and foot care recommended. F/U in 5-6 months.

## 2024-10-09 NOTE — Progress Notes (Unsigned)
 "   Chief Complaint  Patient presents with   Diabetes    Referral to see a nerve specialist , Pt would like a power chair rx     Discussed the use of AI scribe software for clinical note transcription with the patient, who gave verbal consent to proceed. History of Present Illness [name] is a 59 year old female with past medical history significant for fibromyalgia, Dercum's disease, DM 2, hyperlipidemia, vitamin D  deficiency, and depression/anxiety, who is here today for chronic disease management. Last seen on 06/26/2024.  Chronic pain: She mentions difficulty managing her with diet and exercise due to pain, and describes a lack of quality of life, stating 'I don't leave my room.' She describes significant pain impacting her daily activities, stating that even simple tasks like cooking or grocery shopping require frequent breaks. Her pain level is between 7 and 9 on a scale, and it interferes with her daily life, including standing for prolonged periods or walking. She is still attending pain management and reports worsening pain over the last six months. Requesting prescription for a scooter.  Anxiety and insomnia: She continues to see a therapist every two weeks and takes hydroxyzine  25 mg 1-2 tabs at bedtime to aid sleep, which she reports is effective, allowing her to sleep 7 to 9 hours at night with additional naps during the day if she exerts herself. *** She is smoking 2 to 5 cigarettes a day due to pain.  She reports a history of nerve pain in the left upper back, described as a dull pain with occasional 'electrical shocks.' This pain is intermittent but has been persistent since November. She recalls a previous diagnosis of nerve damage from an electromyography test conducted a few years ago.***  Since her last visit she has followed with orthopedist, pain management,and cardiologist. 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   Lab Results  Component Value Date   CREATININE 0.80 06/26/2024   BUN 26 06/26/2024   NA 142 06/26/2024   K 5.7 (H) 06/26/2024   CL 104 06/26/2024   CO2 23 06/26/2024   Diabetes Mellitus II: Diagnosed in 11/2023 with a hemoglobin A1c of 6.5. Currently she is on nonpharmacologic treatment. She has made dietary changes by reducing sugar intake, stopping ice cream, and increasing fish and vegetable consumption. However, she continues to consume rice, beans, bread, and pasta regularly.  She believes it might be the same or higher now, especially after the holidays. She has not been checking her blood sugars but thinks she may need insulin.  Negative for symptoms of hypoglycemia, polyuria, polydipsia, numbness extremities, foot ulcers/trauma. She has not had a recent eye exam but plans to see her eye care provider in February.  Lab Results  Component Value Date   HGBA1C 6.8 (H) 06/26/2024   Hyperlipidemia: Currently she denies any pharmacologic treatment.  Lab Results  Component Value Date   CHOL 183 06/04/2023   HDL 45.50 06/04/2023   LDLCALC 102 (H) 06/04/2023   TRIG 175.0 (H) 06/04/2023   CHOLHDL 4 06/04/2023   Vitamin D  deficiency: Currently she is on vitamin D  supplementation but she is not taking medication consistently.  Lab Results  Component Value Date   VD25OH 23.8 (L) 06/26/2024   Review of Systems  Constitutional:  Positive for fatigue. Negative for activity change, appetite change, chills and fever.  HENT:  Negative for sore throat.   Respiratory:  Negative for cough and wheezing.   Cardiovascular:  Negative for chest pain and leg swelling.  Gastrointestinal:  Negative for abdominal pain, nausea and vomiting.  Endocrine: Negative for cold intolerance and heat intolerance.  Genitourinary:  Negative for decreased urine volume, dysuria and hematuria.  Musculoskeletal:  Positive for arthralgias and myalgias.  Skin:  Negative for rash.   Neurological:  Negative for syncope, facial asymmetry and weakness.  Psychiatric/Behavioral:  Negative for confusion and hallucinations. The patient is nervous/anxious.   See other pertinent positives and negatives in HPI.  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   Allergies[2]  Social History   Socioeconomic History   Marital status: Divorced    Spouse name: Not on file   Number of children: 3   Years of education: Not on file   Highest education level: Not on file  Occupational History   Not on file  Tobacco Use   Smoking status: Some Days    Current packs/day: 1.00    Average packs/day: 1 pack/day for 46.0 years (45.5 ttl pk-yrs)    Types: Cigarettes    Start date: 3    Passive exposure: Current   Smokeless tobacco: Never  Vaping Use   Vaping status: Never Used  Substance and Sexual Activity   Alcohol use: No   Drug use: No   Sexual activity: Not Currently  Other Topics Concern   Not on file  Social History Narrative   Not on file   Social Drivers of Health   Tobacco Use: High Risk (10/09/2024)   Patient History    Smoking Tobacco Use: Some Days    Smokeless Tobacco Use: Never    Passive Exposure: Current  Financial Resource Strain: Low Risk (02/12/2022)   Overall Financial Resource Strain (CARDIA)    Difficulty of Paying Living Expenses: Not hard at all  Food Insecurity: No Food Insecurity (07/10/2024)   Epic    Worried About Programme Researcher, Broadcasting/film/video in the Last Year: Never true    Ran Out of Food in the Last Year:  Never true  Transportation Needs: No Transportation Needs (07/10/2024)   Epic    Lack of Transportation (Medical): No    Lack of Transportation (Non-Medical): No  Physical Activity: Inactive (07/10/2024)   Exercise Vital Sign    Days of Exercise per Week: 0 days    Minutes of Exercise per Session: 0 min  Stress: No Stress Concern Present (07/10/2024)   [employer] of Occupational Health - Occupational Stress Questionnaire    Feeling of Stress: Not at all  Social Connections: Socially Isolated (07/10/2024)   Social Connection and Isolation Panel    Frequency of Communication with Friends and Family: More than three times a week    Frequency of Social Gatherings with Friends and Family: More than three times a week    Attends Religious Services: Never    Database Administrator or Organizations: No  Attends Banker Meetings: Never    Marital Status: Divorced  Depression (PHQ2-9): Medium Risk (10/09/2024)   Depression (PHQ2-9)    PHQ-2 Score: 6  Alcohol Screen: Low Risk (07/10/2024)   Alcohol Screen    Last Alcohol Screening Score (AUDIT): 0  Housing: Unknown (07/10/2024)   Epic    Unable to Pay for Housing in the Last Year: No    Number of Times Moved in the Last Year: Not on file    Homeless in the Last Year: No  Utilities: Not At Risk (07/10/2024)   Epic    Threatened with loss of utilities: No  Health Literacy: Adequate Health Literacy (07/10/2024)   B1300 Health Literacy    Frequency of need for help with medical instructions: Never   Today's Vitals   10/09/24 0714  BP: 118/78  Pulse: 71  Resp: 16  Temp: 97.7 F (36.5 C)  SpO2: 92%  Weight: 238 lb 6.4 oz (108.1 kg)  Height: 5' 4 (1.626 m)   Wt Readings from Last 3 Encounters:  10/09/24 238 lb 6.4 oz (108.1 kg)  07/10/24 237 lb (107.5 kg)  06/26/24 237 lb 12.8 oz (107.9 kg)   Body mass index is 40.92 kg/m.  Physical Exam Vitals and nursing note reviewed.  Constitutional:      General:  She is not in acute distress.    Appearance: She is well-developed.  HENT:     Head: Normocephalic and atraumatic.     Mouth/Throat:     Mouth: Mucous membranes are dry.     Pharynx: Oropharynx is clear.  Eyes:     Conjunctiva/sclera: Conjunctivae normal.  Cardiovascular:     Rate and Rhythm: Normal rate and regular rhythm.     Pulses:          Dorsalis pedis pulses are 2+ on the right side and 2+ on the left side.     Heart sounds: No murmur heard. Pulmonary:     Effort: Pulmonary effort is normal. No respiratory distress.     Breath sounds: Normal breath sounds.  Abdominal:     Palpations: Abdomen is soft. There is no hepatomegaly or mass.     Tenderness: There is no abdominal tenderness.  Musculoskeletal:     Comments: Tenderness with light touch of chest wall and along back muscles.  Lymphadenopathy:     Cervical: No cervical adenopathy.  Skin:    General: Skin is warm.     Findings: No erythema or rash.  Neurological:     General: No focal deficit present.     Mental Status: She is alert and oriented to person, place, and time.     Gait: Gait normal.  Psychiatric:        Mood and Affect: Mood is anxious.   ASSESSMENT AND PLAN:  [name] was seen today for diabetes.  Diagnoses and all orders for this visit:  Orders Placed This Encounter  Procedures   Microalbumin / creatinine urine ratio   Basic metabolic panel with GFR   VITAMIN D  25 Hydroxy (Vit-D Deficiency, Fractures)   Lipid panel   POC HgB A1c   Lab Results  Component Value Date   HGBA1C 6.5 (A) 10/09/2024  *** Type 2 diabetes mellitus with other specified complication, without long-term current use of insulin (HCC) Assessment & Plan: HgA1C at goal, 6.5 today. Continue nonpharmacologic treatment. Regular exercise and healthy diet with avoidance of added sugar food intake is an important part of treatment and recommended. Annual  eye exam, periodic dental and foot care recommended. F/U in  5-6 months.  Orders: -     Microalbumin / creatinine urine ratio; Future -     POCT glycosylated hemoglobin (Hb A1C) -     Basic metabolic panel with GFR; Future -     Accu-Chek Aviva Plus; As directed.  Dispense: 1 kit; Refill: 0  Vitamin D  deficiency, unspecified Assessment & Plan: Currently she is on OTC vitamin D  supplementation, she is not sure about dose but acknowledges she has not been consistent with taking it daily. Further recommendation will be given according to 25 OH vitamin D  result.  Orders: -     Basic metabolic panel with GFR; Future -     VITAMIN D  25 Hydroxy (Vit-D Deficiency, Fractures); Future  Insomnia due to other mental disorder Assessment & Plan: Related to anxiety. She is following with psychotherapist regularly. Hydroxyzine  25 mg 1 to 2 tablets at bedtime has helped.   Morbid obesity with body mass index (BMI) of 40.0 to 44.9 in adult [facility]) Assessment & Plan: Comorbidities: Chronic pain/OA, anxiety/depression, hyperlipidemia, and DM2. Weight is otherwise stable. She has made some positive dietary changes, decreased sugar intake. Consistency with healthy diet and physical activity encouraged.   Chronic pain syndrome Assessment & Plan: Problem is not well-controlled, interfering with ADLs. I do not think she needs a scooter, which she requested today. Encouraged low impact physical activity. Currently she is following with pain management.   Mixed hyperlipidemia Assessment & Plan: We discussed current recommendations in regard to pharmacologic treatment.  Currently she is not on a statin. Last LDL 102 in 06/2023. Further recommendation will be given according to lipid panel result.  Orders: -     Lipid panel; Future  Immunization due -     Pneumococcal conjugate vaccine 20-valent   I personally spent a total of 43 minutes in the care of the patient today including preparing to see the patient, getting/reviewing separately obtained history,  performing a medically appropriate exam/evaluation, counseling and educating, placing orders, documenting clinical information in the EHR, and communicating results.  Return in about 6 months (around 04/07/2025) for chronic problems.  Willett Lefeber, MD [facility]. [place] office.      [1]  Current Outpatient Medications on File Prior to Visit  Medication Sig Dispense Refill   acetaminophen  (TYLENOL ) 500 MG tablet Take 1,000 mg by mouth every 6 (six) hours as needed for mild pain or moderate pain.     antiseptic oral rinse (BIOTENE) LIQD 15 mLs by Mouth Rinse route as needed for dry mouth.     Benzocaine (KANK-A MT) Use as directed 1 Application in the mouth or throat as needed (oral sores).     buprenorphine  (BUTRANS ) 15 MCG/HR Place 1 patch onto the skin once a week.     fluticasone  (FLONASE ) 50 MCG/ACT nasal spray SPRAY 2 SPRAYS INTO EACH NOSTRIL EVERY DAY 48 mL 1   gabapentin  (NEURONTIN ) 600 MG tablet Take 1,200 mg by mouth 3 (three) times daily.     hydrOXYzine  (ATARAX ) 25 MG tablet Take 1-2 tablets (25-50 mg total) by mouth at bedtime as needed. 180 tablet 1   lidocaine  (LIDODERM ) 5 % Place 1 patch onto the skin daily. Remove & Discard patch within 12 hours or as directed by MD 30 patch 1   meloxicam  (MOBIC ) 15 MG tablet Take 15 mg by mouth in the morning.     methocarbamol  (ROBAXIN ) 500 MG tablet Take 500 mg by mouth in  the morning and at bedtime.     Multiple Vitamin (MULTIVITAMIN WITH MINERALS) TABS tablet Take 1 tablet by mouth daily. One A Day Multivitamin     tobramycin-dexamethasone  (TOBRADEX) ophthalmic solution Apply to eye.     DULoxetine  (CYMBALTA ) 60 MG capsule Take 90 mg by mouth every evening. (Patient not taking: Reported on 10/09/2024)     No current facility-administered medications on file prior to visit.  [2]  Allergies Allergen Reactions   Pneumovax 23 [Pneumococcal Vac Polyvalent] Other (See Comments)    Arm mass   Seroquel [Quetiapine Fumarate]  Nausea Only    Nausea, headache and lethargy   "

## 2024-10-09 NOTE — Assessment & Plan Note (Signed)
 Comorbidities: Chronic pain/OA, anxiety/depression, hyperlipidemia, and DM2. Weight is otherwise stable. She has made some positive dietary changes, decreased sugar intake. Consistency with healthy diet and physical activity encouraged.

## 2024-10-09 NOTE — Patient Instructions (Addendum)
 A few things to remember from today's visit:  Type 2 diabetes mellitus with other specified complication, without long-term current use of insulin (HCC) - Plan: Microalbumin / creatinine urine ratio, POC HgB A1c, Basic metabolic panel with GFR, Blood Glucose Monitoring Suppl (ACCU-CHEK AVIVA PLUS) w/Device KIT  Vitamin D  deficiency, unspecified - Plan: Basic metabolic panel with GFR, VITAMIN D  25 Hydroxy (Vit-D Deficiency, Fractures)  Insomnia due to other mental disorder  Morbid obesity with body mass index (BMI) of 40.0 to 44.9 in adult [facility])  Chronic pain syndrome  Mixed hyperlipidemia - Plan: Lipid panel  No changes today. Try to walk in place for 15 min daily. Keep appt with pain management. Cholesterol needs to be lower.  If you need refills for medications you take chronically, please call your pharmacy. Do not use My Chart to request refills or for acute issues that need immediate attention. If you send a my chart message, it may take a few days to be addressed, specially if I am not in the office.  Please be sure medication list is accurate. If a new problem present, please set up appointment sooner than planned today.

## 2024-10-09 NOTE — Assessment & Plan Note (Addendum)
 We discussed current recommendations in regard to pharmacologic treatment.  Currently she is not on a statin. Last LDL 102 in 06/2023. Further recommendation will be given according to lipid panel result.

## 2024-10-09 NOTE — Assessment & Plan Note (Signed)
 Related to anxiety. She is following with psychotherapist regularly. Hydroxyzine  25 mg 1 to 2 tablets at bedtime has helped.

## 2024-10-10 ENCOUNTER — Ambulatory Visit: Payer: Self-pay | Admitting: Family Medicine

## 2024-10-12 MED ORDER — ROSUVASTATIN CALCIUM 5 MG PO TABS: 5.0000 mg | ORAL_TABLET | Freq: Every day | ORAL | 3 refills | Status: AC

## 2024-10-12 MED ORDER — ERGOCALCIFEROL 1.25 MG (50000 UT) PO CAPS: ORAL_CAPSULE | ORAL | 1 refills | Status: AC

## 2024-10-12 NOTE — Telephone Encounter (Signed)
 Medication has been sent in to the pharmacy. Per patient she wanted the 50,000 unit vitamin D . Discussed with Dr. Jordan and she agreed on 50,000 units every other week.

## 2024-10-12 NOTE — Telephone Encounter (Signed)
 Patient has been informed of the Vitamin D  50,000 every other week.

## 2024-10-30 ENCOUNTER — Encounter: Payer: Self-pay | Admitting: Acute Care

## 2025-07-16 ENCOUNTER — Ambulatory Visit
